# Patient Record
Sex: Female | Born: 1965 | Race: Black or African American | Hispanic: No | Marital: Married | Smoking: Never smoker
Health system: Southern US, Community
[De-identification: ages and names within clinical notes are randomized; demographics above are authoritative.]

## PROBLEM LIST (undated history)

## (undated) DIAGNOSIS — J40 Bronchitis, not specified as acute or chronic: Secondary | ICD-10-CM

## (undated) DIAGNOSIS — F419 Anxiety disorder, unspecified: Secondary | ICD-10-CM

## (undated) DIAGNOSIS — K219 Gastro-esophageal reflux disease without esophagitis: Secondary | ICD-10-CM

## (undated) DIAGNOSIS — M1990 Unspecified osteoarthritis, unspecified site: Secondary | ICD-10-CM

## (undated) DIAGNOSIS — J449 Chronic obstructive pulmonary disease, unspecified: Secondary | ICD-10-CM

## (undated) DIAGNOSIS — G709 Myoneural disorder, unspecified: Secondary | ICD-10-CM

## (undated) DIAGNOSIS — I10 Essential (primary) hypertension: Secondary | ICD-10-CM

## (undated) DIAGNOSIS — E669 Obesity, unspecified: Secondary | ICD-10-CM

## (undated) DIAGNOSIS — G4730 Sleep apnea, unspecified: Secondary | ICD-10-CM

## (undated) DIAGNOSIS — T7840XA Allergy, unspecified, initial encounter: Secondary | ICD-10-CM

## (undated) DIAGNOSIS — E119 Type 2 diabetes mellitus without complications: Secondary | ICD-10-CM

## (undated) DIAGNOSIS — F32A Depression, unspecified: Secondary | ICD-10-CM

## (undated) HISTORY — DX: Sleep apnea, unspecified: G47.30

## (undated) HISTORY — DX: Anxiety disorder, unspecified: F41.9

## (undated) HISTORY — DX: Essential (primary) hypertension: I10

## (undated) HISTORY — DX: Chronic obstructive pulmonary disease, unspecified: J44.9

## (undated) HISTORY — DX: Type 2 diabetes mellitus without complications: E11.9

## (undated) HISTORY — DX: Unspecified osteoarthritis, unspecified site: M19.90

## (undated) HISTORY — DX: Gastro-esophageal reflux disease without esophagitis: K21.9

## (undated) HISTORY — DX: Myoneural disorder, unspecified: G70.9

## (undated) HISTORY — DX: Obesity, unspecified: E66.9

## (undated) HISTORY — PX: DENTAL SURGERY: SHX609

## (undated) HISTORY — DX: Bronchitis, not specified as acute or chronic: J40

## (undated) HISTORY — DX: Allergy, unspecified, initial encounter: T78.40XA

## (undated) HISTORY — DX: Depression, unspecified: F32.A

---

## 1998-09-08 ENCOUNTER — Emergency Department (HOSPITAL_COMMUNITY): Admission: EM | Admit: 1998-09-08 | Discharge: 1998-09-08 | Payer: Self-pay | Admitting: Emergency Medicine

## 2000-03-03 ENCOUNTER — Other Ambulatory Visit: Admission: RE | Admit: 2000-03-03 | Discharge: 2000-03-03 | Payer: Self-pay | Admitting: Obstetrics and Gynecology

## 2000-03-03 ENCOUNTER — Other Ambulatory Visit: Admission: RE | Admit: 2000-03-03 | Discharge: 2000-03-03 | Payer: Self-pay | Admitting: Orthopedic Surgery

## 2002-05-04 ENCOUNTER — Other Ambulatory Visit: Admission: RE | Admit: 2002-05-04 | Discharge: 2002-05-04 | Payer: Self-pay | Admitting: Obstetrics and Gynecology

## 2003-03-24 ENCOUNTER — Emergency Department (HOSPITAL_COMMUNITY): Admission: EM | Admit: 2003-03-24 | Discharge: 2003-03-24 | Payer: Self-pay | Admitting: Emergency Medicine

## 2003-03-27 ENCOUNTER — Emergency Department (HOSPITAL_COMMUNITY): Admission: EM | Admit: 2003-03-27 | Discharge: 2003-03-27 | Payer: Self-pay | Admitting: Emergency Medicine

## 2003-09-14 ENCOUNTER — Other Ambulatory Visit: Admission: RE | Admit: 2003-09-14 | Discharge: 2003-09-14 | Payer: Self-pay | Admitting: Obstetrics and Gynecology

## 2004-03-26 ENCOUNTER — Other Ambulatory Visit: Admission: RE | Admit: 2004-03-26 | Discharge: 2004-03-26 | Payer: Self-pay | Admitting: Obstetrics and Gynecology

## 2004-10-14 HISTORY — PX: ECTOPIC PREGNANCY SURGERY: SHX613

## 2004-12-07 ENCOUNTER — Encounter: Payer: Self-pay | Admitting: Emergency Medicine

## 2004-12-07 ENCOUNTER — Encounter (INDEPENDENT_AMBULATORY_CARE_PROVIDER_SITE_OTHER): Payer: Self-pay | Admitting: Specialist

## 2004-12-07 ENCOUNTER — Ambulatory Visit (HOSPITAL_COMMUNITY): Admission: AD | Admit: 2004-12-07 | Discharge: 2004-12-07 | Payer: Self-pay | Admitting: Obstetrics and Gynecology

## 2004-12-20 ENCOUNTER — Ambulatory Visit: Payer: Self-pay | Admitting: Obstetrics and Gynecology

## 2004-12-28 ENCOUNTER — Other Ambulatory Visit: Admission: RE | Admit: 2004-12-28 | Discharge: 2004-12-28 | Payer: Self-pay | Admitting: Obstetrics and Gynecology

## 2005-07-15 ENCOUNTER — Ambulatory Visit: Payer: Self-pay | Admitting: Family Medicine

## 2006-03-03 ENCOUNTER — Ambulatory Visit: Payer: Self-pay | Admitting: Family Medicine

## 2006-03-20 ENCOUNTER — Other Ambulatory Visit: Admission: RE | Admit: 2006-03-20 | Discharge: 2006-03-20 | Payer: Self-pay | Admitting: Family Medicine

## 2006-03-20 ENCOUNTER — Encounter (INDEPENDENT_AMBULATORY_CARE_PROVIDER_SITE_OTHER): Payer: Self-pay | Admitting: *Deleted

## 2006-03-20 ENCOUNTER — Ambulatory Visit: Payer: Self-pay | Admitting: Family Medicine

## 2006-10-28 ENCOUNTER — Emergency Department (HOSPITAL_COMMUNITY): Admission: EM | Admit: 2006-10-28 | Discharge: 2006-10-28 | Payer: Self-pay | Admitting: Emergency Medicine

## 2007-09-17 ENCOUNTER — Emergency Department (HOSPITAL_COMMUNITY): Admission: EM | Admit: 2007-09-17 | Discharge: 2007-09-17 | Payer: Self-pay | Admitting: Emergency Medicine

## 2009-03-08 ENCOUNTER — Ambulatory Visit (HOSPITAL_COMMUNITY): Admission: RE | Admit: 2009-03-08 | Discharge: 2009-03-08 | Payer: Self-pay | Admitting: Obstetrics

## 2009-03-21 ENCOUNTER — Ambulatory Visit (HOSPITAL_COMMUNITY): Admission: RE | Admit: 2009-03-21 | Discharge: 2009-03-21 | Payer: Self-pay | Admitting: Obstetrics

## 2009-04-14 ENCOUNTER — Encounter: Admission: RE | Admit: 2009-04-14 | Discharge: 2009-04-14 | Payer: Self-pay | Admitting: Obstetrics

## 2010-05-28 ENCOUNTER — Ambulatory Visit: Payer: Self-pay | Admitting: Vascular Surgery

## 2010-11-05 ENCOUNTER — Encounter: Payer: Self-pay | Admitting: Obstetrics

## 2010-11-09 ENCOUNTER — Emergency Department (HOSPITAL_COMMUNITY)
Admission: EM | Admit: 2010-11-09 | Discharge: 2010-11-09 | Payer: Self-pay | Source: Home / Self Care | Admitting: Family Medicine

## 2011-02-26 NOTE — Procedures (Signed)
LOWER EXTREMITY VENOUS REFLUX EXAM   INDICATION:  Rule out venous insufficiency.   EXAM:  Using color-flow imaging and pulse Doppler spectral analysis, the  bilateral common femoral, superficial femoral, popliteal, posterior  tibial, greater and lesser saphenous veins are evaluated.  There is  evidence suggesting focal deep venous insufficiency in the bilateral  common femoral veins.   The bilateral saphenofemoral junctions and right GSV are competent.  The  left GSV is not competent at the knee and proximal calf levels with  reflux of >500 milliseconds and calibers as described below.   The right proximal short saphenous vein demonstrates competency.  The  left proximal short saphenous vein demonstrates incompetency with reflux  of >500 milliseconds and diameter measurements ranging from 0.21 to 0.29  cm.   GSV Diameter (used if found to be incompetent only)                                            Right    Left  Proximal Greater Saphenous Vein           cm       0.53 cm  Proximal-to-mid-thigh                     cm       cm  Mid thigh                                 cm       0.45 cm  Mid-distal thigh                          cm       cm  Distal thigh                              cm       0.36 cm  Knee                                      cm       0.31 cm   IMPRESSION:  1. The right greater saphenous vein is competent.  The left greater      saphenous vein is not competent, as described above.  2. The bilateral greater saphenous veins are not tortuous.  3. Focal deep venous reflux is noted, as described above.  4. The right short saphenous vein is competent.  The left short      saphenous vein is not competent, as described above.         ___________________________________________  Quita Skye. Hart Rochester, M.D.   CH/MEDQ  D:  05/28/2010  T:  05/28/2010  Job:  [id number]

## 2011-02-26 NOTE — Consult Note (Signed)
NEW PATIENT CONSULTATION   [name], [name]  DOB:  [birth year]                                       05/28/2010  EAVWU#:[id number]   Patient is a 45 year old female referred for painful varicosities in the  left lateral leg.  She states that this bulging varicosity between the  ankle and the knee on the lateral aspect has been present for the last  few years but increased in size and increased in symptomatology.  She  describes an aching, throbbing, burning discomfort as the day  progresses, worsened by standing and relieved slightly by elevation.  She does not wear elastic compression stockings and does develop  swelling in the ankle as the day progresses.  She has no history of  thrombophlebitis, deep vein thrombosis, stasis ulcers or bleeding.  She  does not take pain medication for this.   CHRONIC MEDICAL PROBLEMS:  1. Asthma.  2. Negative for coronary artery disease, diabetes, hypertension,      hyperlipidemia, stroke.   FAMILY HISTORY:  Positive for coronary artery disease in her own father,  stroke in her grandfather.  Negative for diabetes.   SOCIAL HISTORY:  She is married, has 2 children, works at [employer], is on her feet most of the day.  She does not use tobacco or  alcohol.   REVIEW OF SYSTEMS:  Positive for weight gain, occasional dyspnea while  at rest, chronic bronchitis, asthma with wheezing, dysphagia, occasional  urinary frequency, headaches, joint pain, particularly in the knees,  which buckle at times.  All other systems and review of systems are  negative.   PHYSICAL EXAMINATION:  Blood pressure 120/80, heart rate 74,  respirations 16.  General:  She is an obese middle-aged female in no  apparent distress, well-developed and well-nourished, alert and oriented  x3.  HEENT:  Normal for age.  EOMs intact.  Lungs:  Clear to  auscultation.  No rhonchi or wheezing.  Cardiovascular:  Regular rhythm.  No murmurs.  Carotid pulses are  3+.  No audible bruits.  Abdomen:  Obese.  No palpable masses.  Musculoskeletal:  Free of major  deformities.  Neurologic:  Normal.  Skin:  Free of rashes.  Lower  extremity exam reveals 3+ femoral, popliteal, and dorsalis pedis pulses  palpable bilaterally.  Both feet are well-perfused.  She has diffuse  spider veins in the left more than right in the medial and lateral thigh  area with some small reticular veins in the lateral thigh.  She has 2  isolated prominent varicosities in the lower lateral calf about 4 inches  proximal to the ankle, lateral malleolus.  There is no hyperpigmentation  or ulceration, but she does have mild edema in the left leg.   Today I ordered a venous duplex exam which I have reviewed and  interpreted.  The deep system has some mild common femoral vein reflux.  There is no DVT.  She does have some reflux in the great saphenous vein  at about the knee level.  Small saphenous vein has reflux but is not a  very large vessel, and I suspect this is responsible for the isolated  varix.   I described with her the options, including stab phlebectomies.  She is  not interested in treatment at this time but will return if she should  decide she would  like treatment.  I have suggested short-leg elastic  compression stockings to compress this during the daytime to affect her  symptomatology.     Quita Skye Hart Rochester, M.D.  Electronically Signed   JDL/MEDQ  D:  05/28/2010  T:  05/28/2010  Job:  [id number]

## 2011-03-01 NOTE — Op Note (Signed)
[name], [name]                ACCOUNT NO.:  [id number]   MEDICAL RECORD NO.:  [id number]          PATIENT TYPE:  AMB   LOCATION:  [facility]                           FACILITY:  [facility]   PHYSICIAN:  Zenaida Niece, M.D.DATE OF BIRTH:  15-May-1966   DATE OF PROCEDURE:  12/07/2004  DATE OF DISCHARGE:  12/07/2004                                 OPERATIVE REPORT   PREOPERATIVE DIAGNOSES:  Right ectopic pregnancy.   POSTOPERATIVE DIAGNOSES:  Right ectopic pregnancy.   PROCEDURES:  Laparoscopy with right salpingectomy.   SURGEON:  Zenaida Niece, M.D.   ANESTHESIA:  General.   SPECIMENS:  Right fallopian tube with ectopic pregnancy.   ESTIMATED BLOOD LOSS:  1000 mL.   FINDINGS:  She had a large amount of clot approximately 1000 mL in the  abdomen and pelvis. There was an obvious right ruptured ectopic pregnancy  with otherwise normal anatomy.   PROCEDURE IN DETAIL:  The patient was taken to the operating room and placed  in the dorsosupine position. General anesthesia was induced and she was  placed in mobile stirrups. The abdomen was prepped and draped in the usual  sterile fashion for a laparoscopic procedure, Foley catheter was inserted,  and a Hulka tenaculum applied to the cervix for uterine manipulation.  Infraumbilical skin was then infiltrated with 0.25% percent Marcaine and a 1-  1/2 cm horizontal incision was made. The Veress needle was inserted into the  peritoneal cavity and placement confirmed by the water drop test and an  opening pressure of 3 mmHg. CO2 gas was insufflated to a pressure of 12 mmHg  and the Veress needle was removed. A size 11 disposable trocar was then  introduced and placement confirmed by the laparoscope. There was a large  amount of clot in the pelvis and abdomen. A 5 mm port was placed under  direct visualization in the left lower quadrant. I attempted to use the 5 mm  Nezhat suction irrigator to remove this clot, but was unable to suck up  most  of the clot into the irrigator. The 5 mm trocar was removed and a size 11  trocar was placed through this similar site and a larger suction irrigator  was placed. Using this, I was able to evacuate most of the clot with  irrigation. I was finally able to see the pelvis and there was an obvious  rupture site in the right tube. This did have some steady bleeding. I did  not feel that this tube was salvageable. A #0 Vicryl Endoloop was passed  around the tube and secured tightly. The tube was then removed sharply and  placed in the anterior cul-de-sac. The site was then inspected and found to  be hemostatic. The tube was then placed in an endobag and removed through  the umbilical port. Again this did appear to have a rupture site and some  remnants of an ectopic pregnancy. The pelvis was then again irrigated and as  much clot and debris was removed as possible. The tubal site was again  inspected and found to be hemostatic. The  left lower quadrant port was  removed under direct visualization and found to be hemostatic. The  laparoscope was then removed and all gas allowed to deflate from the  abdomen. Skin incisions were closed with interrupted subcuticular sutures of  4-0 Vicryl followed by Steri-Strips and Band-Aids. The Foley catheter and  Hulka tenaculum were then removed. The patient tolerated the procedure well  and was taken to recovery in stable condition. Counts were correct.      TDM/MEDQ  D:  12/20/2004  T:  12/20/2004  Job:  [id number]

## 2011-06-11 ENCOUNTER — Inpatient Hospital Stay (INDEPENDENT_AMBULATORY_CARE_PROVIDER_SITE_OTHER)
Admission: RE | Admit: 2011-06-11 | Discharge: 2011-06-11 | Disposition: A | Discharge status: Home / Self Care | Payer: BC Managed Care – PPO | Source: Ambulatory Visit | Attending: Emergency Medicine | Admitting: Emergency Medicine

## 2011-06-11 DIAGNOSIS — J45909 Unspecified asthma, uncomplicated: Secondary | ICD-10-CM

## 2011-07-09 ENCOUNTER — Encounter: Payer: Self-pay | Admitting: Family Medicine

## 2011-07-09 ENCOUNTER — Ambulatory Visit (INDEPENDENT_AMBULATORY_CARE_PROVIDER_SITE_OTHER): Payer: BC Managed Care – PPO | Admitting: Family Medicine

## 2011-07-09 DIAGNOSIS — G4733 Obstructive sleep apnea (adult) (pediatric): Secondary | ICD-10-CM | POA: Insufficient documentation

## 2011-07-09 DIAGNOSIS — R0609 Other forms of dyspnea: Secondary | ICD-10-CM

## 2011-07-09 DIAGNOSIS — Z205 Contact with and (suspected) exposure to viral hepatitis: Secondary | ICD-10-CM

## 2011-07-09 DIAGNOSIS — Z23 Encounter for immunization: Secondary | ICD-10-CM

## 2011-07-09 DIAGNOSIS — R0683 Snoring: Secondary | ICD-10-CM

## 2011-07-09 DIAGNOSIS — M549 Dorsalgia, unspecified: Secondary | ICD-10-CM

## 2011-07-09 DIAGNOSIS — Z0000 Encounter for general adult medical examination without abnormal findings: Secondary | ICD-10-CM | POA: Insufficient documentation

## 2011-07-09 LAB — HEPATITIS B SURFACE ANTIBODY,QUALITATIVE: Hep B S Ab: NEGATIVE

## 2011-07-09 NOTE — Progress Notes (Signed)
  Subjective:    Patient ID: [name], female    DOB: [birth year], 45 y.o.   MRN: [id number]  HPI New to establish.  Previous MD- [facility]herson at [facility].  GYN- Dr Senaida Ores.  UTD on pap, overdue on Mammo.  Did stool cards w/ GYN.  Desires CPE.  Recently had labs done.  Snoring- pt reports husband complains about this.  Ongoing problem.  Has never been told she stops breathing.  Sister has OSA- on CPAP.  Pt would like sleep study.  Back pain- pt reports shoulder, neck, and low back pain.  Admits to poor posture.  Pain is worse w/ prolonged sitting.  Feels this is due to her large breasts   Review of Systems Patient reports no vision/ hearing changes, adenopathy,fever, weight change,  persistant/recurrent hoarseness , swallowing issues, chest pain, palpitations, edema, persistant/recurrent cough, hemoptysis, dyspnea (rest/exertional/paroxysmal nocturnal), gastrointestinal bleeding (melena, rectal bleeding), abdominal pain, significant heartburn, bowel changes, GU symptoms (dysuria, hematuria, incontinence), Gyn symptoms (abnormal  bleeding, pain),  syncope, focal weakness, memory loss, numbness & tingling, skin/hair/nail changes, abnormal bruising or bleeding, anxiety, or depression.     Objective:   Physical Exam  General Appearance:    Alert, cooperative, no distress, appears stated age  Head:    Normocephalic, without obvious abnormality, atraumatic  Eyes:    PERRL, conjunctiva/corneas clear, EOM's intact, fundi    benign, both eyes  Ears:    Normal TM's and external ear canals, both ears  Nose:   Nares normal, septum midline, mucosa normal, no drainage    or sinus tenderness  Throat:   Lips, mucosa, and tongue normal; teeth and gums normal  Neck:   Supple, symmetrical, trachea midline, no adenopathy;    Thyroid: no enlargement/tenderness/nodules  Back:     Symmetric, no curvature, ROM normal, no CVA tenderness  Lungs:     Clear to auscultation bilaterally, respirations unlabored   Chest Wall:    No tenderness or deformity   Heart:    Regular rate and rhythm, S1 and S2 normal, no murmur, rub   or gallop  Breast Exam:    Deferred to GYN  Abdomen:     Soft, non-tender, bowel sounds active all four quadrants,    no masses, no organomegaly  Genitalia:    Deferred to GYN  Rectal:    Extremities:   Extremities normal, atraumatic, no cyanosis or edema  Pulses:   2+ and symmetric all extremities  Skin:   Skin color, texture, turgor normal, no rashes or lesions  Lymph nodes:   Cervical, supraclavicular, and axillary nodes normal  Neurologic:   CNII-XII intact, normal strength, sensation and reflexes    throughout          Assessment & Plan:

## 2011-07-09 NOTE — Patient Instructions (Signed)
Once we have your lab results to review we'll notify you if anything is missing We'll notify you of your lab results and determine whether you need the Hepatitis vaccine Keep up the good work on healthy food choices and regular exercise Call with any questions or concerns Welcome!  We're glad to have you!

## 2011-07-11 ENCOUNTER — Telehealth: Payer: Self-pay

## 2011-07-11 NOTE — Telephone Encounter (Signed)
Message copied by [name] on Thu Jul 11, 2011  3:17 PM ------      Message from: Sheliah Hatch      Created: Wed Jul 10, 2011  8:04 AM       Surface antibody is negative meaning she has not had Hep B vaccine- would recommend this if she is interested

## 2011-07-11 NOTE — Progress Notes (Signed)
Quick Note:  Left message to notify pt of results ______

## 2011-07-11 NOTE — Telephone Encounter (Signed)
Left message to notify pt of results. Advised pt to schedule nurse visit for vaccine if she is interested

## 2011-07-13 NOTE — Assessment & Plan Note (Signed)
Pt w/ poor posture and very large breasts.  This is likely the cause of her pain.  No TTP over spine- discomfort is likely muscular.  NSAIDs prn, heat, work on better posture.

## 2011-07-13 NOTE — Assessment & Plan Note (Signed)
Husband frequently complains, sister has OSA.  Will refer to pulm for evaluation.

## 2011-07-13 NOTE — Assessment & Plan Note (Signed)
Coworker has been dx'd- pt would like tested.  Unsure whether she's been vaccinated.

## 2011-07-13 NOTE — Assessment & Plan Note (Addendum)
Pt's PE WNL w/ exception of obesity.  UTD on GYN.  Reports labs recently done.  Will attempt to get these to review. EKG done- see document for interpretation.  Anticipatory guidance provided.

## 2011-07-23 ENCOUNTER — Institutional Professional Consult (permissible substitution): Payer: BC Managed Care – PPO | Admitting: Pulmonary Disease

## 2011-09-20 ENCOUNTER — Encounter: Payer: Self-pay | Admitting: Family Medicine

## 2011-09-20 ENCOUNTER — Ambulatory Visit (INDEPENDENT_AMBULATORY_CARE_PROVIDER_SITE_OTHER)
Admission: RE | Admit: 2011-09-20 | Discharge: 2011-09-20 | Disposition: A | Discharge status: Home / Self Care | Payer: BC Managed Care – PPO | Source: Ambulatory Visit | Attending: Family Medicine | Admitting: Family Medicine

## 2011-09-20 ENCOUNTER — Ambulatory Visit (INDEPENDENT_AMBULATORY_CARE_PROVIDER_SITE_OTHER): Payer: BC Managed Care – PPO | Admitting: Family Medicine

## 2011-09-20 VITALS — BP 132/86 | HR 100 | Temp 99.3°F | Wt 275.0 lb

## 2011-09-20 DIAGNOSIS — R062 Wheezing: Secondary | ICD-10-CM

## 2011-09-20 DIAGNOSIS — J40 Bronchitis, not specified as acute or chronic: Secondary | ICD-10-CM

## 2011-09-20 MED ORDER — PREDNISONE 10 MG PO TABS: ORAL_TABLET | ORAL | Status: DC

## 2011-09-20 MED ORDER — ALBUTEROL SULFATE HFA 108 (90 BASE) MCG/ACT IN AERS: 2.0000 | INHALATION_SPRAY | Freq: Two times a day (BID) | RESPIRATORY_TRACT | Status: DC

## 2011-09-20 MED ORDER — COMPRESSOR/NEBULIZER MISC: Status: AC

## 2011-09-20 MED ORDER — BECLOMETHASONE DIPROPIONATE 80 MCG/ACT IN AERS: 2.0000 | INHALATION_SPRAY | Freq: Every day | RESPIRATORY_TRACT | Status: DC

## 2011-09-20 MED ORDER — METHYLPREDNISOLONE ACETATE PF 80 MG/ML IJ SUSP
80.0000 mg | Freq: Once | INTRAMUSCULAR | Status: AC
  Administered 2011-09-20: 80 mg via INTRAMUSCULAR

## 2011-09-20 MED ORDER — AMOXICILLIN-POT CLAVULANATE 875-125 MG PO TABS: 1.0000 | ORAL_TABLET | Freq: Two times a day (BID) | ORAL | Status: AC

## 2011-09-20 MED ORDER — ALBUTEROL SULFATE (2.5 MG/3ML) 0.083% IN NEBU: 2.5000 mg | INHALATION_SOLUTION | Freq: Four times a day (QID) | RESPIRATORY_TRACT | Status: DC | PRN

## 2011-09-20 MED ORDER — ALBUTEROL SULFATE (5 MG/ML) 0.5% IN NEBU
2.5000 mg | INHALATION_SOLUTION | Freq: Once | RESPIRATORY_TRACT | Status: AC
  Administered 2011-09-20: 2.5 mg via RESPIRATORY_TRACT

## 2011-09-20 NOTE — Patient Instructions (Signed)

## 2011-09-20 NOTE — Progress Notes (Signed)
  Subjective:     [name] is a 45 y.o. female who presents for evaluation of symptoms of a URI. Symptoms include achiness, congestion, fever --felt hot , didn't take temp, nasal congestion, productive cough with  yellow colored sputum, purulent nasal discharge, shortness of breath and wheezing. Onset of symptoms was 4 days ago, and has been gradually worsening since that time. Treatment to date: cough suppressants and nyquil.  The following portions of the patient's history were reviewed and updated as appropriate: allergies, current medications, past family history, past medical history, past social history, past surgical history and problem list.  Review of Systems Pertinent items are noted in HPI.   Objective:    BP 132/86  Pulse 100  Temp(Src) 99.3 F (37.4 C) (Oral)  Wt 275 lb (124.739 kg)  SpO2 97% General appearance: alert, cooperative, appears stated age and no distress Eyes: conjunctivae/corneas clear. PERRL, EOM's intact. Fundi benign. Ears: normal TM's and external ear canals both ears Neck: no adenopathy, no carotid bruit, no JVD, supple, symmetrical, trachea midline and thyroid not enlarged, symmetric, no tenderness/mass/nodules Lungs: diminished breath sounds bilaterally and wheezes bilaterally Heart: regular rate and rhythm, S1, S2 normal, no murmur, click, rub or gallop   Assessment:    asthma and bronchitis   Plan:    Suggested symptomatic OTC remedies. Nasal saline spray for congestion. Augmentin per orders. Follow up as needed.

## 2011-11-13 ENCOUNTER — Encounter: Payer: Self-pay | Admitting: Family Medicine

## 2011-11-13 ENCOUNTER — Ambulatory Visit (INDEPENDENT_AMBULATORY_CARE_PROVIDER_SITE_OTHER): Payer: BC Managed Care – PPO | Admitting: Family Medicine

## 2011-11-13 VITALS — BP 140/84 | HR 104 | Temp 98.8°F | Wt 274.0 lb

## 2011-11-13 DIAGNOSIS — J40 Bronchitis, not specified as acute or chronic: Secondary | ICD-10-CM

## 2011-11-13 DIAGNOSIS — R0683 Snoring: Secondary | ICD-10-CM

## 2011-11-13 DIAGNOSIS — R062 Wheezing: Secondary | ICD-10-CM

## 2011-11-13 DIAGNOSIS — J45909 Unspecified asthma, uncomplicated: Secondary | ICD-10-CM

## 2011-11-13 DIAGNOSIS — R0609 Other forms of dyspnea: Secondary | ICD-10-CM

## 2011-11-13 MED ORDER — METHYLPREDNISOLONE ACETATE PF 80 MG/ML IJ SUSP
80.0000 mg | Freq: Once | INTRAMUSCULAR | Status: AC
  Administered 2011-11-13: 80 mg via INTRAMUSCULAR

## 2011-11-13 MED ORDER — AMOXICILLIN-POT CLAVULANATE 875-125 MG PO TABS: 1.0000 | ORAL_TABLET | Freq: Two times a day (BID) | ORAL | Status: AC

## 2011-11-13 MED ORDER — PREDNISONE 10 MG PO TABS: ORAL_TABLET | ORAL | Status: DC

## 2011-11-13 MED ORDER — ALBUTEROL SULFATE (5 MG/ML) 0.5% IN NEBU
2.5000 mg | INHALATION_SOLUTION | Freq: Once | RESPIRATORY_TRACT | Status: AC
  Administered 2011-11-13: 2.5 mg via RESPIRATORY_TRACT

## 2011-11-13 NOTE — Patient Instructions (Signed)

## 2011-11-13 NOTE — Progress Notes (Signed)
  Subjective:     [name] is a 46 y.o. female here for evaluation of a cough. Onset of symptoms was 3 days ago. Symptoms have been gradually worsening since that time. The cough is productive and is aggravated by exercise and reclining position. Associated symptoms include: shortness of breath, sputum production and wheezing. Patient does have a history of asthma. Patient does not have a history of environmental allergens. Patient has not traveled recently. Patient does not have a history of smoking. Patient has had a previous chest x-ray. Patient has not had a PPD done.  The following portions of the patient's history were reviewed and updated as appropriate: allergies, current medications, past family history, past medical history, past social history, past surgical history and problem list.  Review of Systems Pertinent items are noted in HPI.    Objective:    Oxygen saturation 96% on room air BP 140/84  Pulse 104  Temp(Src) 98.8 F (37.1 C) (Oral)  Wt 274 lb (124.286 kg)  SpO2 96% General appearance: alert, cooperative, appears stated age and mild distress Ears: normal TM's and external ear canals both ears Nose: green discharge, moderate congestion, sinus tenderness bilateral Throat: lips, mucosa, and tongue normal; teeth and gums normal Neck: mild anterior cervical adenopathy, supple, symmetrical, trachea midline and thyroid not enlarged, symmetric, no tenderness/mass/nodules Lungs: rhonchi bilaterally and wheezes bilaterally    Assessment:    Acute Bronchitis and Sinusitis    Plan:  Augmentin for 10 days Depo medrol pred taper con't neb at home prn qvar

## 2011-11-19 ENCOUNTER — Other Ambulatory Visit: Payer: Self-pay

## 2011-11-19 NOTE — Telephone Encounter (Signed)
Msg from patient stating she had questions about her OV.     KP

## 2011-11-19 NOTE — Telephone Encounter (Signed)
Discussed with patient and she waned to know if she gave her Grandchild her cold.. I discussed OV and advised hand hygiene and making sure she washes her hands before contact with the baby. She voiced understanding     KP

## 2011-12-05 ENCOUNTER — Encounter: Payer: Self-pay | Admitting: Pulmonary Disease

## 2011-12-05 ENCOUNTER — Other Ambulatory Visit: Payer: BC Managed Care – PPO

## 2011-12-05 ENCOUNTER — Ambulatory Visit (INDEPENDENT_AMBULATORY_CARE_PROVIDER_SITE_OTHER): Payer: BC Managed Care – PPO | Admitting: Pulmonary Disease

## 2011-12-05 ENCOUNTER — Other Ambulatory Visit (INDEPENDENT_AMBULATORY_CARE_PROVIDER_SITE_OTHER): Payer: BC Managed Care – PPO

## 2011-12-05 VITALS — BP 122/80 | HR 69 | Temp 97.7°F | Ht 65.0 in | Wt 277.0 lb

## 2011-12-05 DIAGNOSIS — R0683 Snoring: Secondary | ICD-10-CM

## 2011-12-05 DIAGNOSIS — J45909 Unspecified asthma, uncomplicated: Secondary | ICD-10-CM

## 2011-12-05 DIAGNOSIS — R0609 Other forms of dyspnea: Secondary | ICD-10-CM

## 2011-12-05 LAB — CBC WITH DIFFERENTIAL/PLATELET
Basophils Absolute: 0 10*3/uL (ref 0.0–0.1)
Basophils Relative: 0.3 % (ref 0.0–3.0)
Eosinophils Absolute: 0.3 10*3/uL (ref 0.0–0.7)
Eosinophils Relative: 4.3 % (ref 0.0–5.0)
HCT: 36.9 % (ref 36.0–46.0)
Hemoglobin: 12.2 g/dL (ref 12.0–15.0)
Lymphocytes Relative: 33.1 % (ref 12.0–46.0)
Lymphs Abs: 2.4 10*3/uL (ref 0.7–4.0)
MCHC: 33.1 g/dL (ref 30.0–36.0)
MCV: 93.7 fl (ref 78.0–100.0)
Monocytes Absolute: 0.6 10*3/uL (ref 0.1–1.0)
Monocytes Relative: 7.7 % (ref 3.0–12.0)
Neutro Abs: 4 10*3/uL (ref 1.4–7.7)
Neutrophils Relative %: 54.6 % (ref 43.0–77.0)
Platelets: 260 10*3/uL (ref 150.0–400.0)
RBC: 3.94 Mil/uL (ref 3.87–5.11)
RDW: 14.6 % (ref 11.5–14.6)
WBC: 7.3 10*3/uL (ref 4.5–10.5)

## 2011-12-05 NOTE — Patient Instructions (Signed)
TRial of symbicort 160 2 puffs twice dail y- sample INSTEAD of Qvar - call us for Rx if this works Blood work today Sleep study Breathing test - spirometry pre/post prior to next appt

## 2011-12-05 NOTE — Assessment & Plan Note (Signed)
Given excessive daytime somnolence, narrow pharyngeal exam, witnessed apneas & loud snoring, obstructive sleep apnea is very likely & an overnight polysomnogram will be scheduled as a split study. The pathophysiology of obstructive sleep apnea , it's cardiovascular consequences & modes of treatment including CPAP were discused with the patient in detail & they evidenced understanding.  

## 2011-12-05 NOTE — Assessment & Plan Note (Signed)
Triggers include allergies, GERD, ? Chemicals at the hair salon. We will stepup therapy with inhaled steroid and lumbar combination since she is having breakthrough exacerbations on Qvar. Symbicort samples were given 160/4.5 2 puffs twice a day. She will continue to use albuterol rescue inhaler as needed, hopefully we can cut down use of rescue inhaler as be achieved better control. If her wheezing is persistent, Singulair can be added as a next step. Allergy testing RAST will be performed, CBC will be checked for eosinophilia.

## 2011-12-05 NOTE — Progress Notes (Signed)
  Subjective:    Patient ID: [name], female    DOB: 1966/02/06, 46 y.o.   MRN: [id number]  HPI 46 year old never smoker presents for evaluation of asthma and obstructive sleep apnea. Asthma was diagnosed in 2006. Her triggers include uri and weather changes. She has seasonal allergies and her sinuses act up in fall. She takes over-the-counter Zyrtec .She reports intermittent heartburn controlled by over-the-counter medications. She is now maintained on a regimen of Qvar, albuterol MDI and nebs. She had frequent flares over the last year, was following with urgent care and has now established with Dr. Laury Axon. Her last flare was on 11/13/2011 requiring IM Solu-Medrol, prednisone. 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 Chest x-ray on 09/20/2011 showed chronic bronchitic changes. She has not had formal allergy testing. Epworth sleepiness score is 11/24. Husband has noted loud snoring but not witnessed apneas. Bedtime is 10 PM to midnight, sleep latency is minimal, she sleeps on her stomach with 2 pillows, she is 2-3 nocturnal awakenings including bathroom visits, she is out of bed at 7 AM feeling tired, denies headaches or dryness of mouth. There is no history suggestive of cataplexy, sleep paralysis or parasomnias  After an exacerbation in December 2012, she was placed on home oxygen which she uses as needed during a flare.   Review of Systems  Constitutional: Negative for fever and unexpected weight change.  HENT: Positive for congestion, sore throat, sneezing and dental problem. Negative for ear pain, nosebleeds, rhinorrhea, trouble swallowing, postnasal drip and sinus pressure.   Eyes: Negative for redness and itching.  Respiratory: Positive for cough and shortness of breath. Negative for chest tightness and wheezing.   Cardiovascular: Negative for palpitations and leg swelling.  Gastrointestinal: Positive for abdominal pain. Negative for nausea and vomiting.  Genitourinary:  Negative for dysuria.  Musculoskeletal: Negative for joint swelling.  Skin: Negative for rash.  Neurological: Negative for headaches.  Hematological: Does not bruise/bleed easily.  Psychiatric/Behavioral: Negative for dysphoric mood. The patient is not nervous/anxious.        Objective:   Physical Exam  Gen. Pleasant, obese, in no distress, normal affect ENT - no lesions, no post nasal drip, class 2-3 airway Neck: No JVD, no thyromegaly, no carotid bruits Lungs: no use of accessory muscles, no dullness to percussion, decreased without rales or rhonchi  Cardiovascular: Rhythm regular, heart sounds  normal, no murmurs or gallops, no peripheral edema Abdomen: soft and non-tender, no hepatosplenomegaly, BS normal. Musculoskeletal: No deformities, no cyanosis or clubbing Neuro:  alert, non focal, no tremors        Assessment & Plan:

## 2011-12-06 LAB — ALLERGY FULL PROFILE
Allergen, D pternoyssinus,d7: 0.6 kU/L — ABNORMAL HIGH (ref <0.35)
Bahia Grass: <0.1 kU/L (ref <0.35)
Box Elder IgE: <0.1 kU/L (ref <0.35)
Curvularia lunata: <0.1 kU/L (ref <0.35)
Elm IgE: <0.1 kU/L (ref <0.35)
Fescue: <0.1 kU/L (ref <0.35)
G005 Rye, Perennial: <0.1 kU/L (ref <0.35)
G009 Red Top: <0.1 kU/L (ref <0.35)
Goldenrod: <0.1 kU/L (ref <0.35)
Oak: <0.1 kU/L (ref <0.35)
Sycamore Tree: <0.1 kU/L (ref <0.35)

## 2011-12-16 ENCOUNTER — Encounter: Payer: Self-pay | Admitting: Family Medicine

## 2011-12-16 ENCOUNTER — Ambulatory Visit (INDEPENDENT_AMBULATORY_CARE_PROVIDER_SITE_OTHER): Payer: BC Managed Care – PPO | Admitting: Family Medicine

## 2011-12-16 DIAGNOSIS — J329 Chronic sinusitis, unspecified: Secondary | ICD-10-CM

## 2011-12-16 DIAGNOSIS — J309 Allergic rhinitis, unspecified: Secondary | ICD-10-CM

## 2011-12-16 DIAGNOSIS — H109 Unspecified conjunctivitis: Secondary | ICD-10-CM

## 2011-12-16 DIAGNOSIS — J302 Other seasonal allergic rhinitis: Secondary | ICD-10-CM

## 2011-12-16 MED ORDER — BECLOMETHASONE DIPROPIONATE 80 MCG/ACT NA AERS: 2.0000 | INHALATION_SPRAY | Freq: Every day | NASAL | Status: DC

## 2011-12-16 MED ORDER — MONTELUKAST SODIUM 10 MG PO TABS: 10.0000 mg | ORAL_TABLET | Freq: Every day | ORAL | Status: DC

## 2011-12-16 MED ORDER — BENZONATATE 200 MG PO CAPS: 200.0000 mg | ORAL_CAPSULE | Freq: Three times a day (TID) | ORAL | Status: AC | PRN

## 2011-12-16 MED ORDER — AMOXICILLIN 875 MG PO TABS: 875.0000 mg | ORAL_TABLET | Freq: Two times a day (BID) | ORAL | Status: DC

## 2011-12-16 NOTE — Progress Notes (Signed)
  Subjective:    Patient ID: [name], female    DOB: 1965/11/01, 46 y.o.   MRN: [id number]  HPI URI- sxs started Friday w/ nasal congestion, 'i can't stop blowing my nose'.  + sore throat, bilateral ear pain, + facial pain, HA.  No fevers.  + sick contacts.  + cough- productive.  + hx of seasonal allergies- Started OTC allergy tab last night.  L eye red, itchy, irritated.  Denies drainage.  No pain or visual changes.  No N/V/D.   Review of Systems For ROS see HPI     Objective:   Physical Exam  Vitals reviewed. Constitutional: She appears well-developed and well-nourished. No distress.  HENT:  Head: Normocephalic and atraumatic.  Right Ear: Tympanic membrane normal.  Left Ear: Tympanic membrane normal.  Nose: Mucosal edema and rhinorrhea present. Right sinus exhibits maxillary sinus tenderness and frontal sinus tenderness. Left sinus exhibits maxillary sinus tenderness and frontal sinus tenderness.  Mouth/Throat: Uvula is midline and mucous membranes are normal. Posterior oropharyngeal erythema present. No oropharyngeal exudate.  Eyes: EOM are normal. Pupils are equal, round, and reactive to light.       L conjunctival erythema/injxn  Neck: Normal range of motion. Neck supple.  Cardiovascular: Normal rate, regular rhythm and normal heart sounds.   Pulmonary/Chest: Effort normal and breath sounds normal. No respiratory distress. She has no wheezes.       + hacking cough  Lymphadenopathy:    She has no cervical adenopathy.          Assessment & Plan:

## 2011-12-16 NOTE — Patient Instructions (Signed)
This is a sinus infection, allergies, and pink eye Start the Amoxicillin twice daily (w/ food) Start the nasal spray- 2 sprays each nostril daily- to decrease congestion and post nasal drip Take the Singulair daily for the allergies Add Mucinex to thin your congestion Use the cough pills as needed REST! Hang in there!

## 2011-12-17 NOTE — Assessment & Plan Note (Signed)
New.  Likely combo of viral/allergic.  Systemic abx will tx any bacterial sxs.  Reviewed supportive care and red flags that should prompt return.  Pt expressed understanding and is in agreement w/ plan.

## 2011-12-17 NOTE — Assessment & Plan Note (Signed)
New.  Pt's sxs poorly controlled and likely the cause of her current sinus infxn.  Start nasal steroid, singulair, and OTC antihistamine.  Reviewed supportive care and red flags that should prompt return.  Pt expressed understanding and is in agreement w/ plan.

## 2011-12-17 NOTE — Assessment & Plan Note (Signed)
New.  Start abx.  Reviewed supportive care and red flags that should prompt return.  Pt expressed understanding and is in agreement w/ plan.  

## 2011-12-18 ENCOUNTER — Telehealth: Payer: Self-pay | Admitting: *Deleted

## 2011-12-18 NOTE — Telephone Encounter (Signed)
Pt must have tried and failed fluticasone,flunisolide or triamcinolone in order for Qnasl to be approved.Please advise

## 2011-12-18 NOTE — Telephone Encounter (Signed)
Left message to call office

## 2011-12-18 NOTE — Telephone Encounter (Signed)
Please call pt and ask if she has taken any of these meds- if not, will need script for flonase 2 sprays each nostril daily

## 2011-12-19 MED ORDER — FLUTICASONE PROPIONATE 50 MCG/ACT NA SUSP: 2.0000 | Freq: Every day | NASAL | Status: DC

## 2011-12-19 NOTE — Telephone Encounter (Signed)
Pt states that she thinks that she may have tried Nasonex in the pass but she is unsure. New Rx sent to pharmacy and Pt advised to let us know if med is not working or if she has a reaction, Pt ok verbalized understanding. Pt indicated that she was given sample of Qnasl will use that then fill new Rx.

## 2011-12-19 NOTE — Telephone Encounter (Signed)
Left message to call office

## 2011-12-27 ENCOUNTER — Ambulatory Visit (INDEPENDENT_AMBULATORY_CARE_PROVIDER_SITE_OTHER): Payer: BC Managed Care – PPO | Admitting: Family Medicine

## 2011-12-27 ENCOUNTER — Telehealth: Payer: Self-pay | Admitting: *Deleted

## 2011-12-27 ENCOUNTER — Encounter: Payer: Self-pay | Admitting: Family Medicine

## 2011-12-27 VITALS — BP 124/80 | HR 80 | Temp 98.4°F | Ht 64.75 in | Wt 278.6 lb

## 2011-12-27 DIAGNOSIS — L03317 Cellulitis of buttock: Secondary | ICD-10-CM

## 2011-12-27 DIAGNOSIS — L0231 Cutaneous abscess of buttock: Secondary | ICD-10-CM

## 2011-12-27 MED ORDER — CEPHALEXIN 500 MG PO CAPS: 500.0000 mg | ORAL_CAPSULE | Freq: Two times a day (BID) | ORAL | Status: AC

## 2011-12-27 NOTE — Patient Instructions (Signed)
This appears to be a cellulitis/skin infection Start the Keflex twice daily Apply ice to improve the swelling Call with any questions or concerns Hang in there!!!

## 2011-12-27 NOTE — Assessment & Plan Note (Signed)
New.  Unlikely that this is from injxn given 6 weeks ago although it is in similar area.  Pt may have been rubbing area which subsequently caused infxn.  Start keflex.  Reviewed supportive care and red flags that should prompt return.  Pt expressed understanding and is in agreement w/ plan.

## 2011-12-27 NOTE — Progress Notes (Signed)
  Subjective:    Patient ID: [name], female    DOB: 11-Dec-1965, 46 y.o.   MRN: [id number]  HPI Redness at injxn site-got injxn on 1/30 of Depo Medrol.  Pt reports area remains tender to touch, last night thought it appeared red.  Feels area remains swollen.  L buttock.   Review of Systems For ROS see HPI     Objective:   Physical Exam  Vitals reviewed. Constitutional: She appears well-developed and well-nourished. No distress.  Skin: Skin is warm and dry. There is erythema (induration w/ surrounding redness of L upper outer buttock.  skin is warm to touch).          Assessment & Plan:

## 2011-12-27 NOTE — Telephone Encounter (Signed)
Pt called left msg on triage vmail stating she had a shot in the hip about a month ago & it is now red & sore to touch. She would like for you to call her back.   Best contact #:[phone]

## 2011-12-27 NOTE — Telephone Encounter (Signed)
Called pt to clarify that she had received a depo medorol shot on 11-13-11 in her hip, pt notes that the area is sore and reddned, set pt up for an apt today at 3:30pm, pt accepted

## 2012-01-10 ENCOUNTER — Ambulatory Visit (HOSPITAL_BASED_OUTPATIENT_CLINIC_OR_DEPARTMENT_OTHER): Payer: BC Managed Care – PPO | Attending: Pulmonary Disease

## 2012-01-10 VITALS — Ht 65.0 in | Wt 272.0 lb

## 2012-01-10 DIAGNOSIS — G4733 Obstructive sleep apnea (adult) (pediatric): Secondary | ICD-10-CM | POA: Insufficient documentation

## 2012-01-10 DIAGNOSIS — J45909 Unspecified asthma, uncomplicated: Secondary | ICD-10-CM | POA: Insufficient documentation

## 2012-01-10 DIAGNOSIS — R0683 Snoring: Secondary | ICD-10-CM

## 2012-01-15 ENCOUNTER — Telehealth: Payer: Self-pay | Admitting: Pulmonary Disease

## 2012-01-15 DIAGNOSIS — G4733 Obstructive sleep apnea (adult) (pediatric): Secondary | ICD-10-CM

## 2012-01-15 NOTE — Telephone Encounter (Signed)
PSG showed moderate obstructive sleep apnea  - stopped breathing 18/h If agreeable start autoCPAP 5-12 with medium quattro mask, humidity, download in 4 wks OV in 6 wks

## 2012-01-16 NOTE — Procedures (Signed)
[name], [name]              ACCOUNT NO.:  [id number]  MEDICAL RECORD NO.:  [id number]          PATIENT TYPE:  OUT  LOCATION:  SLEEP CENTER                 FACILITY:  [facility]  PHYSICIAN:  Oretha Milch, MD      DATE OF BIRTH:  Aug 14, 1966  DATE OF STUDY:  01/10/2012                           NOCTURNAL POLYSOMNOGRAM  REFERRING PHYSICIAN:  Oretha Milch, MD  INDICATION FOR STUDY:  [name] is a 46 year old with asthma, witnessed apneas, loud snoring, and excessive daytime somnolence.  At the time of this study, she weighed 272 pints with a height of 5 feet 5 inches, BMI of 45, and Epworth sleepiness score of 14.  This nocturnal polysomnogram was performed with sleep technologist in attendance.  EEG, EOG, EMG, EKG, and respiratory parameters were recorded.  Sleep stages, arousals, limb movements, and respiratory data were scored according to criteria laid out by the American Academy of Sleep Medicine.  SLEEP ARCHITECTURE:  Lights out was at 10:19 p.m., lights on was at 7:20 a.m.  Total sleep time was 399 minutes with a sleep period time of 445 minutes.  Sleep efficiency of 74%.  Sleep latency was 4 minutes and latency to REM sleep was 74 minutes.  Sleep stages of the percentage of total sleep time was N1 6%, N2 60%, N3 6%, and REM sleep 28% (110 minutes).  Supine sleep accounted for 226 minutes.  REM sleep was noted in 4 stages with the longest around midnight.  AROUSAL DATA:  There were a total of 66 arousals with an arousal index of 10 events per hour.  Of these, 55 were spontaneous and the rest were associated with respiratory events.  RESPIRATORY DATA:  There were total of 5 obstructive apneas, 0 central apnea, 0 mixed apneas, and 116 hypopneas with an apnea/hypopnea index of 18 events per hour, 11 RERAs were noted with an RDI of 20 events per hour.  Longest apnea was 17 seconds and longest hypopnea was 49 seconds. The REM related AHI was 61 events per hour.  LIMB  MOVEMENT DATA:  No significant limb movements were noted.  OXYGEN SATURATION DATA:  The desaturation index was 23 events per hour. The lowest desaturation was 74% during REM sleep.  She spent 107 minutes with a saturation less than 88%.  CARDIAC DATA:  No arrhythmias were noted.  Low heart rate was 36 beats per minute and the high heart rate recorded was an artifact.  DISCUSSION:  She was desensitized with a medium full face Quattro mask but did not have sufficient events to meet split night criteria. Predominantly events were noted during REM sleep.  IMPRESSION: 1. Moderate obstructive sleep apnea with hypopneas, predominantly in     REM sleep causing sleep fragmentation and oxygen desaturation. 2. No evidence of cardiac arrhythmias, limb movements, or behavioral     disturbance during sleep. 3. Oxygen desaturations were severe.  RECOMMENDATION: 1. The treatment options for this degree of sleep-disordered breathing     include CPAP therapy and weight loss.  Oral appliances can also be     tried 2. She should be cautioned against driving when sleepy.  She should be  asked to avoid medications with sedative side effects.     Oretha Milch, MD    RVA/MEDQ  D:  01/15/2012 11:26:10  T:  01/16/2012 01:47:20  Job:  [id number]

## 2012-01-17 ENCOUNTER — Other Ambulatory Visit: Payer: Self-pay | Admitting: Pulmonary Disease

## 2012-01-17 DIAGNOSIS — G4733 Obstructive sleep apnea (adult) (pediatric): Secondary | ICD-10-CM

## 2012-01-23 ENCOUNTER — Ambulatory Visit: Payer: BC Managed Care – PPO | Admitting: Pulmonary Disease

## 2012-01-23 ENCOUNTER — Encounter (INDEPENDENT_AMBULATORY_CARE_PROVIDER_SITE_OTHER): Payer: BC Managed Care – PPO

## 2012-01-23 DIAGNOSIS — R0602 Shortness of breath: Secondary | ICD-10-CM

## 2012-01-23 LAB — PULMONARY FUNCTION TEST

## 2012-01-29 ENCOUNTER — Ambulatory Visit (INDEPENDENT_AMBULATORY_CARE_PROVIDER_SITE_OTHER): Payer: BC Managed Care – PPO | Admitting: Pulmonary Disease

## 2012-01-29 ENCOUNTER — Encounter: Payer: Self-pay | Admitting: Pulmonary Disease

## 2012-01-29 DIAGNOSIS — G4733 Obstructive sleep apnea (adult) (pediatric): Secondary | ICD-10-CM

## 2012-01-29 DIAGNOSIS — J45909 Unspecified asthma, uncomplicated: Secondary | ICD-10-CM

## 2012-01-29 NOTE — Patient Instructions (Signed)
Use symbicort 160 2 puffs daily  - Rx & sample Take singulair at night You are doing well on CPAP OK to dc Oxygen

## 2012-01-29 NOTE — Progress Notes (Signed)
  Subjective:    Patient ID: [name], female    DOB: Jan 19, 1966, 46 y.o.   MRN: [id number]  HPI 46 year old never smoker presents for evaluation of asthma and obstructive sleep apnea.  Asthma was diagnosed in 2006. Her triggers include uri and weather changes. She has seasonal allergies and her sinuses act up in fall. She takes over-the-counter Zyrtec .She reports intermittent heartburn controlled by over-the-counter medications. She is now maintained on a regimen of Qvar, albuterol MDI and nebs. She had frequent flares over the last year, was following with urgent care and has now established with Dr. Laury Axon. Her last flare was on 11/13/2011 requiring IM Solu-Medrol, prednisone. 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 Chest x-ray on 09/20/2011 showed chronic bronchitic changes. She has not had formal allergy testing.  Epworth sleepiness score is 11/24. Husband has noted loud snoring but not witnessed apneas. Bedtime is 10 PM to midnight, sleep latency is minimal, she sleeps on her stomach with 2 pillows, she is 2-3 nocturnal awakenings including bathroom visits, she is out of bed at 7 AM feeling tired, denies headaches or dryness of mouth.  After an exacerbation in December 2012, she was placed on home oxygen which she uses as needed during a flare. >> Qvar changed to symbicort 160   01/29/2012   Pt states her breathing has been doing fine. coughing very little. the symbicort has helped with her breathing a litle more than the qvar did. She is taking Symbicort as needed only Spirometry showed no airway obstruction, FEV1 was 82% FVC was 79% ratio 79 PSG showed moderate obstructive sleep apnea - stopped breathing 18/h   started on autoCPAP 5-12 with medium quattro mask, humidity, download in 4 wks  Pt started cpap 1 week ago--pt states she feels a little more rested  Review of Systems Pt denies any significant  nasal congestion or excess secretions, fever, chills, sweats,  unintended wt loss, pleuritic or exertional cp, orthopnea pnd or leg swelling.  Pt also denies any obvious fluctuation in symptoms with weather or environmental change or other alleviating or aggravating factors.    Pt denies any increase in rescue therapy over baseline, denies waking up needing it or having early am exacerbations or coughing/wheezing/ or dyspnea      Objective:   Physical Exam  Gen. Pleasant, obese, in no distress ENT - no lesions, no post nasal drip Neck: No JVD, no thyromegaly, no carotid bruits Lungs: no use of accessory muscles, no dullness to percussion, decreased without rales or rhonchi  Cardiovascular: Rhythm regular, heart sounds  normal, no murmurs or gallops, no peripheral edema Musculoskeletal: No deformities, no cyanosis or clubbing , no tremors       Assessment & Plan:

## 2012-01-29 NOTE — Assessment & Plan Note (Signed)
Started on auto CPAP -download in one month Weight loss encouraged, compliance with goal of at least 4-6 hrs every night is the expectation. Advised against medications with sedative side effects Cautioned against driving when sleepy - understanding that sleepiness will vary on a day to day basis

## 2012-01-29 NOTE — Assessment & Plan Note (Signed)
Stay on Symbicort 160 2 puffs daily and singular at night Attempt step-down in 3 months if stable on this regimen to Symbicort 80

## 2012-02-25 ENCOUNTER — Encounter: Payer: Self-pay | Admitting: Pulmonary Disease

## 2012-03-12 ENCOUNTER — Ambulatory Visit (INDEPENDENT_AMBULATORY_CARE_PROVIDER_SITE_OTHER): Payer: BC Managed Care – PPO | Admitting: Pulmonary Disease

## 2012-03-12 ENCOUNTER — Encounter: Payer: Self-pay | Admitting: Pulmonary Disease

## 2012-03-12 VITALS — BP 128/82 | HR 77 | Temp 98.7°F | Ht 65.0 in | Wt 275.0 lb

## 2012-03-12 DIAGNOSIS — G4733 Obstructive sleep apnea (adult) (pediatric): Secondary | ICD-10-CM

## 2012-03-12 NOTE — Progress Notes (Signed)
  Subjective:    Patient ID: Wendall Papa, female    DOB: September 16, 1966, 46 y.o.   MRN: [id number]  HPI 46 year old never smoker presents for FU of asthma and mod obstructive sleep apnea.  Asthma was diagnosed in 2006. Her triggers include uri and weather changes. She has seasonal allergies and her sinuses act up in fall. She takes over-the-counter Zyrtec .She reports intermittent heartburn controlled by over-the-counter medications. She is now maintained on a regimen of Qvar, albuterol MDI and nebs. She had frequent flares over the last year, was following with urgent care and has now established with Dr. Laury Axon. Her last flare was on 11/13/2011 requiring IM Solu-Medrol, prednisone. 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 Chest x-ray on 09/20/2011 showed chronic bronchitic changes. She has not had formal allergy testing.  Epworth sleepiness score is 11/24. Husband has noted loud snoring but not witnessed apneas. Bedtime is 10 PM to midnight, sleep latency is minimal, she sleeps on her stomach with 2 pillows, she is 2-3 nocturnal awakenings including bathroom visits, she is out of bed at 7 AM feeling tired, denies headaches or dryness of mouth.  After an exacerbation in December 2012, she was placed on home oxygen which she uses as needed during a flare.  >> Qvar changed to symbicort 160   01/29/2012 Spirometry showed no airway obstruction, FEV1 was 82% FVC was 79% ratio 79  PSG showed moderate obstructive sleep apnea - AHI 18/h    03/12/2012 Pt states she wears her cpap machine everynight x 6-8 hrs a night. Pt states she can't stand the mask. Pt c/o dry mouth - humidity at 76%, changed from full face to pillows Download showed good compliance 5h 45 m, AHI 3/h, avg pr 10 cm, leak + Initially was more rested but now effect tapering off Using bee pollen extract - trying to lose wt No nocturnal wheezing, not using rescue albuterol at all, compliant with symbicort - misses 1-2/wk O2 was  turned in  Review of Systems Patient denies significant dyspnea,cough, hemoptysis,  chest pain, palpitations, pedal edema, orthopnea, paroxysmal nocturnal dyspnea, lightheadedness, nausea, vomiting, abdominal or  leg pains      Objective:   Physical Exam Gen. Pleasant, obese, in no distress ENT - no lesions, no post nasal drip Neck: No JVD, no thyromegaly, no carotid bruits Lungs: no use of accessory muscles, no dullness to percussion, decreased without rales or rhonchi  Cardiovascular: Rhythm regular, heart sounds  normal, no murmurs or gallops, no peripheral edema Musculoskeletal: No deformities, no cyanosis or clubbing , no tremors        Assessment & Plan:

## 2012-03-12 NOTE — Assessment & Plan Note (Signed)
Stay on symbicort 

## 2012-03-12 NOTE — Assessment & Plan Note (Addendum)
We will change your CPAP to 10 cm Try to obtain better seal on your mask Increase humidity setting to 80  Weight loss encouraged, compliance with goal of at least 4-6 hrs every night is the expectation. Advised against medications with sedative side effects Cautioned against driving when sleepy - understanding that sleepiness will vary on a day to day basis

## 2012-03-12 NOTE — Patient Instructions (Signed)
We will change your CPAP to 10 cm Try to obtain better seal on your mask Increase humidity setting to 80 Stay on symbicort

## 2012-04-14 ENCOUNTER — Other Ambulatory Visit (HOSPITAL_COMMUNITY): Payer: Self-pay | Admitting: Obstetrics and Gynecology

## 2012-04-14 DIAGNOSIS — Z1231 Encounter for screening mammogram for malignant neoplasm of breast: Secondary | ICD-10-CM

## 2012-04-23 ENCOUNTER — Telehealth: Payer: Self-pay | Admitting: Pulmonary Disease

## 2012-04-23 NOTE — Telephone Encounter (Signed)
Download 6/13 - good usage 10 cm working well Mild leak ok

## 2012-04-24 NOTE — Telephone Encounter (Signed)
I spoke with patient about results and she verbalized understanding and had no questions 

## 2012-04-29 ENCOUNTER — Encounter: Payer: Self-pay | Admitting: Pulmonary Disease

## 2012-05-07 ENCOUNTER — Ambulatory Visit (HOSPITAL_COMMUNITY)
Admission: RE | Admit: 2012-05-07 | Discharge: 2012-05-07 | Disposition: A | Discharge status: Home / Self Care | Payer: BC Managed Care – PPO | Source: Ambulatory Visit | Attending: Obstetrics and Gynecology | Admitting: Obstetrics and Gynecology

## 2012-05-07 DIAGNOSIS — Z1231 Encounter for screening mammogram for malignant neoplasm of breast: Secondary | ICD-10-CM | POA: Insufficient documentation

## 2012-05-13 ENCOUNTER — Telehealth: Payer: Self-pay | Admitting: Pulmonary Disease

## 2012-05-13 MED ORDER — BUDESONIDE-FORMOTEROL FUMARATE 160-4.5 MCG/ACT IN AERO: 2.0000 | INHALATION_SPRAY | Freq: Two times a day (BID) | RESPIRATORY_TRACT | Status: DC

## 2012-05-13 NOTE — Telephone Encounter (Signed)
Pt aware rx has been sent and nothing further was needed 

## 2012-06-10 ENCOUNTER — Telehealth: Payer: Self-pay | Admitting: Pulmonary Disease

## 2012-06-10 NOTE — Telephone Encounter (Signed)
Called and spoke with pt and she stated that the symbicort is $80 per month and she is not able to afford this.  Pt requested a sample.  This has been left up front for her to come by and pick up.  Pt will keep her appt with TP for next month.  Nothing further is needed.

## 2012-07-09 ENCOUNTER — Encounter: Payer: Self-pay | Admitting: Adult Health

## 2012-07-09 ENCOUNTER — Ambulatory Visit (INDEPENDENT_AMBULATORY_CARE_PROVIDER_SITE_OTHER): Payer: BC Managed Care – PPO | Admitting: Adult Health

## 2012-07-09 VITALS — BP 140/84 | HR 75 | Temp 97.6°F | Ht 65.0 in | Wt 283.2 lb

## 2012-07-09 DIAGNOSIS — J45909 Unspecified asthma, uncomplicated: Secondary | ICD-10-CM

## 2012-07-09 MED ORDER — CEFDINIR 300 MG PO CAPS: 300.0000 mg | ORAL_CAPSULE | Freq: Two times a day (BID) | ORAL | Status: DC

## 2012-07-09 MED ORDER — PREDNISONE 10 MG PO TABS: ORAL_TABLET | ORAL | Status: DC

## 2012-07-09 MED ORDER — MONTELUKAST SODIUM 10 MG PO TABS: 10.0000 mg | ORAL_TABLET | Freq: Every day | ORAL | Status: DC

## 2012-07-09 NOTE — Patient Instructions (Addendum)
Omnicef 300mg  Twice daily  For 7 days  Mucinex DM Twice daily  As needed  Cough/congestion  Prednisone taper over next week.  Fluids and rest  Saline nasal rinses . Restart Singulair daily  Please contact office for sooner follow up if symptoms do not improve or worsen or seek emergency care  follow up Dr. Vassie Loll  In 3 months

## 2012-07-09 NOTE — Progress Notes (Signed)
Subjective:    Patient ID: [name], female    DOB: 01/15/1966, 46 y.o.   MRN: [id number]  HPI  46 year old never smoker presents for FU of asthma and mod obstructive sleep apnea.  Asthma was diagnosed in 2006. Her triggers include uri and weather changes. She has seasonal allergies and her sinuses act up in fall. She takes over-the-counter Zyrtec .She reports intermittent heartburn controlled by over-the-counter medications. She is now maintained on a regimen of Qvar, albuterol MDI and nebs. She had frequent flares over the last year, was following with urgent care and has now established with Dr. Laury Axon. Her last flare was on 11/13/2011 requiring IM Solu-Medrol, prednisone. 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 Chest x-ray on 09/20/2011 showed chronic bronchitic changes. She has not had formal allergy testing.  Epworth sleepiness score is 11/24. Husband has noted loud snoring but not witnessed apneas. Bedtime is 10 PM to midnight, sleep latency is minimal, she sleeps on her stomach with 2 pillows, she is 2-3 nocturnal awakenings including bathroom visits, she is out of bed at 7 AM feeling tired, denies headaches or dryness of mouth.  After an exacerbation in December 2012, she was placed on home oxygen which she uses as needed during a flare.  >> Qvar changed to symbicort 160   01/29/2012 Spirometry showed no airway obstruction, FEV1 was 82% FVC was 79% ratio 79  PSG showed moderate obstructive sleep apnea - AHI 18/h    03/12/2012 Pt states she wears her cpap machine everynight x 6-8 hrs a night. Pt states she can't stand the mask. Pt c/o dry mouth - humidity at 76%, changed from full face to pillows Download showed good compliance 5h 45 m, AHI 3/h, avg pr 10 cm, leak + Initially was more rested but now effect tapering off Using bee pollen extract - trying to lose wt No nocturnal wheezing, not using rescue albuterol at all, compliant with symbicort - misses 1-2/wk O2 was  turned in >>  07/09/2012 Follow up  Patient returns for a four-month followup for Asthma  and sleep apnea Wearing CPAP most night on avg  4 hr  night  Takes Symbicort  Without any difficulty.  Stopped  Singulair , could not afford -worse since stopping.  Started claritin .  Complains of 4 days of cough , sneezing and drainage. Thick yellow mucus   c/o head congestion w/ PND, wheezing, increased SOB, chest tightness, sore throat, prod cough with green mucus, sweats x4days No fever or edema.    Review of Systems Constitutional:   No  weight loss, night sweats,  Fevers, chills, fatigue, or  lassitude.  HEENT:   No headaches,  Difficulty swallowing,  Tooth/dental problems, or  Sore throat,                No sneezing, itching, ear ache, \ +nasal congestion, post nasal drip,   CV:  No chest pain,  Orthopnea, PND, swelling in lower extremities, anasarca, dizziness, palpitations, syncope.   GI  No heartburn, indigestion, abdominal pain, nausea, vomiting, diarrhea, change in bowel habits, loss of appetite, bloody stools.   Resp:  .     No chest wall deformity  Skin: no rash or lesions.  GU: no dysuria, change in color of urine, no urgency or frequency.  No flank pain, no hematuria   MS:  No joint pain or swelling.  No decreased range of motion.  No back pain.  Psych:  No change in mood  or affect. No depression or anxiety.  No memory loss.          Objective:   Physical Exam GEN: A/Ox3; pleasant , NAD, well nourished   HEENT:  [place]/AT,  EACs-clear, TMs-wnl, NOSE-clear drainage , THROAT-clear, no lesions, no postnasal drip or exudate noted.   NECK:  Supple w/ fair ROM; no JVD; normal carotid impulses w/o bruits; no thyromegaly or nodules palpated; no lymphadenopathy.  RESP  Coarse BS  w/o, wheezes/ rales/ or rhonchi.no accessory muscle use, no dullness to percussion  CARD:  RRR, no m/r/g  , no peripheral edema, pulses intact, no cyanosis or clubbing.  GI:   Soft & nt; nml bowel  sounds; no organomegaly or masses detected.  Musco: Warm bil, no deformities or joint swelling noted.   Neuro: alert, no focal deficits noted.    Skin: Warm, no lesions or rashes         Assessment & Plan:

## 2012-07-10 NOTE — Assessment & Plan Note (Signed)
Flare with URI  Worse off singulair   Plan Omnicef 300mg  Twice daily  For 7 days  Mucinex DM Twice daily  As needed  Cough/congestion  Prednisone taper over next week.  Fluids and rest  Saline nasal rinses . Restart Singulair daily  Please contact office for sooner follow up if symptoms do not improve or worsen or seek emergency care  follow up Dr. Vassie Loll  In 3 months

## 2012-07-13 ENCOUNTER — Ambulatory Visit: Payer: BC Managed Care – PPO | Admitting: Adult Health

## 2012-07-22 ENCOUNTER — Encounter: Payer: Self-pay | Admitting: Family Medicine

## 2012-07-22 ENCOUNTER — Ambulatory Visit (INDEPENDENT_AMBULATORY_CARE_PROVIDER_SITE_OTHER): Payer: BC Managed Care – PPO | Admitting: Family Medicine

## 2012-07-22 VITALS — BP 131/75 | HR 79 | Temp 98.2°F | Ht 65.0 in | Wt 282.0 lb

## 2012-07-22 DIAGNOSIS — R609 Edema, unspecified: Secondary | ICD-10-CM

## 2012-07-22 DIAGNOSIS — H539 Unspecified visual disturbance: Secondary | ICD-10-CM

## 2012-07-22 LAB — BASIC METABOLIC PANEL
Calcium: 9.1 mg/dL (ref 8.4–10.5)
Creatinine, Ser: 0.7 mg/dL (ref 0.4–1.2)
GFR: 109.35 mL/min (ref >60.00)
Glucose, Bld: 77 mg/dL (ref 70–99)
Sodium: 136 mEq/L (ref 135–145)

## 2012-07-22 NOTE — Progress Notes (Signed)
  Subjective:    Patient ID: [name], female    DOB: 1966-02-02, 46 y.o.   MRN: [id number]  HPI Edema- bilateral, R>L.  Was swollen for 1 week, noted after returning from the beach and eating out.  Swelling gradually improved and resolved yesterday.  No pain- describes tightness.  Sister w/ hx of blood clot.  Denies SOB, swelling of hands, cough.  Visual changes- pt reports she recently had eye exam and doctor indicated her vision has changed considerably.  Asked her if she had DM or thyroid problems.  Denies polyuria, polydipsia, polyphagia, heat/cold intolerance.   Review of Systems For ROS see HPI     Objective:   Physical Exam  Vitals reviewed. Constitutional: She appears well-developed and well-nourished. No distress.  HENT:  Head: Normocephalic and atraumatic.  Eyes: Conjunctivae normal and EOM are normal. Pupils are equal, round, and reactive to light.  Neck: Normal range of motion. Neck supple. No thyromegaly present.  Cardiovascular: Normal rate, regular rhythm, normal heart sounds and intact distal pulses.   Pulmonary/Chest: Effort normal and breath sounds normal. No respiratory distress. She has no wheezes. She has no rales.  Musculoskeletal: She exhibits edema (trace LE edema bilaterally). She exhibits no tenderness.  Lymphadenopathy:    She has no cervical adenopathy.          Assessment & Plan:

## 2012-07-22 NOTE — Assessment & Plan Note (Signed)
New.  Suspect this was combination of heat, increased salt intake, and increased activity on uneven surface.  No evidence of DVT.  Reviewed supportive care and red flags that should prompt return.  Pt expressed understanding and is in agreement w/ plan.

## 2012-07-22 NOTE — Patient Instructions (Addendum)
We'll notify you of your lab results and determine next steps I think the leg swelling was a combo of heat, increased salt intake, and walking on the sand Call with any questions or concerns Hang in there!!

## 2012-07-22 NOTE — Assessment & Plan Note (Signed)
New.  Check labs to r/o thyroid abnormality or DM.

## 2012-08-28 ENCOUNTER — Encounter (HOSPITAL_COMMUNITY): Payer: Self-pay | Admitting: *Deleted

## 2012-08-28 ENCOUNTER — Emergency Department (HOSPITAL_COMMUNITY): Payer: Worker's Compensation

## 2012-08-28 ENCOUNTER — Emergency Department (HOSPITAL_COMMUNITY)
Admission: EM | Admit: 2012-08-28 | Discharge: 2012-08-28 | Disposition: A | Discharge status: Home / Self Care | Payer: Worker's Compensation | Attending: Emergency Medicine | Admitting: Emergency Medicine

## 2012-08-28 DIAGNOSIS — S63509A Unspecified sprain of unspecified wrist, initial encounter: Secondary | ICD-10-CM

## 2012-08-28 DIAGNOSIS — Z889 Allergy status to unspecified drugs, medicaments and biological substances status: Secondary | ICD-10-CM | POA: Insufficient documentation

## 2012-08-28 DIAGNOSIS — Z79899 Other long term (current) drug therapy: Secondary | ICD-10-CM | POA: Insufficient documentation

## 2012-08-28 DIAGNOSIS — Y92410 Unspecified street and highway as the place of occurrence of the external cause: Secondary | ICD-10-CM | POA: Insufficient documentation

## 2012-08-28 DIAGNOSIS — K219 Gastro-esophageal reflux disease without esophagitis: Secondary | ICD-10-CM | POA: Insufficient documentation

## 2012-08-28 DIAGNOSIS — J45909 Unspecified asthma, uncomplicated: Secondary | ICD-10-CM | POA: Insufficient documentation

## 2012-08-28 DIAGNOSIS — R079 Chest pain, unspecified: Secondary | ICD-10-CM | POA: Insufficient documentation

## 2012-08-28 DIAGNOSIS — S93409A Sprain of unspecified ligament of unspecified ankle, initial encounter: Secondary | ICD-10-CM | POA: Insufficient documentation

## 2012-08-28 DIAGNOSIS — Y9389 Activity, other specified: Secondary | ICD-10-CM | POA: Insufficient documentation

## 2012-08-28 DIAGNOSIS — M25519 Pain in unspecified shoulder: Secondary | ICD-10-CM | POA: Insufficient documentation

## 2012-08-28 MED ORDER — HYDROCODONE-ACETAMINOPHEN 5-500 MG PO TABS: 1.0000 | ORAL_TABLET | Freq: Four times a day (QID) | ORAL | Status: DC | PRN

## 2012-08-28 MED ORDER — OXYCODONE-ACETAMINOPHEN 5-325 MG PO TABS
2.0000 | ORAL_TABLET | Freq: Once | ORAL | Status: AC
  Administered 2012-08-28: 2 via ORAL
  Filled 2012-08-28: qty 2
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
## 2012-08-28 NOTE — Progress Notes (Signed)
Orthopedic Tech Progress Note Patient Details:  [name] 1965-11-01 [id number] Left velcro wrist applied Ortho Devices Type of Ortho Device: Velcro wrist splint Ortho Device/Splint Location: Left wrist/forearm   Asia R Thompson 08/28/2012, 1:26 PM

## 2012-09-01 ENCOUNTER — Ambulatory Visit: Payer: Self-pay | Admitting: Family Medicine

## 2012-09-01 NOTE — ED Provider Notes (Signed)
Medical screening examination/treatment/procedure(s) were performed by non-physician practitioner and as supervising physician I was immediately available for consultation/collaboration.   Linas Stepter, MD 09/01/12 1642 

## 2012-10-02 ENCOUNTER — Encounter: Payer: Self-pay | Admitting: Pulmonary Disease

## 2012-10-02 ENCOUNTER — Ambulatory Visit (INDEPENDENT_AMBULATORY_CARE_PROVIDER_SITE_OTHER): Payer: BC Managed Care – PPO | Admitting: Pulmonary Disease

## 2012-10-02 VITALS — BP 122/88 | HR 78 | Temp 98.7°F | Ht 65.0 in | Wt 291.0 lb

## 2012-10-02 DIAGNOSIS — J45909 Unspecified asthma, uncomplicated: Secondary | ICD-10-CM

## 2012-10-02 DIAGNOSIS — G4733 Obstructive sleep apnea (adult) (pediatric): Secondary | ICD-10-CM

## 2012-10-02 NOTE — Assessment & Plan Note (Signed)
Ct cpap 10 cm Weight loss encouraged, compliance with goal of at least 6 hrs every night is the expectation. Advised against medications with sedative side effects Cautioned against driving when sleepy - understanding that sleepiness will vary on a day to day basis

## 2012-10-02 NOTE — Patient Instructions (Addendum)
Stay on symbicort & singulair CPAP is set at 10 cm Trial of mucinex

## 2012-10-02 NOTE — Assessment & Plan Note (Signed)
Stay on symbicort & singulair Trial of mucinex
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
## 2013-02-09 ENCOUNTER — Other Ambulatory Visit: Payer: Self-pay | Admitting: Orthopedic Surgery

## 2013-02-09 DIAGNOSIS — M545 Low back pain, unspecified: Secondary | ICD-10-CM

## 2013-02-13 ENCOUNTER — Ambulatory Visit
Admission: RE | Admit: 2013-02-13 | Discharge: 2013-02-13 | Disposition: A | Discharge status: Home / Self Care | Payer: Worker's Compensation | Source: Ambulatory Visit | Attending: Orthopedic Surgery | Admitting: Orthopedic Surgery

## 2013-02-13 DIAGNOSIS — M545 Low back pain: Secondary | ICD-10-CM

## 2013-02-18 ENCOUNTER — Encounter: Payer: Self-pay | Admitting: Pulmonary Disease

## 2013-02-18 ENCOUNTER — Ambulatory Visit (INDEPENDENT_AMBULATORY_CARE_PROVIDER_SITE_OTHER): Payer: BC Managed Care – PPO | Admitting: Pulmonary Disease

## 2013-02-18 ENCOUNTER — Telehealth: Payer: Self-pay | Admitting: Pulmonary Disease

## 2013-02-18 VITALS — BP 144/92 | HR 84 | Temp 98.5°F | Ht 65.0 in | Wt 289.4 lb

## 2013-02-18 DIAGNOSIS — J40 Bronchitis, not specified as acute or chronic: Secondary | ICD-10-CM

## 2013-02-18 DIAGNOSIS — J45901 Unspecified asthma with (acute) exacerbation: Secondary | ICD-10-CM | POA: Insufficient documentation

## 2013-02-18 DIAGNOSIS — J329 Chronic sinusitis, unspecified: Secondary | ICD-10-CM | POA: Insufficient documentation

## 2013-02-18 MED ORDER — CEFDINIR 300 MG PO CAPS: 600.0000 mg | ORAL_CAPSULE | ORAL | Status: DC

## 2013-02-18 MED ORDER — PREDNISONE 10 MG PO TABS: ORAL_TABLET | ORAL | Status: DC

## 2013-02-18 MED ORDER — BENZONATATE 100 MG PO CAPS: 100.0000 mg | ORAL_CAPSULE | Freq: Four times a day (QID) | ORAL | Status: DC | PRN

## 2013-02-18 NOTE — Assessment & Plan Note (Signed)
The patient has increasing shortness of breath with increased rescue inhaler use, and also had some wheezing on exam today.  This is most consistent with acute asthma exacerbation secondary to a sinopulmonary infection.

## 2013-02-18 NOTE — Progress Notes (Signed)
  Subjective:    Patient ID: [name], female    DOB: [birth year], 47 y.o.   MRN: [id number]  HPI Patient comes in today for an acute sick visit.  She has known asthma, and gives a four-day history of worsening sinus and chest congestion, and now has developed a cough with purulent mucus.  She is also seen increasing shortness of breath, and has been using her rescue inhaler more frequently.  She has had increased wheezing noted.   Review of Systems  Constitutional: Positive for diaphoresis. Negative for fever and unexpected weight change.  HENT: Positive for congestion, rhinorrhea, sneezing, postnasal drip and sinus pressure. Negative for ear pain, nosebleeds, sore throat, trouble swallowing and dental problem.   Eyes: Negative for redness and itching.  Respiratory: Positive for cough ( worse at night// yellow mucus with some blood), chest tightness, shortness of breath and wheezing.   Cardiovascular: Negative for palpitations and leg swelling.  Gastrointestinal: Negative for nausea and vomiting.  Genitourinary: Negative for dysuria.  Musculoskeletal: Negative for joint swelling.  Skin: Negative for rash.  Neurological: Negative for headaches.  Hematological: Does not bruise/bleed easily.  Psychiatric/Behavioral: Negative for dysphoric mood. The patient is not nervous/anxious.        Objective:   Physical Exam Obese female in no acute distress Nose without purulence or discharge noted, But swollen mucous membranes with erythema Oropharynx clear, no exudates Neck without lymphadenopathy or thyromegaly Chest with isolated wheeze on the left, but adequate air flow.  No upper airway pseudo-wheezing noted Cardiac exam with regular rate and rhythm Lower extremities with no significant edema, no cyanosis Alert and oriented, moves all 4 extremities.       Assessment & Plan:

## 2013-02-18 NOTE — Telephone Encounter (Signed)
Spoke with patient-- Patient c/o "allergy like" symptoms x 3-4 days Patient states she has had a prod cough w yellow mucus, wheezing, SOB at times ans sweats Patient states her cough is so bad its making her body sore Pt has been taking OTC meds-- (unable to state the names) but has not helped eliminate cough Patient requesting tessalon perles for her cough and any other recs per Dr. Vassie Loll Patient will come in of needed but would like something called in if possible Dr. Vassie Loll please advise, thank you!  Last OV: 12.20.13 Next OV:6.20.14 No Known Allergies

## 2013-02-18 NOTE — Telephone Encounter (Signed)
Patient called back and made appt with [facility] for acute visit at 11:15. Patient states when I spoke with her that she just wants to be seen.

## 2013-02-18 NOTE — Telephone Encounter (Signed)
Tessalon perles 200 tid prn x 30  DELSYm 2 tsp tid prn OV with TP if no better

## 2013-02-18 NOTE — Patient Instructions (Addendum)
Will treat you with omnicef 300mg , take 2 each am for next 5 days. Use neilmed sinus rinses am and pm to help clear nasal passages. Will treat you with a short course of prednisone to help with your asthma symptoms. Tessalon pearls 100mg , take 2 every 6hrs if needed for cough  Keep followup with Dr. Vassie Loll, but call if you are not improving.

## 2013-02-18 NOTE — Assessment & Plan Note (Signed)
The patient is having purulence from her nose and also from her chest with coughing, and therefore will treat her with a course of antibiotics.

## 2013-04-02 ENCOUNTER — Encounter: Payer: Self-pay | Admitting: Adult Health

## 2013-04-02 ENCOUNTER — Ambulatory Visit (INDEPENDENT_AMBULATORY_CARE_PROVIDER_SITE_OTHER): Payer: BC Managed Care – PPO | Admitting: Adult Health

## 2013-04-02 VITALS — BP 132/80 | HR 80 | Temp 98.7°F | Ht 65.0 in | Wt 293.8 lb

## 2013-04-02 DIAGNOSIS — J309 Allergic rhinitis, unspecified: Secondary | ICD-10-CM

## 2013-04-02 DIAGNOSIS — J302 Other seasonal allergic rhinitis: Secondary | ICD-10-CM

## 2013-04-02 DIAGNOSIS — G4733 Obstructive sleep apnea (adult) (pediatric): Secondary | ICD-10-CM

## 2013-04-02 MED ORDER — AZITHROMYCIN 250 MG PO TABS: ORAL_TABLET | ORAL | Status: AC

## 2013-04-02 NOTE — Patient Instructions (Addendum)
Continue on current regimen. Wear CPAP each night. Continued to work on Weight loss Exercise as tolerated. May use Claritin 10 mg daily as needed. For nasal drainage. Saline nasal rinses as needed. A Z-Pak to have on hold if symptoms do not improve or worsen with discolored mucus. follow up with Dr. Vassie Loll in 6 months and as needed

## 2013-04-02 NOTE — Assessment & Plan Note (Addendum)
Continue on current regimen. Wear CPAP each night. Continued to work on Weight loss Exercise as tolerated. follow up with Dr. Vassie Loll in 6 months and as needed

## 2013-04-02 NOTE — Assessment & Plan Note (Signed)
AR flare w/ URI   Plan May use Claritin 10 mg daily as needed. For nasal drainage. Saline nasal rinses as needed. A Z-Pak to have on hold if symptoms do not improve or worsen with discolored mucus. follow up with Dr. Vassie Loll in 6 months and as needed
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
## 2013-07-01 ENCOUNTER — Encounter: Payer: Self-pay | Admitting: Internal Medicine

## 2013-07-01 ENCOUNTER — Ambulatory Visit (INDEPENDENT_AMBULATORY_CARE_PROVIDER_SITE_OTHER): Payer: BC Managed Care – PPO | Admitting: Internal Medicine

## 2013-07-01 VITALS — BP 130/86 | HR 90 | Temp 98.5°F | Ht 65.0 in | Wt 291.4 lb

## 2013-07-01 DIAGNOSIS — J45909 Unspecified asthma, uncomplicated: Secondary | ICD-10-CM

## 2013-07-01 DIAGNOSIS — Z23 Encounter for immunization: Secondary | ICD-10-CM

## 2013-07-01 DIAGNOSIS — G4733 Obstructive sleep apnea (adult) (pediatric): Secondary | ICD-10-CM
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
## 2013-07-01 NOTE — Patient Instructions (Addendum)
Please see patient coordinator before you leave today  to change cpap to autoset mode for now   Please schedule a follow up office visit in 4 weeks,  call sooner if needed with Dr Vassie Loll

## 2013-07-03 NOTE — Assessment & Plan Note (Signed)
All goals of chronic asthma control met including optimal function and elimination of symptoms with minimal need for rescue therapy.  Contingencies discussed in full including contacting this office immediately if not controlling the symptoms using the rule of two's.    

## 2013-07-03 NOTE — Assessment & Plan Note (Signed)
Not tolerating present settings ? If they are what Dr Vassie Loll intended  Rec try autoset until f/u with Dr Vassie Loll - discussed that in future she shouldn't wait sev months to report problems with her machine  See instructions for specific recommendations which were reviewed directly with the patient who was given a copy with highlighter outlining the key components.

## 2013-07-05 ENCOUNTER — Telehealth: Payer: Self-pay | Admitting: *Deleted

## 2013-07-05 DIAGNOSIS — G4733 Obstructive sleep apnea (adult) (pediatric): Secondary | ICD-10-CM

## 2013-07-05 NOTE — Telephone Encounter (Signed)
Message copied by Tommie Sams on Mon Jul 05, 2013  5:16 PM ------      Message from: Cyril Mourning V      Created: Sun Jul 04, 2013  6:01 PM       She is supposed to be on 10 cm per last download.      Pl obtain download for my review            RA      ----- Message -----         From: Nyoka Cowden, MD         Sent: 07/03/2013   6:47 AM           To: Oretha Milch, MD            Not tolerating present settings ? If they are what Dr Vassie Loll intended            Rec try autoset until f/u with Dr Vassie Loll - discussed that in future she shouldn't wait sev months to report problems with her machine       ------

## 2013-07-05 NOTE — Telephone Encounter (Signed)
Order has been placed and staff message to [facility]

## 2013-07-25 ENCOUNTER — Telehealth: Payer: Self-pay | Admitting: Pulmonary Disease

## 2013-07-25 NOTE — Telephone Encounter (Signed)
12m download 07/06/13 on 10 cm >> no residuals, leak OK, good usage> 5h

## 2013-07-29 ENCOUNTER — Encounter: Payer: Self-pay | Admitting: Pulmonary Disease

## 2013-07-29 ENCOUNTER — Other Ambulatory Visit: Payer: Self-pay | Admitting: Pulmonary Disease

## 2013-07-29 ENCOUNTER — Ambulatory Visit (INDEPENDENT_AMBULATORY_CARE_PROVIDER_SITE_OTHER): Payer: BC Managed Care – PPO | Admitting: Pulmonary Disease

## 2013-07-29 ENCOUNTER — Ambulatory Visit: Payer: Self-pay | Admitting: Internal Medicine

## 2013-07-29 VITALS — BP 122/68 | HR 64 | Temp 99.7°F | Ht 65.0 in | Wt 294.6 lb

## 2013-07-29 DIAGNOSIS — J45909 Unspecified asthma, uncomplicated: Secondary | ICD-10-CM

## 2013-07-29 DIAGNOSIS — G4733 Obstructive sleep apnea (adult) (pediatric): Secondary | ICD-10-CM

## 2013-07-29 NOTE — Progress Notes (Signed)
  Subjective:    Patient ID: [name], female    DOB: September 27, 1966, 47 y.o.   MRN: [id number]  HPI  47 year old never smoker presents for FU of asthma and mod obstructive sleep apnea.  Asthma was diagnosed in 2006. Her triggers include uri and weather changes. She has seasonal allergies and her sinuses act up in fall. 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 She has not had formal allergy testing.  After an exacerbation in December 2012, she was placed on home oxygen -this was dc'd by [place]  01/29/2012 Spirometry showed no airway obstruction, FEV1 was 82% FVC was 79% ratio 79  PSG showed moderate obstructive sleep apnea - AHI 18/h  Download 5/13 showed good compliance 5h 45 m, AHI 3/h, avg pr 10 cm, leak +  Download 6/13 - good usage on 10 cm ,Mild leak ok     07/29/2013  Chief Complaint  Patient presents with  . Sleep Apnea    Pt reports she wears her CPAP everynight x 7-8 hrs a night. She does feel rested during the day  . Asthma    reports breathing has been okay.      She is supposed to be on 10 cm per last download.  40m download 07/06/13 on 10 cm >> no residuals, leak OK, good usage> 5h  Placed on auto set after by Dr wert   [name] self dc'd int he past - but symptoms worse hence restarted , now has stopped again x 1 mnth No flares this year    Review of Systems neg for any significant sore throat, dysphagia, itching, sneezing, nasal congestion or excess/ purulent secretions, fever, chills, sweats, unintended wt loss, pleuritic or exertional cp, hempoptysis, orthopnea pnd or change in chronic leg swelling. Also denies presyncope, palpitations, heartburn, abdominal pain, nausea, vomiting, diarrhea or change in bowel or urinary habits, dysuria,hematuria, rash, arthralgias, visual complaints, headache, numbness weakness or ataxia.     Objective:   Physical Exam  Gen. Pleasant, obese, in no distress ENT - no lesions, no post nasal drip Neck: No JVD, no thyromegaly, no carotid bruits Lungs: no use of accessory muscles, no dullness to percussion, decreased without rales or rhonchi  Cardiovascular: Rhythm regular, heart sounds  normal, no murmurs or gallops, no peripheral edema Musculoskeletal: No deformities, no cyanosis or clubbing , no tremors       Assessment & Plan:

## 2013-07-29 NOTE — Patient Instructions (Signed)
Singulair during fall & spring Stay on symbicort CPAP is set at 10 cm

## 2013-08-01 NOTE — Assessment & Plan Note (Signed)
CPAP is set at 10 cm  Weight loss encouraged, compliance with goal of at least 4-6 hrs every night is the expectation. Advised against medications with sedative side effects Cautioned against driving when sleepy - understanding that sleepiness will vary on a day to day basis

## 2013-08-01 NOTE — Assessment & Plan Note (Signed)
Singulair during fall & spring Stay on symbicort

## 2013-09-24 LAB — HM PAP SMEAR: HM Pap smear: NORMAL

## 2013-10-08 ENCOUNTER — Encounter: Payer: Self-pay | Admitting: Family Medicine

## 2013-10-08 ENCOUNTER — Ambulatory Visit (INDEPENDENT_AMBULATORY_CARE_PROVIDER_SITE_OTHER): Payer: BC Managed Care – PPO | Admitting: Family Medicine

## 2013-10-08 VITALS — BP 126/78 | HR 76 | Temp 98.2°F | Resp 16 | Wt 290.0 lb

## 2013-10-08 DIAGNOSIS — N9489 Other specified conditions associated with female genital organs and menstrual cycle: Secondary | ICD-10-CM

## 2013-10-08 DIAGNOSIS — B353 Tinea pedis: Secondary | ICD-10-CM | POA: Insufficient documentation

## 2013-10-08 DIAGNOSIS — R319 Hematuria, unspecified: Secondary | ICD-10-CM

## 2013-10-08 DIAGNOSIS — R300 Dysuria: Secondary | ICD-10-CM | POA: Insufficient documentation

## 2013-10-08 DIAGNOSIS — N949 Unspecified condition associated with female genital organs and menstrual cycle: Secondary | ICD-10-CM

## 2013-10-08 LAB — POCT URINALYSIS DIPSTICK
Leukocytes, UA: NEGATIVE
Nitrite, UA: NEGATIVE
pH, UA: 6.5

## 2013-10-08 MED ORDER — CLOTRIMAZOLE-BETAMETHASONE 1-0.05 % EX CREA: 1.0000 "application " | TOPICAL_CREAM | Freq: Two times a day (BID) | CUTANEOUS | Status: DC

## 2013-10-08 MED ORDER — CEPHALEXIN 500 MG PO CAPS: 500.0000 mg | ORAL_CAPSULE | Freq: Two times a day (BID) | ORAL | Status: AC

## 2013-10-08 NOTE — Progress Notes (Signed)
Pre visit review using our clinic review tool, if applicable. No additional management support is needed unless otherwise documented below in the visit note. 

## 2013-10-08 NOTE — Progress Notes (Signed)
   Subjective:    Patient ID: [name], female    DOB: July 22, 1966, 47 y.o.   MRN: [id number]  HPI Foot itching- R foot, will itch to point of burning.  sxs started 'a good while' ago.  Has been buying OTC athlete's foot product.  No rash.  Skin is dry, flaking.  Sister dx'd w/ foot fungus.  sxs are predominately on bottom of foot.  Dysuria- also w/ suprapubic pressure.  sxs started 'a couple months ago'.  sxs are worse when pt attempts to hold urine.   Review of Systems For ROS see HPI     Objective:   Physical Exam  Vitals reviewed. Constitutional: She appears well-developed and well-nourished. No distress.  Abdominal: Soft. She exhibits no distension. There is tenderness (+ suprapubic but no CVA tenderness ).  Skin: Skin is warm and dry. Rash (fungal rash and  dry, cracked skin on feet) noted.          Assessment & Plan:

## 2013-10-08 NOTE — Patient Instructions (Signed)
Follow up as needed Start the Keflex twice daily for the UTI Apply the Lotrisone cream twice daily for the foot fungus Call with any questions or concerns Happy New Year!

## 2013-10-14 NOTE — Assessment & Plan Note (Signed)
New.  Start combination steroid/antifungal cream to tx both infxn and dry, cracked skin.  Reviewed supportive care and red flags that should prompt return.  Pt expressed understanding and is in agreement w/ plan.

## 2013-10-14 NOTE — Assessment & Plan Note (Signed)
New.  Pt's UA consistent w/ infxn.  Start abx.  Send for cx.

## 2013-12-01 ENCOUNTER — Encounter: Payer: Self-pay | Admitting: Family Medicine

## 2013-12-14 ENCOUNTER — Telehealth: Payer: Self-pay

## 2013-12-14 NOTE — Telephone Encounter (Signed)
Left message for call back Identifiable    Pap- 09/24/13 MMg-05/07/12-negative Flu- 07/01/13 Td- 07/09/11

## 2013-12-14 NOTE — Telephone Encounter (Signed)
Medication and allergies:  Reviewed and updated  90 day supply/mail order: n/a Local pharmacy:  [id number] - [place], [place] - [address]   Immunizations due:  UTD   A/P: No changes to personal, family history or past surgical hx Pap- 09/24/13- negative MMg-05/07/12-negative  Flu- 07/01/13  Td- 07/09/11   To Discuss with Provider: Patient continues to have fungal rash on right foot.  Patient states that it no longer itch or burn due to cream.

## 2013-12-15 ENCOUNTER — Encounter: Payer: Self-pay | Admitting: Family Medicine

## 2013-12-15 ENCOUNTER — Ambulatory Visit (INDEPENDENT_AMBULATORY_CARE_PROVIDER_SITE_OTHER): Payer: BC Managed Care – PPO | Admitting: Family Medicine

## 2013-12-15 ENCOUNTER — Encounter: Payer: Self-pay | Admitting: General Practice

## 2013-12-15 VITALS — BP 124/74 | HR 82 | Temp 98.0°F | Resp 16 | Ht 65.5 in | Wt 289.5 lb

## 2013-12-15 DIAGNOSIS — Z0000 Encounter for general adult medical examination without abnormal findings: Secondary | ICD-10-CM

## 2013-12-15 DIAGNOSIS — R1319 Other dysphagia: Secondary | ICD-10-CM

## 2013-12-15 DIAGNOSIS — Z6841 Body Mass Index (BMI) 40.0 and over, adult: Secondary | ICD-10-CM

## 2013-12-15 HISTORY — DX: Morbid (severe) obesity due to excess calories: E66.01

## 2013-12-15 HISTORY — DX: Body Mass Index (BMI) 40.0 and over, adult: Z684

## 2013-12-15 LAB — BASIC METABOLIC PANEL
BUN: 10 mg/dL (ref 6–23)
CHLORIDE: 102 meq/L (ref 96–112)
CO2: 29 meq/L (ref 19–32)
CREATININE: 0.6 mg/dL (ref 0.4–1.2)
Calcium: 9.1 mg/dL (ref 8.4–10.5)
GFR: 133.75 mL/min (ref >60.00)
GLUCOSE: 68 mg/dL — AB (ref 70–99)
Potassium: 3.5 mEq/L (ref 3.5–5.1)
Sodium: 138 mEq/L (ref 135–145)

## 2013-12-15 LAB — CBC WITH DIFFERENTIAL/PLATELET
BASOS ABS: 0 10*3/uL (ref 0.0–0.1)
Basophils Relative: 0.4 % (ref 0.0–3.0)
Eosinophils Absolute: 0.2 10*3/uL (ref 0.0–0.7)
Eosinophils Relative: 3.6 % (ref 0.0–5.0)
HEMATOCRIT: 38.4 % (ref 36.0–46.0)
Hemoglobin: 12.5 g/dL (ref 12.0–15.0)
LYMPHS ABS: 2.2 10*3/uL (ref 0.7–4.0)
Lymphocytes Relative: 32.8 % (ref 12.0–46.0)
MCHC: 32.5 g/dL (ref 30.0–36.0)
MCV: 93.1 fl (ref 78.0–100.0)
MONO ABS: 0.4 10*3/uL (ref 0.1–1.0)
MONOS PCT: 6.2 % (ref 3.0–12.0)
Neutro Abs: 3.9 10*3/uL (ref 1.4–7.7)
Neutrophils Relative %: 57 % (ref 43.0–77.0)
PLATELETS: 297 10*3/uL (ref 150.0–400.0)
RBC: 4.13 Mil/uL (ref 3.87–5.11)
RDW: 14.1 % (ref 11.5–14.6)
WBC: 6.8 10*3/uL (ref 4.5–10.5)

## 2013-12-15 LAB — HEPATIC FUNCTION PANEL
ALBUMIN: 3.5 g/dL (ref 3.5–5.2)
ALK PHOS: 50 U/L (ref 39–117)
ALT: 14 U/L (ref 0–35)
AST: 18 U/L (ref 0–37)
Bilirubin, Direct: 0 mg/dL (ref 0.0–0.3)
Total Bilirubin: 0.6 mg/dL (ref 0.3–1.2)
Total Protein: 7.2 g/dL (ref 6.0–8.3)

## 2013-12-15 LAB — LIPID PANEL
CHOL/HDL RATIO: 3
CHOLESTEROL: 181 mg/dL (ref 0–200)
HDL: 57.9 mg/dL (ref >39.00)
LDL Cholesterol: 112 mg/dL — ABNORMAL HIGH (ref 0–99)
TRIGLYCERIDES: 58 mg/dL (ref 0.0–149.0)
VLDL: 11.6 mg/dL (ref 0.0–40.0)

## 2013-12-15 LAB — TSH: TSH: 0.76 u[IU]/mL (ref 0.35–5.50)

## 2013-12-15 LAB — HEMOGLOBIN A1C: Hgb A1c MFr Bld: 6.2 % (ref 4.6–6.5)

## 2013-12-15 MED ORDER — PHENTERMINE HCL 37.5 MG PO CAPS: 37.5000 mg | ORAL_CAPSULE | ORAL | Status: DC

## 2013-12-15 NOTE — Assessment & Plan Note (Signed)
New.  Pt wants to lose weight but is unsure how to proceed.  Discussed importance of healthy diet, reviewed what constitutes a carb, outlined plate composition, talked about need to not skip meals and plan ahead to ensure healthy eating.  Also stressed importance of regular exercise/activity.  Start phentermine.  Monitor closely for weight loss, HTN, anxiety.

## 2013-12-15 NOTE — Assessment & Plan Note (Signed)
New.  Refer to GI for evaluation.

## 2013-12-15 NOTE — Assessment & Plan Note (Signed)
Pt's PE WNL w/ exception of obesity.  UTD on GYN.  Check labs.  Anticipatory guidance provided.  

## 2013-12-15 NOTE — Patient Instructions (Signed)
Follow up in 1 month to recheck weight loss progress and BP Start the Phentermine daily We'll call you with your GI appt for the trouble swallowing Eat 3 healthy meals daily w/ mid morning and mid afternoon snacks if possible Limit your starches! Think of dividing your plate in 1/6X1/4s- 1/4 protein, 1/4 starch, 1/2 fruits and veggies Try and get regular exercise- goal is 30 min/day, at least 4x/week Call with any questions or concerns Hang in there!!

## 2013-12-15 NOTE — Progress Notes (Signed)
   Subjective:    Patient ID: [name], female    DOB: 06/29/1966, 48 y.o.   MRN: [id number]  HPI CPE- UTD on GYN (Dr Senaida Oresichardson).  Obesity- pt wants to lose weight.  Sister is on phentermine.  Exercising but not regularly.  Not sure what to eat or 'how to do it'.   Review of Systems Patient reports no vision/ hearing changes, adenopathy,fever, weight change,  persistant/recurrent hoarseness, chest pain, palpitations, edema, persistant/recurrent cough, hemoptysis, dyspnea (rest/exertional/paroxysmal nocturnal), gastrointestinal bleeding (melena, rectal bleeding), abdominal pain, significant heartburn, bowel changes, GU symptoms (dysuria, hematuria, incontinence), Gyn symptoms (abnormal  bleeding, pain),  syncope, focal weakness, memory loss, numbness & tingling, skin/hair/nail changes, abnormal bruising or bleeding, anxiety, or depression.  + dysphagia- occuring mostly w/ water     Objective:   Physical Exam General Appearance:    Alert, cooperative, no distress, appears stated age, obese  Head:    Normocephalic, without obvious abnormality, atraumatic  Eyes:    PERRL, conjunctiva/corneas clear, EOM's intact, fundi    benign, both eyes  Ears:    Normal TM's and external ear canals, both ears  Nose:   Nares normal, septum midline, mucosa normal, no drainage    or sinus tenderness  Throat:   Lips, mucosa, and tongue normal; teeth and gums normal  Neck:   Supple, symmetrical, trachea midline, no adenopathy;    Thyroid: no enlargement/tenderness/nodules  Back:     Symmetric, no curvature, ROM normal, no CVA tenderness  Lungs:     Clear to auscultation bilaterally, respirations unlabored  Chest Wall:    No tenderness or deformity   Heart:    Regular rate and rhythm, S1 and S2 normal, no murmur, rub   or gallop  Breast Exam:    Deferred to GYN  Abdomen:     Soft, non-tender, bowel sounds active all four quadrants,    no masses, no organomegaly  Genitalia:    Deferred to GYN    Rectal:    Extremities:   Extremities normal, atraumatic, no cyanosis or edema  Pulses:   2+ and symmetric all extremities  Skin:   Skin color, texture, turgor normal, no rashes or lesions  Lymph nodes:   Cervical, supraclavicular, and axillary nodes normal  Neurologic:   CNII-XII intact, normal strength, sensation and reflexes    throughout          Assessment & Plan:

## 2013-12-15 NOTE — Progress Notes (Signed)
Pre visit review using our clinic review tool, if applicable. No additional management support is needed unless otherwise documented below in the visit note. 

## 2013-12-19 LAB — VITAMIN D 1,25 DIHYDROXY
VITAMIN D 1, 25 (OH) TOTAL: 82 pg/mL — AB (ref 18–72)
Vitamin D2 1, 25 (OH)2: <8 pg/mL
Vitamin D3 1, 25 (OH)2: 82 pg/mL

## 2013-12-20 ENCOUNTER — Encounter: Payer: Self-pay | Admitting: General Practice

## 2013-12-23 ENCOUNTER — Encounter: Payer: Self-pay | Admitting: Gastroenterology

## 2014-01-03 ENCOUNTER — Encounter: Payer: Self-pay | Admitting: Gastroenterology

## 2014-01-03 ENCOUNTER — Ambulatory Visit (INDEPENDENT_AMBULATORY_CARE_PROVIDER_SITE_OTHER): Payer: BC Managed Care – PPO | Admitting: Gastroenterology

## 2014-01-03 VITALS — BP 120/80 | HR 68 | Ht 65.0 in | Wt 285.6 lb

## 2014-01-03 DIAGNOSIS — R1319 Other dysphagia: Secondary | ICD-10-CM

## 2014-01-03 NOTE — Patient Instructions (Signed)
You have been scheduled for a Barium Esophogram/ Modified Barium Swallow at [facility] of the hospital) on 01/07/14 at 11:00am. Please arrive 15 minutes prior to your appointment for registration. Make certain not to have anything to eat or drink 6 hours prior to your test. If you need to reschedule for any reason, please contact radiology at [phone] to do so. __________________________________________________________________ A barium swallow is an examination that concentrates on views of the esophagus. This tends to be a double contrast exam (barium and two liquids which, when combined, create a gas to distend the wall of the oesophagus) or single contrast (non-ionic iodine based). The study is usually tailored to your symptoms so a good history is essential. Attention is paid during the study to the form, structure and configuration of the esophagus, looking for functional disorders (such as aspiration, dysphagia, achalasia, motility and reflux) EXAMINATION You may be asked to change into a gown, depending on the type of swallow being performed. A radiologist and radiographer will perform the procedure. The radiologist will advise you of the type of contrast selected for your procedure and direct you during the exam. You will be asked to stand, sit or lie in several different positions and to hold a small amount of fluid in your mouth before being asked to swallow while the imaging is performed .In some instances you may be asked to swallow barium coated marshmallows to assess the motility of a solid food bolus. The exam can be recorded as a digital or video fluoroscopy procedure. POST PROCEDURE It will take 1-2 days for the barium to pass through your system. To facilitate this, it is important, unless otherwise directed, to increase your fluids for the next 24-48hrs and to resume your normal diet.  This test typically takes about 30 minutes to  perform. __________________________________________________________________________________ __________________________________________________________________ A Modified Barium Swallow Study, or MBS, is a special x-ray that is taken to check swallowing skills. It is carried out by a Marine scientistadiologist and a Warehouse managerpeech Language Pathologist (SLP). During this test, yourmouth, throat, and esophagus, a muscular tube which connects your mouth to your stomach, is checked. The test will help you, your doctor, and the SLP plan what types of foods and liquids are easier for you to swallow. The SLP will also identify positions and ways to help you swallow more easily and safely. What will happen during an MBS? You will be taken to an x-ray room and seated comfortably. You will be asked to swallow small amounts of food and liquid mixed with barium. Barium is a liquid or paste that allows images of your mouth, throat and esophagus to be seen on x-ray. The x-ray captures moving images of the food you are swallowing as it travels from your mouth through your throat and into your esophagus. This test helps identify whether food or liquid is entering your lungs (aspiration). The test also shows which part of your mouth or throat lacks strength or coordination to move the food or liquid in the right direction. This test typically takes 30 minutes to 1 hour to complete. _______________________________________________________________________

## 2014-01-03 NOTE — Progress Notes (Signed)
    History of Present Illness: This is a 48 year old female who notes a one-year history of intermittent choking and coughing when swallowing water or other liquids. She has no difficulties with foods. She has rare episodes of heartburn related to certain foods. Denies weight loss, abdominal pain, constipation, diarrhea, change in stool caliber, melena, hematochezia, nausea, vomiting, dysphagia, reflux symptoms, chest pain.  Review of Systems: Pertinent positive and negative review of systems were noted in the above HPI section. All other review of systems were otherwise negative.  Current Medications, Allergies, Past Medical History, Past Surgical History, Family History and Social History were reviewed in [facility] electronic medical record.  Physical Exam: General: Well developed , well nourished, no acute distress Head: Normocephalic and atraumatic Eyes:  sclerae anicteric, EOMI Ears: Normal auditory acuity Mouth: No deformity or lesions Neck: Supple, no masses or thyromegaly Lungs: Clear throughout to auscultation Heart: Regular rate and rhythm; no murmurs, rubs or bruits Abdomen: Soft, non tender and non distended. No masses, hepatosplenomegaly or hernias noted. Normal Bowel sounds Rectal: Musculoskeletal: Symmetrical with no gross deformities  Skin: No lesions on visible extremities Pulses:  Normal pulses noted Extremities: No clubbing, cyanosis, edema or deformities noted Neurological: Alert oriented x 4, grossly nonfocal Cervical Nodes:  No significant cervical adenopathy Inguinal Nodes: No significant inguinal adenopathy Psychological:  Alert and cooperative. Normal mood and affect  Assessment and Recommendations:  1. Dysphagia with liquids, choking, coughing. R/O oropharyngeal disorder or motility disorder. Schedule MBSS and BA esophagram.

## 2014-01-07 ENCOUNTER — Ambulatory Visit (HOSPITAL_COMMUNITY)
Admission: RE | Admit: 2014-01-07 | Discharge: 2014-01-07 | Disposition: A | Discharge status: Home / Self Care | Payer: BC Managed Care – PPO | Source: Ambulatory Visit | Attending: Gastroenterology | Admitting: Gastroenterology

## 2014-01-07 ENCOUNTER — Other Ambulatory Visit: Payer: Self-pay | Admitting: Gastroenterology

## 2014-01-07 DIAGNOSIS — R059 Cough, unspecified: Secondary | ICD-10-CM | POA: Insufficient documentation

## 2014-01-07 DIAGNOSIS — R1310 Dysphagia, unspecified: Secondary | ICD-10-CM

## 2014-01-07 DIAGNOSIS — R6889 Other general symptoms and signs: Secondary | ICD-10-CM | POA: Insufficient documentation

## 2014-01-07 DIAGNOSIS — R05 Cough: Secondary | ICD-10-CM | POA: Insufficient documentation

## 2014-01-07 DIAGNOSIS — R1319 Other dysphagia: Secondary | ICD-10-CM

## 2014-01-07 NOTE — Procedures (Signed)
Objective Swallowing Evaluation: Modified Barium Swallowing Study  Patient Details  Name: [name] MRN: [id number] Date of Birth: 08/06/1966  Today's Date: 01/07/2014 Time: 1105-1120 SLP Time Calculation (min): 15 min  Past Medical History:  Past Medical History  Diagnosis Date  . Asthma   . GERD (gastroesophageal reflux disease)   . Allergy   . Bronchitis    Past Surgical History:  Past Surgical History  Procedure Laterality Date  . Ectopic pregnancy surgery  2006   HPI:  48 y.o. female with hx of asthma and sleep apnea referred for OPMBS  due to one-year history of intermittent choking and coughing when swallowing water or other liquids. She has no difficulties with foods. She has rare episodes of heartburn related to certain foods     Assessment / Plan / Recommendation Clinical Impression  Dysphagia Diagnosis: Within Functional Limits Clinical impression: Pt presents with normal oropharyngeal swallow function with timely swallow initiation, adequate hyolaryngeal mobility, and no penetration nor aspiration.  There was adequate patency of UES.  Pt scheduled for esophagram after today's MBS.      Treatment Recommendation  No treatment recommended at this time    Diet Recommendation Regular;Thin liquid   Liquid Administration via: Cup;Straw Medication Administration: Whole meds with liquid Supervision: Patient able to self feed    Other  Recommendations Oral Care Recommendations: Oral care BID   Follow Up Recommendations  None      General HPI: 48 y.o. female with hx of asthma and sleep apnea referred for OPMBS  due to one-year history of intermittent choking and coughing when swallowing water or other liquids. She has no difficulties with foods. She has rare episodes of heartburn related to certain foods Type of Study: Modified Barium Swallowing Study Reason for Referral: Objectively evaluate swallowing function Diet Prior to this Study: Regular;Thin  liquids Temperature Spikes Noted: No Respiratory Status: Room air History of Recent Intubation: No Behavior/Cognition: Alert;Cooperative Oral Cavity - Dentition: Adequate natural dentition Oral Motor / Sensory Function: Within functional limits Self-Feeding Abilities: Able to feed self Patient Positioning: Upright in chair Baseline Vocal Quality: Clear Volitional Cough: Strong Volitional Swallow: Able to elicit Anatomy:  (presence of what appear to be osteophytes along cervical col) Pharyngeal Secretions: Not observed secondary MBS    Reason for Referral Objectively evaluate swallowing function   Oral Phase Oral Preparation/Oral Phase Oral Phase: WFL   Pharyngeal Phase Pharyngeal Phase Pharyngeal Phase: Within functional limits  Cervical Esophageal Phase   [name] L. [place], [place] CCC/SLP Pager [phone]     Cervical Esophageal Phase Cervical Esophageal Phase: Leonarda SalonWFL         Blenda MountsCouture, [name] 01/07/2014, 12:12 PM

## 2014-03-08 ENCOUNTER — Encounter: Payer: Self-pay | Admitting: Adult Health

## 2014-03-08 ENCOUNTER — Ambulatory Visit (INDEPENDENT_AMBULATORY_CARE_PROVIDER_SITE_OTHER): Payer: BC Managed Care – PPO | Admitting: Adult Health

## 2014-03-08 VITALS — BP 134/84 | HR 73 | Temp 98.3°F | Ht 65.0 in | Wt 278.4 lb

## 2014-03-08 DIAGNOSIS — G4733 Obstructive sleep apnea (adult) (pediatric): Secondary | ICD-10-CM

## 2014-03-08 DIAGNOSIS — J45909 Unspecified asthma, uncomplicated: Secondary | ICD-10-CM

## 2014-03-08 MED ORDER — MONTELUKAST SODIUM 10 MG PO TABS: ORAL_TABLET | ORAL | Status: DC

## 2014-03-08 MED ORDER — ALBUTEROL SULFATE HFA 108 (90 BASE) MCG/ACT IN AERS: 2.0000 | INHALATION_SPRAY | RESPIRATORY_TRACT | Status: DC | PRN

## 2014-03-08 MED ORDER — ALBUTEROL SULFATE HFA 108 (90 BASE) MCG/ACT IN AERS: 2.0000 | INHALATION_SPRAY | Freq: Two times a day (BID) | RESPIRATORY_TRACT | Status: DC | PRN

## 2014-03-08 NOTE — Progress Notes (Signed)
  Subjective:    Patient ID: [name], female    DOB: 05/27/1966, 48 y.o.   MRN: [id number]  HPI   48 year old never smoker presents for FU of asthma and mod obstructive sleep apnea.  Asthma was diagnosed in 2006. Her triggers include uri and weather changes. She has seasonal allergies and her sinuses act up in fall. 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 She has not had formal allergy testing.  After an exacerbation in December 2012, she was placed on home oxygen -this was dc'd by [place]  01/29/2012 Spirometry showed no airway obstruction, FEV1 was 82% FVC was 79% ratio 79  PSG showed moderate obstructive sleep apnea - AHI 18/h  Download 5/13 showed good compliance 5h 45 m, AHI 3/h, avg pr 10 cm, leak +  Download 6/13 - good usage on 10 cm ,Mild leak ok     07/29/13   She is supposed to be on 10 cm per last download.  50m download 07/06/13 on 10 cm >> no residuals, leak OK, good usage> 5h  Placed on auto set after by Dr wert   [name] self dc'd int he past - but symptoms worse hence restarted , now has stopped again x 1 mnth No flares this year >  03/08/2014 Follow up OSA / Asthma  Returns for 50 month follow up  Reports Asthma is doing well,  No flare in wheezing/dyspnea.  No ER /hospital visits.   Wearing CPAP at least 4 hours but does skip nights sometimes. Denies significant daytime sleepiness.  On Phentermine by PCP , Wt down 12 lbs .   Denies chest pain, orthopnea, PND, or leg swelling   Review of Systems  neg for any significant sore throat, dysphagia, itching, sneezing, nasal congestion or excess/ purulent secretions, fever, chills, sweats, unintended wt loss, pleuritic or exertional cp, hempoptysis, orthopnea pnd or change in chronic leg swelling. Also denies presyncope, palpitations, heartburn, abdominal pain, nausea, vomiting, diarrhea or  change in bowel or urinary habits, dysuria,hematuria, rash, arthralgias, visual complaints, headache, numbness weakness or ataxia.     Objective:   Physical Exam   Gen. Pleasant, obese, in no distress ENT - no lesions, no post nasal drip Neck: No JVD, no thyromegaly, no carotid bruits Lungs: no use of accessory muscles, no dullness to percussion, decreased without rales or rhonchi  Cardiovascular: Rhythm regular, heart sounds  normal, no murmurs or gallops, no peripheral edema Musculoskeletal: No deformities, no cyanosis or clubbing , no tremors       Assessment & Plan:

## 2014-03-08 NOTE — Assessment & Plan Note (Signed)
Obstructive sleep apnea. Continue on nocturnal sleep download was requested   Encouraged on wt loss

## 2014-03-08 NOTE — Patient Instructions (Addendum)
Continue on current regimen. Wear CPAP each night. Continued to work on Weight loss Exercise as tolerated. Continue on Symbicort and Singulair.  Follow up with Dr. Vassie Loll in 6 months and as needed

## 2014-03-08 NOTE — Addendum Note (Signed)
Addended by: Boone Master E on: 03/08/2014 03:00 PM   Modules accepted: Orders

## 2014-03-08 NOTE — Addendum Note (Signed)
Addended by: Boone Master E on: 03/08/2014 05:40 PM   Modules accepted: Orders

## 2014-03-08 NOTE — Assessment & Plan Note (Signed)
Controlled on current regimen  Plan  Continue on singular daily. Continue on Symbicort 2 puffs twice daily, rinse after use.

## 2014-03-08 NOTE — Addendum Note (Signed)
Addended by: Gwynneth Aliment A on: 03/08/2014 03:02 PM   Modules accepted: Orders

## 2014-03-08 NOTE — Addendum Note (Signed)
Addended by: Boone Master E on: 03/08/2014 03:08 PM   Modules accepted: Orders

## 2014-03-09 ENCOUNTER — Encounter: Payer: Self-pay | Admitting: Family Medicine

## 2014-03-09 ENCOUNTER — Ambulatory Visit (INDEPENDENT_AMBULATORY_CARE_PROVIDER_SITE_OTHER): Payer: BC Managed Care – PPO | Admitting: Family Medicine

## 2014-03-09 MED ORDER — CLOTRIMAZOLE-BETAMETHASONE 1-0.05 % EX CREA: 1.0000 "application " | TOPICAL_CREAM | Freq: Two times a day (BID) | CUTANEOUS | Status: DC

## 2014-03-09 MED ORDER — PHENTERMINE HCL 37.5 MG PO CAPS: 37.5000 mg | ORAL_CAPSULE | ORAL | Status: DC

## 2014-03-09 NOTE — Progress Notes (Signed)
   Subjective:    Patient ID: [name], female    DOB: 02/02/1966, 48 y.o.   MRN: [id number]  HPI Obesity- chronic problem, has lost 8 lbs since starting Phentermine.  BP is normal.  No CP, SOB, HAs, palpitations, insomnia.  Would like refill on phentermine b/c she feels med is helping.   Review of Systems For ROS see HPI     Objective:   Physical Exam  Vitals reviewed. Constitutional: She is oriented to person, place, and time. She appears well-developed and well-nourished. No distress.  HENT:  Head: Normocephalic and atraumatic.  Eyes: Conjunctivae and EOM are normal. Pupils are equal, round, and reactive to light.  Neck: Normal range of motion. Neck supple. No thyromegaly present.  Cardiovascular: Normal rate, regular rhythm, normal heart sounds and intact distal pulses.   No murmur heard. Pulmonary/Chest: Effort normal and breath sounds normal. No respiratory distress.  Abdominal: Soft. She exhibits no distension. There is no tenderness.  Musculoskeletal: She exhibits no edema.  Lymphadenopathy:    She has no cervical adenopathy.  Neurological: She is alert and oriented to person, place, and time.  Skin: Skin is warm and dry.  Psychiatric: She has a normal mood and affect. Her behavior is normal.          Assessment & Plan:

## 2014-03-09 NOTE — Progress Notes (Signed)
Pre visit review using our clinic review tool, if applicable. No additional management support is needed unless otherwise documented below in the visit note. 

## 2014-03-09 NOTE — Patient Instructions (Signed)
Follow up in September to recheck weight loss progress Keep up the good work on healthy food choices and regular exercise!  You look great! Call with any questions or concerns Have a great summer!!!

## 2014-03-09 NOTE — Assessment & Plan Note (Signed)
Applauded pt's recent weight loss efforts.  Asymptomatic on phentermine.  Refill provided.  Will continue to follow.

## 2014-03-14 NOTE — Progress Notes (Signed)
Reviewed & agree with plan  

## 2014-03-17 ENCOUNTER — Ambulatory Visit: Payer: Self-pay | Admitting: Family Medicine

## 2014-03-18 ENCOUNTER — Ambulatory Visit: Payer: Self-pay | Admitting: Family Medicine

## 2014-07-25 ENCOUNTER — Ambulatory Visit: Payer: BC Managed Care – PPO | Admitting: Family Medicine

## 2014-07-25 ENCOUNTER — Telehealth: Payer: Self-pay | Admitting: *Deleted

## 2014-07-25 DIAGNOSIS — Z0289 Encounter for other administrative examinations: Secondary | ICD-10-CM

## 2014-07-25 NOTE — Telephone Encounter (Signed)
Pt showed up late for appointment 07/25/2014 at 1:15pm.  Rescheduled.

## 2014-07-28 LAB — HM PAP SMEAR: HM Pap smear: NORMAL

## 2014-07-29 ENCOUNTER — Ambulatory Visit (INDEPENDENT_AMBULATORY_CARE_PROVIDER_SITE_OTHER): Payer: BC Managed Care – PPO | Admitting: Family Medicine

## 2014-07-29 ENCOUNTER — Encounter: Payer: Self-pay | Admitting: Family Medicine

## 2014-07-29 VITALS — BP 122/76 | HR 79 | Temp 98.2°F | Resp 16 | Ht 65.0 in | Wt 272.5 lb

## 2014-07-29 DIAGNOSIS — B353 Tinea pedis: Secondary | ICD-10-CM

## 2014-07-29 DIAGNOSIS — Z23 Encounter for immunization: Secondary | ICD-10-CM

## 2014-07-29 MED ORDER — TERBINAFINE HCL 250 MG PO TABS: 250.0000 mg | ORAL_TABLET | Freq: Every day | ORAL | Status: DC

## 2014-07-29 NOTE — Progress Notes (Signed)
   Subjective:    Patient ID: [name], female    DOB: 07/21/1966, 48 y.o.   MRN: [id number]  HPI Weight loss progress- pt has lost 3 more lbs since last visit in May for total of 11 lbs.  Pt ran out of phentermine ~3 weeks ago.  Pt is frustrated b/c she feels like things are 'at a standstill'.  Lowest pt has been in 266, highest 298.  Exercising regularly.  Trying to make healthy food choices.  No CP, SOB, HAs, visual changes, edema.   Review of Systems For ROS see HPI     Objective:   Physical Exam  Vitals reviewed. Constitutional: She is oriented to person, place, and time. She appears well-developed and well-nourished. No distress.  HENT:  Head: Normocephalic and atraumatic.  Eyes: Conjunctivae and EOM are normal. Pupils are equal, round, and reactive to light.  Neck: Normal range of motion. Neck supple. No thyromegaly present.  Cardiovascular: Normal rate, regular rhythm, normal heart sounds and intact distal pulses.   No murmur heard. Pulmonary/Chest: Effort normal and breath sounds normal. No respiratory distress.  Abdominal: Soft. She exhibits no distension. There is no tenderness.  Musculoskeletal: She exhibits no edema.  Lymphadenopathy:    She has no cervical adenopathy.  Neurological: She is alert and oriented to person, place, and time.  Skin: Skin is warm and dry.  R foot rash consistent w/ fungal infxn  Psychiatric: She has a normal mood and affect. Her behavior is normal.          Assessment & Plan:

## 2014-07-29 NOTE — Patient Instructions (Signed)
Schedule your complete physical for March Continue to make healthy food choices and get regular exercise Start the Terbinafine daily x2 weeks for the foot Call with any questions or concerns Keep up the good work!  You can do this!

## 2014-07-29 NOTE — Assessment & Plan Note (Signed)
Pt continues to lose weight.  Applauded her efforts.  Exercising regularly and attempting to make healthy food choices.  Will continue to follow.

## 2014-07-29 NOTE — Progress Notes (Signed)
Pre visit review using our clinic review tool, if applicable. No additional management support is needed unless otherwise documented below in the visit note. 

## 2014-07-29 NOTE — Assessment & Plan Note (Signed)
Pt has failed ongoing tx w/ topical meds.  Start oral antifungals and monitor for improvement.  Pt expressed understanding and is in agreement w/ plan.

## 2014-08-02 LAB — HM MAMMOGRAPHY: HM Mammogram: NORMAL

## 2014-08-04 ENCOUNTER — Other Ambulatory Visit (HOSPITAL_COMMUNITY): Payer: Self-pay | Admitting: Obstetrics and Gynecology

## 2014-08-04 DIAGNOSIS — Z1231 Encounter for screening mammogram for malignant neoplasm of breast: Secondary | ICD-10-CM

## 2014-08-11 ENCOUNTER — Ambulatory Visit (HOSPITAL_COMMUNITY): Payer: Self-pay

## 2014-08-15 ENCOUNTER — Ambulatory Visit (HOSPITAL_COMMUNITY): Payer: Self-pay

## 2014-08-22 ENCOUNTER — Encounter: Payer: Self-pay | Admitting: Family Medicine

## 2014-08-22 ENCOUNTER — Ambulatory Visit (INDEPENDENT_AMBULATORY_CARE_PROVIDER_SITE_OTHER): Payer: BC Managed Care – PPO | Admitting: Family Medicine

## 2014-08-22 VITALS — BP 122/86 | HR 78 | Temp 98.4°F | Resp 17 | Wt 273.1 lb

## 2014-08-22 DIAGNOSIS — J4541 Moderate persistent asthma with (acute) exacerbation: Secondary | ICD-10-CM

## 2014-08-22 MED ORDER — AMOXICILLIN 875 MG PO TABS: 875.0000 mg | ORAL_TABLET | Freq: Two times a day (BID) | ORAL | Status: DC

## 2014-08-22 MED ORDER — IPRATROPIUM-ALBUTEROL 0.5-2.5 (3) MG/3ML IN SOLN
3.0000 mL | Freq: Once | RESPIRATORY_TRACT | Status: AC
  Administered 2014-08-22: 3 mL via RESPIRATORY_TRACT

## 2014-08-22 MED ORDER — PROMETHAZINE-DM 6.25-15 MG/5ML PO SYRP: 5.0000 mL | ORAL_SOLUTION | Freq: Four times a day (QID) | ORAL | Status: DC | PRN

## 2014-08-22 NOTE — Assessment & Plan Note (Signed)
Pt's sxs and PE consistent w/ acute sinusitis and asthma exacerbation.  Start abx.  Cough meds prn.  Continue home asthma regimen.  Reviewed supportive care and red flags that should prompt return.  Pt expressed understanding and is in agreement w/ plan.

## 2014-08-22 NOTE — Progress Notes (Signed)
Pre visit review using our clinic review tool, if applicable. No additional management support is needed unless otherwise documented below in the visit note. 

## 2014-08-22 NOTE — Progress Notes (Signed)
   Subjective:    Patient ID: [name], female    DOB: 03/17/1966, 48 y.o.   MRN: [id number]  HPI URI- sxs 'started as cold' 2-3 weeks ago.  Sore throat has resolved but cough is worse.  Increased wheezing (hx of asthma)- had to use nebulizer last night.  + SOB.  + nasal congestion, PND, productive cough.  No fevers.  Mild sinus pain/pressure.  + sick contacts.   Review of Systems For ROS see HPI     Objective:   Physical Exam  Constitutional: She appears well-developed and well-nourished. No distress.  HENT:  Head: Normocephalic and atraumatic.  Right Ear: Tympanic membrane normal.  Left Ear: Tympanic membrane normal.  Nose: Mucosal edema and rhinorrhea present. Right sinus exhibits maxillary sinus tenderness and frontal sinus tenderness. Left sinus exhibits maxillary sinus tenderness and frontal sinus tenderness.  Mouth/Throat: Uvula is midline and mucous membranes are normal. Posterior oropharyngeal erythema present. No oropharyngeal exudate.  Eyes: Conjunctivae and EOM are normal. Pupils are equal, round, and reactive to light.  Neck: Normal range of motion. Neck supple.  Cardiovascular: Normal rate, regular rhythm and normal heart sounds.   Pulmonary/Chest: Effort normal. No respiratory distress. She has wheezes (R sided wheezes- almost a whistle- improved s/p neb tx).  + hacking cough  Lymphadenopathy:    She has no cervical adenopathy.  Vitals reviewed.         Assessment & Plan:

## 2014-08-22 NOTE — Patient Instructions (Signed)
Follow up as needed Start the Amoxicillin twice daily- take w/ food- for infection Use the cough syrup as needed- will cause drowsiness Mucinex DM for daytime cough (no drowsiness) Drink plenty of fluids REST! Continue to use your rescue inhaler as needed Call with any questions or concerns Hang in there!

## 2014-09-05 ENCOUNTER — Telehealth: Payer: Self-pay | Admitting: Family Medicine

## 2014-09-05 MED ORDER — PHENTERMINE HCL 37.5 MG PO CAPS: 37.5000 mg | ORAL_CAPSULE | ORAL | Status: DC

## 2014-09-05 NOTE — Telephone Encounter (Signed)
Ok for #30, 1 refill.  Pt will need appt in 6-8 weeks to assess weight loss progress and BP

## 2014-09-05 NOTE — Telephone Encounter (Signed)
lmovm notifying pt that rx was available at the front desk.

## 2014-09-05 NOTE — Telephone Encounter (Signed)
Caller name: [name], Lamyia Relation to pt: self  Call back number: [phone] Pharmacy: [facility] [address], [place], [place] [address] [phone]    Reason for call:  Pt requesting a refill of phentermine 37.5 MG capsule. Please send to [facility] [address], [place], [place] [address]  [phone]

## 2014-09-05 NOTE — Telephone Encounter (Signed)
Med filled and pt notified.  

## 2014-09-05 NOTE — Telephone Encounter (Signed)
Last OV 08-22-14 Phentermine last filled 5-27 #30 with 2

## 2014-09-28 ENCOUNTER — Ambulatory Visit (INDEPENDENT_AMBULATORY_CARE_PROVIDER_SITE_OTHER): Payer: BC Managed Care – PPO | Admitting: Pulmonary Disease

## 2014-09-28 ENCOUNTER — Encounter: Payer: Self-pay | Admitting: Pulmonary Disease

## 2014-09-28 VITALS — BP 118/74 | HR 85 | Temp 98.7°F | Ht 65.0 in | Wt 280.6 lb

## 2014-09-28 DIAGNOSIS — G4733 Obstructive sleep apnea (adult) (pediatric): Secondary | ICD-10-CM

## 2014-09-28 DIAGNOSIS — J4541 Moderate persistent asthma with (acute) exacerbation: Secondary | ICD-10-CM

## 2014-09-28 NOTE — Progress Notes (Signed)
   Subjective:    Patient ID: [name], female    DOB: 01/20/1966, 48 y.o.   MRN: [id number]  HPI  48 year old never smoker for FU of asthma and mod obstructive sleep apnea.  Asthma was diagnosed in 2006. Her triggers include uri and weather changes. She has seasonal allergies and her sinuses act up in fall. She takes over-the-counter Zyrtec .She reports intermittent heartburn controlled by over-the-counter medications.She works for [employer] and runs a [employer]. She has not had formal allergy testing.  After an exacerbation in December 2012, she was placed on home oxygen transiently  Significant tests/ events  01/29/2012 Spirometry showed no airway obstruction, FEV1 was 82% FVC was 79% ratio 79  PSG 2013 moderate obstructive sleep apnea - AHI 18/h  Download 5/13 showed good compliance 5h 45 m, AHI 3/h, avg pr 10 cm, leak +  Download 6/13 - good usage on 10 cm ,Mild leak ok    7345m download 07/06/13 on 10 cm >> no residuals, leak OK, good usage> 5h  09/28/2014  Chief Complaint  Patient presents with  . Follow-up    Wears CPAP every night, unable to obtain download; pt will take machine to get download. no complaints   7 month follow-up  She had one flare last month-given Z-Pak She is compliant with CPAP at still does not like this, she is using nasal pillows, she is supposed to be on 10 cm pressure She can tell if she misses C Pap for more than 2 nights Weight is down by 14 pounds compared to last year but unchanged from 02/2014 States compliant with Symbicort-but this is very expensive Singulair was restarted in the past. Denies chest pain, orthopnea, PND, or leg swelling    Review of Systems neg for any significant sore throat, dysphagia, itching, sneezing, nasal congestion or excess/ purulent secretions, fever, chills, sweats, unintended wt loss, pleuritic or exertional cp, hempoptysis, orthopnea pnd or change in chronic leg swelling. Also denies presyncope,  palpitations, heartburn, abdominal pain, nausea, vomiting, diarrhea or change in bowel or urinary habits, dysuria,hematuria, rash, arthralgias, visual complaints, headache, numbness weakness or ataxia.     Objective:   Physical Exam  Gen. Pleasant, obese, in no distress ENT - no lesions, no post nasal drip Neck: No JVD, no thyromegaly, no carotid bruits Lungs: no use of accessory muscles, no dullness to percussion, decreased without rales or rhonchi  Cardiovascular: Rhythm regular, heart sounds  normal, no murmurs or gallops, no peripheral edema Musculoskeletal: No deformities, no cyanosis or clubbing , no tremors       Assessment & Plan:

## 2014-09-28 NOTE — Patient Instructions (Signed)
Stay on symbicort  CPAP  We discussed weight loss

## 2014-09-28 NOTE — Assessment & Plan Note (Signed)
Continues to be moderate persistent nature. She does need the Symbicort and Singulair Prior attempts to taper this have resulted in flares

## 2014-09-28 NOTE — Assessment & Plan Note (Signed)
Weight loss encouraged, compliance with goal of at least 4-6 hrs every night is the expectation. Advised against medications with sedative side effects Cautioned against driving when sleepy - understanding that sleepiness will vary on a day to day basis  We discussed alternatives including dental appliance-would prefer that she lose weight. If so then the severity of her sleep apnea may fall into the mild category

## 2014-11-23 ENCOUNTER — Encounter: Payer: Self-pay | Admitting: Family Medicine

## 2014-11-23 ENCOUNTER — Ambulatory Visit: Payer: BLUE CROSS/BLUE SHIELD | Admitting: Family Medicine

## 2014-11-23 ENCOUNTER — Telehealth: Payer: Self-pay | Admitting: Family Medicine

## 2014-11-23 NOTE — Telephone Encounter (Signed)
Yes- pt needs no show fee 

## 2014-11-23 NOTE — Telephone Encounter (Signed)
Pt had 8 wk fu appt, it is a no show. Do you want us to charge no show?

## 2014-11-28 ENCOUNTER — Ambulatory Visit: Payer: BLUE CROSS/BLUE SHIELD | Admitting: Family Medicine

## 2014-12-05 ENCOUNTER — Ambulatory Visit (INDEPENDENT_AMBULATORY_CARE_PROVIDER_SITE_OTHER): Payer: BLUE CROSS/BLUE SHIELD | Admitting: Family Medicine

## 2014-12-05 ENCOUNTER — Ambulatory Visit (HOSPITAL_BASED_OUTPATIENT_CLINIC_OR_DEPARTMENT_OTHER)
Admission: RE | Admit: 2014-12-05 | Discharge: 2014-12-05 | Disposition: A | Discharge status: Home / Self Care | Payer: BLUE CROSS/BLUE SHIELD | Source: Ambulatory Visit | Attending: Family Medicine | Admitting: Family Medicine

## 2014-12-05 ENCOUNTER — Encounter: Payer: Self-pay | Admitting: Family Medicine

## 2014-12-05 VITALS — BP 122/82 | HR 78 | Temp 98.2°F | Resp 16 | Wt 273.4 lb

## 2014-12-05 DIAGNOSIS — M7732 Calcaneal spur, left foot: Secondary | ICD-10-CM | POA: Diagnosis not present

## 2014-12-05 DIAGNOSIS — X58XXXA Exposure to other specified factors, initial encounter: Secondary | ICD-10-CM | POA: Insufficient documentation

## 2014-12-05 DIAGNOSIS — M79672 Pain in left foot: Secondary | ICD-10-CM | POA: Insufficient documentation

## 2014-12-05 DIAGNOSIS — M795 Residual foreign body in soft tissue: Secondary | ICD-10-CM | POA: Insufficient documentation

## 2014-12-05 NOTE — Assessment & Plan Note (Signed)
New.  Pt's point tenderness and hx of possible foreign body warrants Xray evaluation to determine if glass still present.  If glass is present, will need to be removed by podiatry.  If no glass present, pt will still need podiatry to determine cause of localized pain.  No evidence of infxn or need for abx.  Pt to get xray and start NSAID for pain relief.  Reviewed supportive care and red flags that should prompt return.  Pt expressed understanding and is in agreement w/ plan.

## 2014-12-05 NOTE — Patient Instructions (Signed)
Please stop downstairs and get your xray We'll call you with your podiatry appt Ibuprofen and a heel cushion for pain Call with any questions or concerns Hang in there!!!

## 2014-12-05 NOTE — Progress Notes (Signed)
   Subjective:    Patient ID: [name], female    DOB: 12/28/1965, 49 y.o.   MRN: [id number]  HPI L foot pain- heel pain.  Pt is concerned for possible foreign body.  Pt reports she broke a glass ~1 month ago and she did have a piece of glass in her foot but she thought she removed all of it.  Pain is less on the weekend, worse w/ prolonged standing during the week.  No redness.   Review of Systems For ROS see HPI      Objective:   Physical Exam  Constitutional: She appears well-developed and well-nourished. No distress.  Cardiovascular: Intact distal pulses.   Musculoskeletal:  Area of induration at site of previous foreign body on L heel.  + point TTP.  No erythema, drainage.  No surrounding TTP.  Skin: Skin is warm and dry. No erythema.  Vitals reviewed.         Assessment & Plan:

## 2014-12-05 NOTE — Progress Notes (Signed)
Pre visit review using our clinic review tool, if applicable. No additional management support is needed unless otherwise documented below in the visit note. 

## 2014-12-16 ENCOUNTER — Encounter: Payer: Self-pay | Admitting: Podiatry

## 2014-12-16 ENCOUNTER — Ambulatory Visit (INDEPENDENT_AMBULATORY_CARE_PROVIDER_SITE_OTHER): Payer: BLUE CROSS/BLUE SHIELD | Admitting: Podiatry

## 2014-12-16 VITALS — Ht 65.0 in | Wt 273.0 lb

## 2014-12-16 DIAGNOSIS — M79672 Pain in left foot: Secondary | ICD-10-CM | POA: Diagnosis not present

## 2014-12-16 DIAGNOSIS — Q828 Other specified congenital malformations of skin: Secondary | ICD-10-CM

## 2014-12-16 DIAGNOSIS — L923 Foreign body granuloma of the skin and subcutaneous tissue: Secondary | ICD-10-CM | POA: Insufficient documentation

## 2014-12-16 NOTE — Patient Instructions (Signed)
Seen for pain in left heel. Possible foreign body induced porokeratosis. Debrided. Pain relieved.  Return in 2 weeks to repeat. With repeat debridement the foreign body may migrate to surface.
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
## 2014-12-19 ENCOUNTER — Telehealth: Payer: Self-pay

## 2014-12-19 NOTE — Telephone Encounter (Signed)
Left a message for call back.  MMG- 05/07/12-negative DUE' CCS- DUE PAP- 09/24/13 normal; OB-GYN Flu-07/29/14 Tdap- 07/09/11

## 2014-12-20 ENCOUNTER — Encounter: Payer: BLUE CROSS/BLUE SHIELD | Admitting: Family Medicine

## 2014-12-23 ENCOUNTER — Ambulatory Visit (INDEPENDENT_AMBULATORY_CARE_PROVIDER_SITE_OTHER): Payer: BLUE CROSS/BLUE SHIELD | Admitting: Family Medicine

## 2014-12-23 ENCOUNTER — Encounter: Payer: Self-pay | Admitting: Family Medicine

## 2014-12-23 VITALS — BP 122/82 | HR 84 | Temp 98.2°F | Resp 16 | Ht 65.5 in | Wt 271.5 lb

## 2014-12-23 DIAGNOSIS — E010 Iodine-deficiency related diffuse (endemic) goiter: Secondary | ICD-10-CM

## 2014-12-23 DIAGNOSIS — Z0000 Encounter for general adult medical examination without abnormal findings: Secondary | ICD-10-CM | POA: Diagnosis not present

## 2014-12-23 DIAGNOSIS — E049 Nontoxic goiter, unspecified: Secondary | ICD-10-CM

## 2014-12-23 DIAGNOSIS — Z1211 Encounter for screening for malignant neoplasm of colon: Secondary | ICD-10-CM | POA: Diagnosis not present

## 2014-12-23 LAB — CBC WITH DIFFERENTIAL/PLATELET
BASOS PCT: 1 % (ref 0.0–3.0)
Basophils Absolute: 0.1 10*3/uL (ref 0.0–0.1)
Eosinophils Absolute: 0.2 10*3/uL (ref 0.0–0.7)
Eosinophils Relative: 2.9 % (ref 0.0–5.0)
HEMATOCRIT: 39 % (ref 36.0–46.0)
HEMOGLOBIN: 13.1 g/dL (ref 12.0–15.0)
LYMPHS PCT: 43.1 % (ref 12.0–46.0)
Lymphs Abs: 2.3 10*3/uL (ref 0.7–4.0)
MCHC: 33.7 g/dL (ref 30.0–36.0)
MCV: 91 fl (ref 78.0–100.0)
MONO ABS: 0.5 10*3/uL (ref 0.1–1.0)
Monocytes Relative: 8.3 % (ref 3.0–12.0)
NEUTROS ABS: 2.4 10*3/uL (ref 1.4–7.7)
Neutrophils Relative %: 44.7 % (ref 43.0–77.0)
Platelets: 293 10*3/uL (ref 150.0–400.0)
RBC: 4.28 Mil/uL (ref 3.87–5.11)
RDW: 14 % (ref 11.5–15.5)
WBC: 5.4 10*3/uL (ref 4.0–10.5)

## 2014-12-23 LAB — BASIC METABOLIC PANEL
BUN: 14 mg/dL (ref 6–23)
CHLORIDE: 104 meq/L (ref 96–112)
CO2: 31 meq/L (ref 19–32)
Calcium: 9.4 mg/dL (ref 8.4–10.5)
Creatinine, Ser: 0.77 mg/dL (ref 0.40–1.20)
GFR: 101.8 mL/min (ref >60.00)
Glucose, Bld: 93 mg/dL (ref 70–99)
Potassium: 4.1 mEq/L (ref 3.5–5.1)
Sodium: 138 mEq/L (ref 135–145)

## 2014-12-23 LAB — HEPATIC FUNCTION PANEL
ALT: 12 U/L (ref 0–35)
AST: 19 U/L (ref 0–37)
Albumin: 4 g/dL (ref 3.5–5.2)
Alkaline Phosphatase: 60 U/L (ref 39–117)
Bilirubin, Direct: 0 mg/dL (ref 0.0–0.3)
TOTAL PROTEIN: 7.5 g/dL (ref 6.0–8.3)
Total Bilirubin: 0.4 mg/dL (ref 0.2–1.2)

## 2014-12-23 LAB — LIPID PANEL
CHOL/HDL RATIO: 3
CHOLESTEROL: 198 mg/dL (ref 0–200)
HDL: 59.7 mg/dL (ref >39.00)
LDL CALC: 130 mg/dL — AB (ref 0–99)
NonHDL: 138.3
Triglycerides: 40 mg/dL (ref 0.0–149.0)
VLDL: 8 mg/dL (ref 0.0–40.0)

## 2014-12-23 LAB — VITAMIN D 25 HYDROXY (VIT D DEFICIENCY, FRACTURES): VITD: 18.28 ng/mL — AB (ref 30.00–100.00)

## 2014-12-23 LAB — TSH: TSH: 1.18 u[IU]/mL (ref 0.35–4.50)

## 2014-12-23 NOTE — Progress Notes (Signed)
Pre visit review using our clinic review tool, if applicable. No additional management support is needed unless otherwise documented below in the visit note. 

## 2014-12-23 NOTE — Patient Instructions (Signed)
Follow up in 1 year or as needed We'll notify you of your lab results and make any changes if needed We'll call you with your ultrasound appt for the thyroid Continue to work on healthy diet and regular exercise- you can do this! Call with any questions or concerns Happy [place]!!

## 2014-12-23 NOTE — Progress Notes (Signed)
   Subjective:    Patient ID: [name], female    DOB: 09/28/1966, 49 y.o.   MRN: [id number]  HPI CPE- UTD on pap, mammo (Dr Senaida Oresichardson).  Due for colonoscopy.   Review of Systems Patient reports no vision/ hearing changes, adenopathy,fever, weight change,  persistant/recurrent hoarseness , swallowing issues, chest pain, palpitations, edema, persistant/recurrent cough, hemoptysis, dyspnea (rest/exertional/paroxysmal nocturnal), gastrointestinal bleeding (melena, rectal bleeding), abdominal pain, significant heartburn, bowel changes, GU symptoms (dysuria, hematuria, incontinence), Gyn symptoms (abnormal  bleeding, pain),  syncope, focal weakness, memory loss, numbness & tingling, skin/hair/nail changes, abnormal bruising or bleeding, anxiety, or depression.     Objective:   Physical Exam General Appearance:    Alert, cooperative, no distress, appears stated age  Head:    Normocephalic, without obvious abnormality, atraumatic  Eyes:    PERRL, conjunctiva/corneas clear, EOM's intact, fundi    benign, both eyes  Ears:    Normal TM's and external ear canals, both ears  Nose:   Nares normal, septum midline, mucosa normal, no drainage    or sinus tenderness  Throat:   Lips, mucosa, and tongue normal; teeth and gums normal  Neck:   Supple, symmetrical, trachea midline, no adenopathy;    Thyroid: + thyromegaly, R>L  Back:     Symmetric, no curvature, ROM normal, no CVA tenderness  Lungs:     Clear to auscultation bilaterally, respirations unlabored  Chest Wall:    No tenderness or deformity   Heart:    Regular rate and rhythm, S1 and S2 normal, no murmur, rub   or gallop  Breast Exam:    Deferred to GYN  Abdomen:     Soft, non-tender, bowel sounds active all four quadrants,    no masses, no organomegaly  Genitalia:    Deferred to GYN  Rectal:    Extremities:   Extremities normal, atraumatic, no cyanosis or edema  Pulses:   2+ and symmetric all extremities  Skin:   Skin color, texture,  turgor normal, no rashes or lesions  Lymph nodes:   Cervical, supraclavicular, and axillary nodes normal  Neurologic:   CNII-XII intact, normal strength, sensation and reflexes    throughout          Assessment & Plan:

## 2014-12-23 NOTE — Assessment & Plan Note (Signed)
Pt's PE WNL w/ exception of thyromegaly and obesity.  UTD on GYN.  Due for colonoscopy now that she is 48- referral entered.  Check labs.  Anticipatory guidance provided.

## 2014-12-23 NOTE — Assessment & Plan Note (Signed)
New.  R>L w/ possible nodularity.  Get US to assess.

## 2014-12-27 ENCOUNTER — Other Ambulatory Visit: Payer: Self-pay | Admitting: General Practice

## 2014-12-27 MED ORDER — VITAMIN D (ERGOCALCIFEROL) 1.25 MG (50000 UNIT) PO CAPS: 50000.0000 [IU] | ORAL_CAPSULE | ORAL | Status: DC

## 2014-12-28 ENCOUNTER — Encounter: Payer: Self-pay | Admitting: Podiatry

## 2014-12-28 ENCOUNTER — Ambulatory Visit (INDEPENDENT_AMBULATORY_CARE_PROVIDER_SITE_OTHER): Payer: BLUE CROSS/BLUE SHIELD | Admitting: Podiatry

## 2014-12-28 VITALS — BP 139/92 | HR 96

## 2014-12-28 DIAGNOSIS — M79672 Pain in left foot: Secondary | ICD-10-CM

## 2014-12-28 DIAGNOSIS — Q828 Other specified congenital malformations of skin: Secondary | ICD-10-CM

## 2014-12-28 DIAGNOSIS — L923 Foreign body granuloma of the skin and subcutaneous tissue: Secondary | ICD-10-CM

## 2014-12-28 NOTE — Patient Instructions (Signed)
Porokeratotic lesion removed from left heel. Return as needed for repeat treatment.
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
## 2014-12-29 NOTE — Telephone Encounter (Signed)
Unable to reach patient prior to visit  

## 2015-01-16 ENCOUNTER — Other Ambulatory Visit: Payer: Self-pay | Admitting: Medical

## 2015-01-16 ENCOUNTER — Encounter: Payer: Self-pay | Admitting: Medical

## 2015-01-16 ENCOUNTER — Ambulatory Visit (HOSPITAL_BASED_OUTPATIENT_CLINIC_OR_DEPARTMENT_OTHER)
Admission: RE | Admit: 2015-01-16 | Discharge: 2015-01-16 | Disposition: A | Discharge status: Home / Self Care | Payer: BLUE CROSS/BLUE SHIELD | Source: Ambulatory Visit | Attending: Medical | Admitting: Medical

## 2015-01-16 ENCOUNTER — Ambulatory Visit (INDEPENDENT_AMBULATORY_CARE_PROVIDER_SITE_OTHER): Payer: BLUE CROSS/BLUE SHIELD | Admitting: Medical

## 2015-01-16 VITALS — BP 116/73 | HR 99 | Temp 98.4°F | Ht 65.5 in | Wt 271.4 lb

## 2015-01-16 DIAGNOSIS — M79605 Pain in left leg: Secondary | ICD-10-CM | POA: Insufficient documentation

## 2015-01-16 DIAGNOSIS — M546 Pain in thoracic spine: Secondary | ICD-10-CM | POA: Diagnosis not present

## 2015-01-16 DIAGNOSIS — T148XXA Other injury of unspecified body region, initial encounter: Secondary | ICD-10-CM | POA: Insufficient documentation

## 2015-01-16 DIAGNOSIS — M47894 Other spondylosis, thoracic region: Secondary | ICD-10-CM | POA: Diagnosis not present

## 2015-01-16 DIAGNOSIS — T148 Other injury of unspecified body region: Secondary | ICD-10-CM | POA: Diagnosis not present

## 2015-01-16 DIAGNOSIS — M25552 Pain in left hip: Secondary | ICD-10-CM | POA: Diagnosis not present

## 2015-01-16 DIAGNOSIS — M5442 Lumbago with sciatica, left side: Secondary | ICD-10-CM

## 2015-01-16 DIAGNOSIS — M5441 Lumbago with sciatica, right side: Secondary | ICD-10-CM

## 2015-01-16 DIAGNOSIS — M419 Scoliosis, unspecified: Secondary | ICD-10-CM | POA: Insufficient documentation

## 2015-01-16 DIAGNOSIS — M542 Cervicalgia: Secondary | ICD-10-CM | POA: Diagnosis not present

## 2015-01-16 MED ORDER — DICLOFENAC SODIUM 75 MG PO TBEC: 75.0000 mg | DELAYED_RELEASE_TABLET | Freq: Two times a day (BID) | ORAL | Status: DC

## 2015-01-16 MED ORDER — CYCLOBENZAPRINE HCL 10 MG PO TABS: 10.0000 mg | ORAL_TABLET | Freq: Every day | ORAL | Status: DC
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
## 2015-01-16 NOTE — Progress Notes (Signed)
Pre visit review using our clinic review tool, if applicable. No additional management support is needed unless otherwise documented below in the visit note. 
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
## 2015-01-20 ENCOUNTER — Telehealth: Payer: Self-pay | Admitting: Family Medicine

## 2015-01-20 NOTE — Telephone Encounter (Signed)
Pt advised that yes she needs to continue medication for 3 more refills.

## 2015-01-20 NOTE — Telephone Encounter (Signed)
Caller name:Mckenny, Tiaja Relation to WG:NFAOpt:self Call back number:[phone] Pharmacy:  Reason for call: pt would like to know if she is suppose to continue her meds of vitamin D, states she has taken her last pills on Tuesday, states she has 3 more refills, pt wants to know does she get the refills and continue the meds.

## 2015-02-07 ENCOUNTER — Other Ambulatory Visit: Payer: Self-pay | Admitting: Adult Health

## 2015-03-29 ENCOUNTER — Encounter: Payer: Self-pay | Admitting: Adult Health

## 2015-03-29 ENCOUNTER — Ambulatory Visit (INDEPENDENT_AMBULATORY_CARE_PROVIDER_SITE_OTHER): Payer: BLUE CROSS/BLUE SHIELD | Admitting: Adult Health

## 2015-03-29 VITALS — BP 136/86 | HR 78 | Temp 98.3°F | Ht 65.0 in | Wt 280.0 lb

## 2015-03-29 DIAGNOSIS — J45909 Unspecified asthma, uncomplicated: Secondary | ICD-10-CM

## 2015-03-29 DIAGNOSIS — G4733 Obstructive sleep apnea (adult) (pediatric): Secondary | ICD-10-CM | POA: Diagnosis not present

## 2015-03-29 MED ORDER — MONTELUKAST SODIUM 10 MG PO TABS: 10.0000 mg | ORAL_TABLET | Freq: Every day | ORAL | Status: DC

## 2015-03-29 NOTE — Assessment & Plan Note (Signed)
Compensated   Plan  Continue on current regimen.  Exercise as tolerated. Continue on Symbicort and Singulair.  Follow up with Dr. Vassie Loll in 6 months and as needed

## 2015-03-29 NOTE — Assessment & Plan Note (Signed)
Continue on current regimen. Wear CPAP each night. Continued to work on Weight loss Exercise as tolerated.    Follow up with Dr. Vassie Loll in 6 months and as needed

## 2015-03-29 NOTE — Patient Instructions (Addendum)
Continue on current regimen. Wear CPAP each night. Continued to work on Weight loss Exercise as tolerated. Continue on Symbicort and Singulair.  Follow up with Dr. Alva in 6 months and as needed 

## 2015-03-29 NOTE — Progress Notes (Signed)
Reviewed & agree with plan  

## 2015-03-29 NOTE — Progress Notes (Signed)
   Subjective:    Patient ID: [name], female    DOB: 04-02-1966, 49 y.o.   MRN: [id number]  HPI  49 year old never smoker for FU of asthma and mod obstructive sleep apnea.  Asthma was diagnosed in 2006. Her triggers include uri and weather changes. She has seasonal allergies and her sinuses act up in fall. 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 She has not had formal allergy testing.  After an exacerbation in December 2012, she was placed on home oxygen transiently  Significant tests/ events  01/29/2012 Spirometry showed no airway obstruction, FEV1 was 82% FVC was 79% ratio 79  PSG 2013 moderate obstructive sleep apnea - AHI 18/h  Download 5/13 showed good compliance 5h 45 m, AHI 3/h, avg pr 10 cm, leak +  Download 6/13 - good usage on 10 cm ,Mild leak ok    12m download 07/06/13 on 10 cm >> no residuals, leak OK, good usage> 5h  09/28/2014  Chief Complaint  Patient presents with  . Follow-up    Wears CPAP every night, unable to obtain download; pt will take machine to get download. no complaints   7 month follow-up  She had one flare last month-given Z-Pak She is compliant with CPAP at still does not like this, she is using nasal pillows, she is supposed to be on 10 cm pressure She can tell if she misses C Pap for more than 2 nights Weight is down by 14 pounds compared to last year but unchanged from 02/2014 States compliant with Symbicort-but this is very expensive Singulair was restarted in the past. Denies chest pain, orthopnea, PND, or leg swelling  03/29/2015 Follow-up asthma and obstructive sleep apnea Patient returns for six-month follow-up Says since last visit. She's been doing good.  Does not like the hot temps.  Remains on Symbicort twice daily Not any flare of cough or wheezing.  Wears C Pap everynight for at 6 hrs  Denies any mask  issues or significant daytime sleepiness. Feels rested Needs to take chip in for download.   Review of Systems neg for any significant sore throat, dysphagia, itching, sneezing, nasal congestion or excess/ purulent secretions, fever, chills, sweats, unintended wt loss, pleuritic or exertional cp, hempoptysis, orthopnea pnd or change in chronic leg swelling. Also denies presyncope, palpitations, heartburn, abdominal pain, nausea, vomiting, diarrhea or change in bowel or urinary habits, dysuria,hematuria, rash, arthralgias, visual complaints, headache, numbness weakness or ataxia.     Objective:   Physical Exam  Gen. Pleasant, obese, in no distress ENT - no lesions, no post nasal drip Neck: No JVD, no thyromegaly, no carotid bruits Lungs: no use of accessory muscles, no dullness to percussion, decreased without rales or rhonchi  Cardiovascular: Rhythm regular, heart sounds  normal, no murmurs or gallops, no peripheral edema Musculoskeletal: No deformities, no cyanosis or clubbing , no tremors       Assessment & Plan:

## 2015-03-30 ENCOUNTER — Ambulatory Visit: Payer: Self-pay | Admitting: Adult Health

## 2015-04-07 ENCOUNTER — Telehealth: Payer: Self-pay | Admitting: Adult Health

## 2015-04-07 NOTE — Telephone Encounter (Signed)
Pt seen 6.15.16 by TP Download was requested at ov - results received  Per TP: good control/compliance, continue same therapy  LMOM TCB x1 for pt to discuss

## 2015-04-10 NOTE — Telephone Encounter (Signed)
lmomtcb x2 for pt 

## 2015-04-11 NOTE — Telephone Encounter (Signed)
I spoke with patient about results and she verbalized understanding and had no questions 

## 2015-04-11 NOTE — Telephone Encounter (Signed)
Download sent down for scan Will sign off

## 2015-04-11 NOTE — Telephone Encounter (Signed)
Pt returning call.[name] ° °

## 2015-04-19 ENCOUNTER — Ambulatory Visit: Payer: BLUE CROSS/BLUE SHIELD | Admitting: Family Medicine

## 2015-05-24 ENCOUNTER — Encounter: Payer: Self-pay | Admitting: General Practice

## 2015-05-24 ENCOUNTER — Ambulatory Visit (INDEPENDENT_AMBULATORY_CARE_PROVIDER_SITE_OTHER): Payer: BLUE CROSS/BLUE SHIELD | Admitting: Family Medicine

## 2015-05-24 ENCOUNTER — Encounter: Payer: Self-pay | Admitting: Family Medicine

## 2015-05-24 VITALS — BP 142/90 | HR 89 | Temp 98.4°F | Resp 16 | Wt 281.0 lb

## 2015-05-24 DIAGNOSIS — R062 Wheezing: Secondary | ICD-10-CM

## 2015-05-24 DIAGNOSIS — J4541 Moderate persistent asthma with (acute) exacerbation: Secondary | ICD-10-CM | POA: Diagnosis not present

## 2015-05-24 MED ORDER — PREDNISONE 10 MG PO TABS: ORAL_TABLET | ORAL | Status: DC

## 2015-05-24 MED ORDER — IPRATROPIUM-ALBUTEROL 0.5-2.5 (3) MG/3ML IN SOLN
3.0000 mL | Freq: Once | RESPIRATORY_TRACT | Status: AC
  Administered 2015-05-24: 3 mL via RESPIRATORY_TRACT

## 2015-05-24 MED ORDER — PROMETHAZINE-DM 6.25-15 MG/5ML PO SYRP
5.0000 mL | ORAL_SOLUTION | Freq: Four times a day (QID) | ORAL | Status: DC | PRN
Start: 2015-05-24 — End: 2015-08-25

## 2015-05-24 MED ORDER — ALBUTEROL SULFATE (2.5 MG/3ML) 0.083% IN NEBU: 2.5000 mg | INHALATION_SOLUTION | Freq: Four times a day (QID) | RESPIRATORY_TRACT | Status: DC | PRN

## 2015-05-24 MED ORDER — ALBUTEROL SULFATE HFA 108 (90 BASE) MCG/ACT IN AERS: 2.0000 | INHALATION_SPRAY | RESPIRATORY_TRACT | Status: DC | PRN

## 2015-05-24 MED ORDER — AZITHROMYCIN 250 MG PO TABS: ORAL_TABLET | ORAL | Status: DC

## 2015-05-24 NOTE — Assessment & Plan Note (Signed)
Pt's sxs and PE consistent w/ asthma exacerbation.  sxs improved s/p neb tx in the office.  Start abx.  pred taper.  Cough meds.  Reviewed supportive care and red flags that should prompt return.  Pt expressed understanding and is in agreement w/ plan.

## 2015-05-24 NOTE — Progress Notes (Signed)
Pre visit review using our clinic review tool, if applicable. No additional management support is needed unless otherwise documented below in the visit note. 

## 2015-05-24 NOTE — Patient Instructions (Signed)
Follow up as needed Start the Zpack as directed Use the Prednisone as directed- take w/ food- to improve your wheezing Use the cough syrup as needed- will cause drowsiness Mucinex DM for daytime cough Drink plenty of fluids REST! Call with any questions or concerns Hang in there!

## 2015-05-24 NOTE — Progress Notes (Signed)
   Subjective:    Patient ID: [name], female    DOB: 10-May-1966, 49 y.o.   MRN: [id number]  HPI Asthma exacerbation- sxs started 1 week ago.  No fevers.  + cough- productive of thick yellow sputum.  Mild SOB and increased wheezing.  No known sick contacts.  Mild sinus pressure but this is improving.  Bilateral ear fullness.  Using Symbicort daily and albuterol as needed.  Pt is taking Singulair nightly.     Review of Systems For ROS see HPI     Objective:   Physical Exam  Constitutional: She is oriented to person, place, and time. She appears well-developed and well-nourished. No distress.  HENT:  Head: Normocephalic and atraumatic.  TMs normal bilaterally Mild nasal congestion Throat w/out erythema, edema, or exudate  Eyes: Conjunctivae and EOM are normal. Pupils are equal, round, and reactive to light.  Neck: Normal range of motion. Neck supple.  Cardiovascular: Normal rate, regular rhythm, normal heart sounds and intact distal pulses.   No murmur heard. Pulmonary/Chest: Effort normal. No respiratory distress. She has wheezes (diffuse expiratory wheezes, improved s/p neb tx).  + hacking cough  Lymphadenopathy:    She has no cervical adenopathy.  Neurological: She is alert and oriented to person, place, and time.  Skin: Skin is warm and dry.  Vitals reviewed.         Assessment & Plan:

## 2015-08-25 ENCOUNTER — Encounter: Payer: Self-pay | Admitting: Family Medicine

## 2015-08-25 ENCOUNTER — Ambulatory Visit (INDEPENDENT_AMBULATORY_CARE_PROVIDER_SITE_OTHER): Payer: BLUE CROSS/BLUE SHIELD | Admitting: Family Medicine

## 2015-08-25 DIAGNOSIS — Z1211 Encounter for screening for malignant neoplasm of colon: Secondary | ICD-10-CM

## 2015-08-25 DIAGNOSIS — Z23 Encounter for immunization: Secondary | ICD-10-CM

## 2015-08-25 MED ORDER — PHENTERMINE-TOPIRAMATE ER 3.75-23 MG PO CP24: ORAL_CAPSULE | ORAL | Status: DC

## 2015-08-25 NOTE — Progress Notes (Signed)
Pre visit review using our clinic review tool, if applicable. No additional management support is needed unless otherwise documented below in the visit note. 

## 2015-08-25 NOTE — Patient Instructions (Signed)
Follow up in 2-3 months to recheck weight loss progress We'll notify you of your lab results and make any changes if needed Start the Qsymia as directed- 1 capsule daily x2 weeks and then increase to capsules daily We'll call you with your nutrition appt Try and get 30 minutes of activity at least 4x/week Go to [email or web address] for a coupon for the Qsymia We'll call you with your GI appt for the colonoscopy consultation Call with any questions or concerns If you want to join us at the new [place] office, any scheduled appointments will automatically transfer and we will see you at [address], [place], [place] [id number] Happy Holidays!!!

## 2015-08-25 NOTE — Progress Notes (Signed)
   Subjective:    Patient ID: [name], female    DOB: 05/25/1966, 49 y.o.   MRN: [id number]  HPI Obesity- pt continues to gain weight.  Up 7 lbs since August.  Pt is not exercising regularly.  Has recently been on prednisone for heel pain.  Pt plans to resume walking.  Not following particular diet- attempting to cut back on fried food.  Open to idea of seeing a nutritionist.  No CP, SOB, HAs, visual changes, edema, abd pain, N/V.   Review of Systems For ROS see HPI     Objective:   Physical Exam  Constitutional: She is oriented to person, place, and time. She appears well-developed and well-nourished. No distress.  Morbidly obese  HENT:  Head: Normocephalic and atraumatic.  Eyes: Conjunctivae and EOM are normal. Pupils are equal, round, and reactive to light.  Neck: Normal range of motion. Neck supple. No thyromegaly present.  Cardiovascular: Normal rate, regular rhythm, normal heart sounds and intact distal pulses.   No murmur heard. Pulmonary/Chest: Effort normal and breath sounds normal. No respiratory distress.  Abdominal: Soft. She exhibits no distension. There is no tenderness.  Musculoskeletal: She exhibits no edema.  Lymphadenopathy:    She has no cervical adenopathy.  Neurological: She is alert and oriented to person, place, and time.  Skin: Skin is warm and dry.  Psychiatric: She has a normal mood and affect. Her behavior is normal.  Vitals reviewed.         Assessment & Plan:

## 2015-08-25 NOTE — Assessment & Plan Note (Signed)
Deteriorated.  Pt continues to gain weight.  She is not following a particular diet or exercising w/ any regularity.  Stressed need for pt to pay attention to food intake and get at least 30 minutes of activity 4x/week.  Will refer to nutritionist for ongoing management.  Start Qsymia daily to assist w/ weight loss.  Check labs to risk stratify due to obesity.  Will continue to follow closely.
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
## 2015-09-11 ENCOUNTER — Other Ambulatory Visit (INDEPENDENT_AMBULATORY_CARE_PROVIDER_SITE_OTHER): Payer: BLUE CROSS/BLUE SHIELD

## 2015-09-11 ENCOUNTER — Encounter: Payer: Self-pay | Admitting: General Practice

## 2015-09-11 LAB — CBC WITH DIFFERENTIAL/PLATELET
BASOS ABS: 0 10*3/uL (ref 0.0–0.1)
Basophils Relative: 0.5 % (ref 0.0–3.0)
Eosinophils Absolute: 0.2 10*3/uL (ref 0.0–0.7)
Eosinophils Relative: 3.6 % (ref 0.0–5.0)
HEMATOCRIT: 37.1 % (ref 36.0–46.0)
Hemoglobin: 12.1 g/dL (ref 12.0–15.0)
LYMPHS PCT: 29.6 % (ref 12.0–46.0)
Lymphs Abs: 1.8 10*3/uL (ref 0.7–4.0)
MCHC: 32.7 g/dL (ref 30.0–36.0)
MCV: 93.1 fl (ref 78.0–100.0)
Monocytes Absolute: 0.5 10*3/uL (ref 0.1–1.0)
Monocytes Relative: 7.8 % (ref 3.0–12.0)
NEUTROS ABS: 3.6 10*3/uL (ref 1.4–7.7)
Neutrophils Relative %: 58.5 % (ref 43.0–77.0)
Platelets: 241 10*3/uL (ref 150.0–400.0)
RBC: 3.98 Mil/uL (ref 3.87–5.11)
RDW: 14.7 % (ref 11.5–15.5)
WBC: 6.2 10*3/uL (ref 4.0–10.5)

## 2015-09-11 LAB — LIPID PANEL
CHOLESTEROL: 184 mg/dL (ref 0–200)
HDL: 59.9 mg/dL (ref >39.00)
LDL CALC: 107 mg/dL — AB (ref 0–99)
NonHDL: 123.95
TRIGLYCERIDES: 83 mg/dL (ref 0.0–149.0)
Total CHOL/HDL Ratio: 3
VLDL: 16.6 mg/dL (ref 0.0–40.0)

## 2015-09-11 LAB — HEPATIC FUNCTION PANEL
ALBUMIN: 3.6 g/dL (ref 3.5–5.2)
ALK PHOS: 52 U/L (ref 39–117)
ALT: 13 U/L (ref 0–35)
AST: 18 U/L (ref 0–37)
Bilirubin, Direct: 0.1 mg/dL (ref 0.0–0.3)
TOTAL PROTEIN: 6.7 g/dL (ref 6.0–8.3)
Total Bilirubin: 0.4 mg/dL (ref 0.2–1.2)

## 2015-09-11 LAB — BASIC METABOLIC PANEL
BUN: 9 mg/dL (ref 6–23)
CALCIUM: 9.2 mg/dL (ref 8.4–10.5)
CO2: 30 meq/L (ref 19–32)
Chloride: 103 mEq/L (ref 96–112)
Creatinine, Ser: 0.55 mg/dL (ref 0.40–1.20)
GFR: 149.67 mL/min (ref >60.00)
GLUCOSE: 95 mg/dL (ref 70–99)
Potassium: 3.6 mEq/L (ref 3.5–5.1)
SODIUM: 140 meq/L (ref 135–145)

## 2015-09-11 LAB — HEMOGLOBIN A1C: HEMOGLOBIN A1C: 6.1 % (ref 4.6–6.5)

## 2015-09-11 LAB — TSH: TSH: 1.8 u[IU]/mL (ref 0.35–4.50)

## 2015-10-12 ENCOUNTER — Ambulatory Visit (AMBULATORY_SURGERY_CENTER): Payer: Self-pay | Admitting: *Deleted

## 2015-10-12 VITALS — Ht 65.0 in | Wt 292.0 lb

## 2015-10-12 DIAGNOSIS — Z1211 Encounter for screening for malignant neoplasm of colon: Secondary | ICD-10-CM

## 2015-10-12 MED ORDER — NA SULFATE-K SULFATE-MG SULF 17.5-3.13-1.6 GM/177ML PO SOLN: 1.0000 | Freq: Once | ORAL | Status: DC

## 2015-10-12 NOTE — Progress Notes (Signed)
No egg or soy allergy known to patient  No issues with past sedation with any surgeries  or procedures, no intubation problems  Pt takes Qsymia which contains phentermine. Instructed to stop before the procedure starting today 12-29 Thursday . No more until after 10-19-15 No home 02 use per patient   emmi video to e mail [email or web address]

## 2015-10-17 ENCOUNTER — Ambulatory Visit: Payer: Self-pay | Admitting: Skilled Nursing Facility1

## 2015-10-19 ENCOUNTER — Ambulatory Visit (AMBULATORY_SURGERY_CENTER): Payer: BLUE CROSS/BLUE SHIELD | Admitting: Gastroenterology

## 2015-10-19 ENCOUNTER — Encounter: Payer: Self-pay | Admitting: Gastroenterology

## 2015-10-19 VITALS — BP 136/78 | HR 83 | Temp 99.4°F | Resp 17 | Ht 65.0 in | Wt 292.0 lb

## 2015-10-19 DIAGNOSIS — Z1211 Encounter for screening for malignant neoplasm of colon: Secondary | ICD-10-CM

## 2015-10-19 MED ORDER — SODIUM CHLORIDE 0.9 % IV SOLN: 500.0000 mL | INTRAVENOUS | Status: DC

## 2015-10-19 NOTE — Patient Instructions (Signed)
YOU HAD AN ENDOSCOPIC PROCEDURE TODAY AT THE [facility]:   Refer to the procedure report that was given to you for any specific questions about what was found during the examination.  If the procedure report does not answer your questions, please call your gastroenterologist to clarify.  If you requested that your care partner not be given the details of your procedure findings, then the procedure report has been included in a sealed envelope for you to review at your convenience later.  YOU SHOULD EXPECT: Some feelings of bloating in the abdomen. Passage of more gas than usual.  Walking can help get rid of the air that was put into your GI tract during the procedure and reduce the bloating. If you had a lower endoscopy (such as a colonoscopy or flexible sigmoidoscopy) you may notice spotting of blood in your stool or on the toilet paper. If you underwent a bowel prep for your procedure, you may not have a normal bowel movement for a few days.  Please Note:  You might notice some irritation and congestion in your nose or some drainage.  This is from the oxygen used during your procedure.  There is no need for concern and it should clear up in a day or so.  SYMPTOMS TO REPORT IMMEDIATELY:   Following lower endoscopy (colonoscopy or flexible sigmoidoscopy):  Excessive amounts of blood in the stool  Significant tenderness or worsening of abdominal pains  Swelling of the abdomen that is new, acute  Fever of 100F or higher   For urgent or emergent issues, a gastroenterologist can be reached at any hour by calling [phone].   DIET: Your first meal following the procedure should be a small meal and then it is ok to progress to your normal diet. Heavy or fried foods are harder to digest and may make you feel nauseous or bloated.  Likewise, meals heavy in dairy and vegetables can increase bloating.  Drink plenty of fluids but you should avoid alcoholic beverages for 24  hours.  ACTIVITY:  You should plan to take it easy for the rest of today and you should NOT DRIVE or use heavy machinery until tomorrow (because of the sedation medicines used during the test).    FOLLOW UP: Our staff will call the number listed on your records the next business day following your procedure to check on you and address any questions or concerns that you may have regarding the information given to you following your procedure. If we do not reach you, we will leave a message.  However, if you are feeling well and you are not experiencing any problems, there is no need to return our call.  We will assume that you have returned to your regular daily activities without incident.  If any biopsies were taken you will be contacted by phone or by letter within the next 1-3 weeks.  Please call us at [phone] if you have not heard about the biopsies in 3 weeks.    SIGNATURES/CONFIDENTIALITY: You and/or your care partner have signed paperwork which will be entered into your electronic medical record.  These signatures attest to the fact that that the information above on your After Visit Summary has been reviewed and is understood.  Full responsibility of the confidentiality of this discharge information lies with you and/or your care-partner. 

## 2015-10-19 NOTE — Op Note (Signed)
[facility] [address] [place], [id number]   COLONOSCOPY PROCEDURE REPORT  PATIENT: [name], [name]  MR#: [id number] BIRTHDATE: 20-Mar-1966 , 49  yrs. old GENDER: female ENDOSCOPIST: Meryl DareMalcolm T Kalieb Freeland, MD, Clementeen GrahamFACG REFERRED BY: Sheliah HatchKatherine E Tabori, M.D. PROCEDURE DATE:  10/19/2015 PROCEDURE:   Colonoscopy, screening First Screening Colonoscopy - Avg.  risk and is 50 yrs.  old or older Yes.  Prior Negative Screening - Now for repeat screening. N/A  History of Adenoma - Now for follow-up colonoscopy & has been > or = to 3 yrs.  N/A  Polyps removed today? No Recommend repeat exam, <10 yrs? No ASA CLASS:   Class II INDICATIONS:Screening for colonic neoplasia and Colorectal Neoplasm Risk Assessment for this procedure is average risk. MEDICATIONS: Monitored anesthesia care and Propofol 250 mg IV DESCRIPTION OF PROCEDURE:   After the risks benefits and alternatives of the procedure were thoroughly explained, informed consent was obtained.  The digital rectal exam revealed no abnormalities of the rectum.   The LB PFC-H190 [id number]  endoscope was introduced through the anus and advanced to the cecum, which was identified by both the appendix and ileocecal valve. No adverse events experienced with a tortuous colon.   The quality of the prep was excellent.  (Suprep was used)  The instrument was then slowly withdrawn as the colon was fully examined. Estimated blood loss is zero unless otherwise noted in this procedure report.    COLON FINDINGS: A normal appearing cecum, ileocecal valve, and appendiceal orifice were identified.  The ascending, transverse, descending, sigmoid colon, and rectum appeared unremarkable. Retroflexed views revealed no abnormalities. The time to cecum = 3.9 Withdrawal time = 11.1   The scope was withdrawn and the procedure completed. COMPLICATIONS: There were no immediate complications.  ENDOSCOPIC IMPRESSION: Normal  colonoscopy  RECOMMENDATIONS: Continue current colorectal screening recommendations for "routine risk" patients with a repeat colonoscopy in 10 years.  eSigned:  Meryl DareMalcolm T Jahred Tatar, MD, [facility] 10/19/2015 10:50 AM

## 2015-10-19 NOTE — Progress Notes (Signed)
A/ox3 pleased with MAC, report to Karen RN 

## 2015-10-20 ENCOUNTER — Telehealth: Payer: Self-pay

## 2015-10-20 NOTE — Telephone Encounter (Signed)
  Follow up Call-  Call back number 10/19/2015  Post procedure Call Back phone  # [phone]  Permission to leave phone message Yes     Patient questions:  Do you have a fever, pain , or abdominal swelling? No. Pain Score  0 *  Have you tolerated food without any problems? Yes.    Have you been able to return to your normal activities? Yes.    Do you have any questions about your discharge instructions: Diet   No. Medications  No. Follow up visit  No.  Do you have questions or concerns about your Care? No.  Actions: * If pain score is 4 or above: No action needed, pain <4.

## 2015-10-26 ENCOUNTER — Encounter: Payer: BLUE CROSS/BLUE SHIELD | Attending: Family Medicine | Admitting: Skilled Nursing Facility1

## 2015-10-26 ENCOUNTER — Encounter: Payer: Self-pay | Admitting: Skilled Nursing Facility1

## 2015-10-26 VITALS — Ht 65.0 in | Wt 291.0 lb

## 2015-10-26 DIAGNOSIS — Z6841 Body Mass Index (BMI) 40.0 and over, adult: Secondary | ICD-10-CM | POA: Diagnosis not present

## 2015-10-26 DIAGNOSIS — E669 Obesity, unspecified: Secondary | ICD-10-CM | POA: Insufficient documentation

## 2015-10-26 DIAGNOSIS — Z713 Dietary counseling and surveillance: Secondary | ICD-10-CM | POA: Insufficient documentation

## 2015-10-26 NOTE — Patient Instructions (Signed)
-  Try to use your walking tape 3-4 days a week -A meal: carbohydrate, protein, vegetables: vegetables taking up half the plate -Limit meals outside the home to once a week or less

## 2015-10-26 NOTE — Progress Notes (Signed)
  Medical Nutrition Therapy:  Appt start time: 0900 end time:  1000.   Assessment:  Primary concerns today: referred for obesity. Pt states she would like to lose weight.  Pt states she sleeps with a C-PAP since 2 years ago and has greatly approved. Pt states she eats in front of the television. Pt states she lives with her daughter, her kids, and husband. Pt states she does grocery shopping and cooking. Pt states after her second child she gained weight in a clothing size. Pts diet hx: doctor perscribed phenteramine and took them for 6 months-1 year: lost 30-40 pounds; maintained for 1 year; started it again 2 weeks ago and lost 8-10 pounds; pt states she wanted to lose the wt again. Pt states she eats fried food once or twice a week.  Pt labs: A1C 6.1, LDL cholesterol 107  Preferred Learning Style:   No preference indicated   Learning Readiness:   Contemplating    MEDICATIONS: Phentermine    DIETARY INTAKE:  Usual eating pattern includes 2 meals and 2 snacks per day.  Everyday foods include: none stated.  Avoided foods include: none stated.    24-hr recall:  B ( AM): cereal bar Snk ( AM): none L ( PM): peanut butter/jelly sandwhich-----soup------fast food  Snk ( PM): raisins-----apple D ( PM): baked chicken, rice, vegetable Snk ( PM): none Beverages: water, coffee, tea, sweet tea, milk  Usual physical activity: ADL's  Estimated energy needs: 1600 calories 180 g carbohydrates 120 g protein 44 g fat  Progress Towards Goal(s):  In progress.   Nutritional Diagnosis:  Wilsey-3.3 Overweight/obesity As related to overconsumption of high calorie/high fat foods.  As evidenced by patients BMI of 48.4, patient report, and 24 hour recall.    Intervention:  Nutrition counseling for obesity. Dietitian education patient on the importance of physical activity, A1C, balanced meals, and hunger/fullness cues with emphasis on phentermine's influence.  Goals: -Try to use your walking tape  3-4 days a week -A meal: carbohydrate, protein, vegetables: vegetables taking up half the plate -Limit meals outside the home to once a week or less  Teaching Method Utilized:  Visual Auditory   Handouts given during visit include:  Snack Sheet  MyPlate  Barriers to learning/adherence to lifestyle change: perceived time  Demonstrated degree of understanding via:  Teach Back   Monitoring/Evaluation:  Dietary intake, exercise, A1c, and body weight prn.

## 2015-11-01 ENCOUNTER — Ambulatory Visit: Payer: Self-pay | Admitting: Family Medicine

## 2015-11-07 ENCOUNTER — Telehealth: Payer: Self-pay | Admitting: Internal Medicine

## 2015-11-07 LAB — HM PAP SMEAR: HM Pap smear: NORMAL

## 2015-11-07 NOTE — Telephone Encounter (Signed)
Called and spoke to pt. Pt requesting Symbicort 160 sample. Sample has been left up front for pick and appt made with TP on 2.3.17. Pt verbalized understanding and denied any further questions or concerns at this time.

## 2015-11-13 ENCOUNTER — Encounter: Payer: Self-pay | Admitting: Family Medicine

## 2015-11-13 ENCOUNTER — Ambulatory Visit (INDEPENDENT_AMBULATORY_CARE_PROVIDER_SITE_OTHER): Payer: BLUE CROSS/BLUE SHIELD | Admitting: Family Medicine

## 2015-11-13 VITALS — BP 124/82 | HR 96 | Temp 98.4°F | Ht 65.0 in | Wt 290.6 lb

## 2015-11-13 DIAGNOSIS — J309 Allergic rhinitis, unspecified: Secondary | ICD-10-CM | POA: Diagnosis not present

## 2015-11-13 DIAGNOSIS — M79672 Pain in left foot: Secondary | ICD-10-CM | POA: Diagnosis not present

## 2015-11-13 MED ORDER — CETIRIZINE HCL 10 MG PO TABS: 10.0000 mg | ORAL_TABLET | Freq: Every day | ORAL | Status: DC

## 2015-11-13 MED ORDER — PHENTERMINE HCL 37.5 MG PO CAPS: 37.5000 mg | ORAL_CAPSULE | ORAL | Status: DC

## 2015-11-13 MED ORDER — OLOPATADINE HCL 0.1 % OP SOLN: 1.0000 [drp] | Freq: Two times a day (BID) | OPHTHALMIC | Status: DC

## 2015-11-13 MED FILL — PHENTERMINE 37.5 MG TABLET: 37.5 | 30 days supply | Qty: 30 | Fill #0

## 2015-11-13 NOTE — Patient Instructions (Signed)
Schedule your complete physical in 3-4 months Start the Zyrtec daily Use the Patanol drops twice daily for the itchy eyes Continue the Singulair daily Your lungs are clear- this is great news! If your foot pain worsens or doesn't improve- let me know and we can refer for another opinion Call with any questions or concerns If you want to join [place] at the new [place] office, any scheduled appointments will automatically transfer and we will see you at [address] [place] [address] [place], [place], [place] [address] (OPENING [place]) [place] in there!!

## 2015-11-13 NOTE — Assessment & Plan Note (Signed)
Ongoing issue for pt.  She did not start Qsymia as discussed at last visit.  Prefers to start phenteramine daily.  Reviewed risk of elevated BP, tachycardia, insomnia.  Pt aware.  Prescription given.  Applauded her appt w/ Nutrition earlier this month.  Will follow.

## 2015-11-13 NOTE — Progress Notes (Signed)
   Subjective:    Patient ID: [name], female    DOB: [birth year], 50 y.o.   MRN: [id number]  HPI Wheezing- pt reports sxs started ~2 weeks ago w/ itchy eyes and runny nose.  + cough- productive.  No fevers.  + SOB.  Increased use of Albuterol inhaler.  Taking Symbicort daily.  Has appt w/ pulmonary on Friday.  + sick contacts at work.  Taking Singulair daily for allergy and asthma relief.  Not currently on an antihistamine.  Foot pain- pt is going to Dr [name] at the [facility] on [address].  Not sure if she needs a 2nd opinion.  At end of visit, pt asked for referral  Obesity- ongoing issue for pt.  She did not start the Qsymia.  Pt is asking to start short term phentermine to assist w/ weight loss.  She saw nutrition earlier this month.   Review of Systems For ROS see HPI     Objective:   Physical Exam  Constitutional: She is oriented to person, place, and time. She appears well-developed and well-nourished. No distress.  HENT:  Head: Normocephalic and atraumatic.  Right Ear: Tympanic membrane normal.  Left Ear: Tympanic membrane normal.  Nose: Mucosal edema and rhinorrhea present. Right sinus exhibits no maxillary sinus tenderness and no frontal sinus tenderness. Left sinus exhibits no maxillary sinus tenderness and no frontal sinus tenderness.  Mouth/Throat: Mucous membranes are normal. Posterior oropharyngeal erythema (w/ PND) present.  Eyes: Conjunctivae and EOM are normal. Pupils are equal, round, and reactive to light.  Eyes are tearing bilaterally  Neck: Normal range of motion. Neck supple.  Cardiovascular: Normal rate, regular rhythm and normal heart sounds.   Pulmonary/Chest: Effort normal and breath sounds normal. No respiratory distress. She has no wheezes. She has no rales.  No cough heard  Lymphadenopathy:    She has no cervical adenopathy.  Neurological: She is alert and oriented to person, place, and time.  Vitals reviewed.         Assessment & Plan:

## 2015-11-13 NOTE — Assessment & Plan Note (Signed)
Pt continues to have foot pain and this is limiting her ability to exercise which is interfering w/ her weight loss progress.  Refer to new podiatrist for 2nd opinion at pt's request.

## 2015-11-13 NOTE — Assessment & Plan Note (Signed)
New.  Pt's sxs today are consistent w/ allergic rhinitis- which is likely triggering her asthma- and allergic conjunctivitis.  Start daily antihistamine.  Continue Singulair.  Start Patanol eye drops.  Reviewed supportive care and red flags that should prompt return.  Pt expressed understanding and is in agreement w/ plan.

## 2015-11-13 NOTE — Progress Notes (Signed)
Pre visit review using our clinic review tool, if applicable. No additional management support is needed unless otherwise documented below in the visit note. 

## 2015-11-17 ENCOUNTER — Encounter: Payer: Self-pay | Admitting: General Practice

## 2015-11-17 ENCOUNTER — Ambulatory Visit (INDEPENDENT_AMBULATORY_CARE_PROVIDER_SITE_OTHER): Payer: BLUE CROSS/BLUE SHIELD | Admitting: Adult Health

## 2015-11-17 ENCOUNTER — Encounter: Payer: Self-pay | Admitting: Adult Health

## 2015-11-17 VITALS — BP 136/88 | HR 77 | Ht 65.0 in | Wt 292.0 lb

## 2015-11-17 DIAGNOSIS — J4541 Moderate persistent asthma with (acute) exacerbation: Secondary | ICD-10-CM | POA: Diagnosis not present

## 2015-11-17 DIAGNOSIS — G4733 Obstructive sleep apnea (adult) (pediatric): Secondary | ICD-10-CM | POA: Diagnosis not present

## 2015-11-17 MED ORDER — AZITHROMYCIN 250 MG PO TABS: ORAL_TABLET | ORAL | Status: AC

## 2015-11-17 MED ORDER — FLUCONAZOLE 150 MG PO TABS: 150.0000 mg | ORAL_TABLET | Freq: Once | ORAL | Status: DC

## 2015-11-17 NOTE — Progress Notes (Signed)
Subjective:    Patient ID: [name], female    DOB: 07/18/1966, 50 y.o.   MRN: [id number]  HPI  59 -year-old never smoker for FU of asthma and mod obstructive sleep apnea.  Asthma was diagnosed in 2006. Her triggers include uri and weather changes. She has seasonal allergies and her sinuses act up in fall. 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 She has not had formal allergy testing.  After an exacerbation in December 2012, she was placed on home oxygen transiently  Significant tests/ events  01/29/2012 Spirometry showed no airway obstruction, FEV1 was 82% FVC was 79% ratio 79  PSG 2013 moderate obstructive sleep apnea - AHI 18/h  Download 5/13 showed good compliance 5h 45 m, AHI 3/h, avg pr 10 cm, leak +  Download 6/13 - good usage on 10 cm ,Mild leak ok    37m download 07/06/13 on 10 cm >> no residuals, leak OK, good usage> 5h  09/28/2014  11/17/2015 Follow-up asthma and obstructive sleep apnea Patient returns for six-month follow-up Says she is Wearing cpap nightly.  No problems with mask or pressure.   Feels rested.   Complains of 3 weeks of cough, congestion with yellow mucus and intermittent wheezing  and chest tightness . Ran out of Symbicort last week , made it worse. Started back this week with some help.  Says Symbicort is expensive. Co Pay card given.  Denies chest pain, orthopnea, edema or fever.    Past Medical History  Diagnosis Date  . Asthma   . GERD (gastroesophageal reflux disease)   . Allergy   . Bronchitis   . Sleep apnea     uses c pap    Current Outpatient Prescriptions on File Prior to Visit  Medication Sig Dispense Refill  . albuterol (PROVENTIL HFA;VENTOLIN HFA) 108 (90 BASE) MCG/ACT inhaler Inhale 2 puffs into the lungs every 4 (four) hours as needed for wheezing or shortness of breath. 8.5 g 5  . albuterol (PROVENTIL)  (2.5 MG/3ML) 0.083% nebulizer solution Take 3 mLs (2.5 mg total) by nebulization every 6 (six) hours as needed for wheezing. 75 mL 12  . budesonide-formoterol (SYMBICORT) 160-4.5 MCG/ACT inhaler Inhale 2 puffs into the lungs 2 (two) times daily. 1 Inhaler 5  . cetirizine (ZYRTEC) 10 MG tablet Take 1 tablet (10 mg total) by mouth daily. 30 tablet 11  . ibuprofen (ADVIL,MOTRIN) 800 MG tablet prn  0  . montelukast (SINGULAIR) 10 MG tablet Take 1 tablet (10 mg total) by mouth at bedtime. 30 tablet 5  . Nebulizers (COMPRESSOR/NEBULIZER) MISC As directed 1 each 0  . phentermine 37.5 MG capsule Take 1 capsule (37.5 mg total) by mouth every morning. 30 capsule 3  . olopatadine (PATANOL) 0.1 % ophthalmic solution Place 1 drop into both eyes 2 (two) times daily. (Patient not taking: Reported on 11/17/2015) 5 mL 12   No current facility-administered medications on file prior to visit.    Review of Systems neg for any significant sore throat, dysphagia, itching, sneezing, nasal congestion or excess/ purulent secretions, fever, chills, sweats, unintended wt loss, pleuritic or exertional cp, hempoptysis, orthopnea pnd or change in chronic leg swelling. Also denies presyncope, palpitations, heartburn, abdominal pain, nausea, vomiting, diarrhea or change in bowel or urinary habits, dysuria,hematuria, rash, arthralgias, visual complaints, headache, numbness weakness or ataxia.       Objective:   Physical Exam Filed Vitals:   11/17/15  1224  BP: 136/88  Pulse: 77  Height:  (1.651 m)  Weight: 292 lb (132.45 kg)  SpO2: 98%   Body mass index is 48.59 kg/(m^2).   Gen. Pleasant, obese, in no distress ENT - no lesions, no post nasal drip Neck: No JVD, no thyromegaly, no carotid bruits Lungs: no use of accessory muscles, no dullness to percussion, decreased without rales or rhonchi  Cardiovascular: Rhythm regular, heart sounds  normal, no murmurs or gallops, no peripheral edema Musculoskeletal: No  deformities, no cyanosis or clubbing , no tremors       Assessment & Plan:

## 2015-11-17 NOTE — Assessment & Plan Note (Signed)
Wear CPAP each night. Continued to work on Weight loss Exercise as tolerated. Follow up with Dr. Vassie Loll in 6 months and as needed

## 2015-11-17 NOTE — Assessment & Plan Note (Signed)
Mild flare with bronchitis  abx rx , (request rx for diflucan d/t yeast infection on abx. )   Plan  Zpack take as directed.  Mucinex DM Twice daily  As needed cough/congestion .  Eat yogurt daily while on antibiotic.  Diflucan  x 1 if your develop yeast infection  Continue on Symbicort and Singulair.  Symbicort copay card given.  Follow up with Dr. Vassie Loll in 6 months and as needed

## 2015-11-17 NOTE — Patient Instructions (Addendum)
Zpack take as directed.  Mucinex DM Twice daily  As needed cough/congestion .  Eat yogurt daily while on antibiotic.  Diflucan  x 1 if your develop yeast infection  Wear CPAP each night. Continued to work on Weight loss Exercise as tolerated. Continue on Symbicort and Singulair.  Symbicort copay card given.  Follow up with Dr. Vassie Loll in 6 months and as needed

## 2015-12-20 ENCOUNTER — Ambulatory Visit (INDEPENDENT_AMBULATORY_CARE_PROVIDER_SITE_OTHER): Payer: BLUE CROSS/BLUE SHIELD | Admitting: Family Medicine

## 2015-12-20 ENCOUNTER — Ambulatory Visit (HOSPITAL_BASED_OUTPATIENT_CLINIC_OR_DEPARTMENT_OTHER)
Admission: RE | Admit: 2015-12-20 | Discharge: 2015-12-20 | Disposition: A | Discharge status: Home / Self Care | Payer: BLUE CROSS/BLUE SHIELD | Source: Ambulatory Visit | Attending: Family Medicine | Admitting: Family Medicine

## 2015-12-20 ENCOUNTER — Encounter: Payer: Self-pay | Admitting: Family Medicine

## 2015-12-20 VITALS — BP 132/86 | HR 102 | Temp 98.6°F | Ht 65.0 in | Wt 286.4 lb

## 2015-12-20 DIAGNOSIS — M79671 Pain in right foot: Secondary | ICD-10-CM

## 2015-12-20 MED ORDER — TRAMADOL HCL 50 MG PO TABS: 50.0000 mg | ORAL_TABLET | Freq: Three times a day (TID) | ORAL | Status: DC | PRN

## 2015-12-20 MED ORDER — COLCHICINE 0.6 MG PO TABS: ORAL_TABLET | ORAL | Status: DC

## 2015-12-20 MED ORDER — INDOMETHACIN 50 MG PO CAPS: 50.0000 mg | ORAL_CAPSULE | Freq: Three times a day (TID) | ORAL | Status: DC

## 2015-12-20 MED FILL — PHENTERMINE 37.5 MG TABLET: 37.5 | 30 days supply | Qty: 30 | Fill #1 | Status: TO
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
## 2015-12-20 NOTE — Progress Notes (Signed)
Pre visit review using our clinic review tool, if applicable. No additional management support is needed unless otherwise documented below in the visit note. 

## 2015-12-20 NOTE — Patient Instructions (Signed)
I suspect that you have gout- this is a type of joint inflammation that can be very painful! You will have an x-ray today to make sure there is nothing else wrong Assuming your x-ray looks ok, let's treat you for gout: Colchicine 2 pills, then 1 more an hour later Indomethacin as needed up to three times a day (if taking this do NOT take ibuprofen) Tramadol if needed for more severe pain- this can make you sleepy!  Use the hard soled shoe as needed for support

## 2016-01-23 ENCOUNTER — Telehealth: Payer: Self-pay | Admitting: Pulmonary Disease

## 2016-01-23 MED ORDER — BUDESONIDE-FORMOTEROL FUMARATE 160-4.5 MCG/ACT IN AERO: 2.0000 | INHALATION_SPRAY | Freq: Two times a day (BID) | RESPIRATORY_TRACT | Status: DC

## 2016-01-23 NOTE — Telephone Encounter (Signed)
Called and spoke with pt. She is requesting a refill on her Symbicort 160. I verified pharmacy as [facility] on [address]. I explained to her that I would send the medication in today. She voiced understanding and had no further questions. Rx was sent. Nothing further needed.

## 2016-03-14 ENCOUNTER — Encounter: Payer: BLUE CROSS/BLUE SHIELD | Admitting: Family Medicine

## 2016-03-18 ENCOUNTER — Telehealth: Payer: Self-pay | Admitting: Family Medicine

## 2016-03-18 NOTE — Telephone Encounter (Signed)
No charge but she will the next time

## 2016-03-18 NOTE — Telephone Encounter (Signed)
Pt was no show 03/14/16 for cpe, 1st no show I see, pt has not rescheduled, charge or no charge?

## 2016-03-19 ENCOUNTER — Other Ambulatory Visit: Payer: Self-pay | Admitting: Adult Health

## 2016-06-14 ENCOUNTER — Ambulatory Visit (INDEPENDENT_AMBULATORY_CARE_PROVIDER_SITE_OTHER): Payer: BLUE CROSS/BLUE SHIELD | Admitting: Family Medicine

## 2016-06-14 ENCOUNTER — Encounter: Payer: Self-pay | Admitting: Family Medicine

## 2016-06-14 VITALS — BP 132/84 | HR 94 | Temp 98.1°F | Resp 17 | Ht 65.0 in | Wt 290.5 lb

## 2016-06-14 DIAGNOSIS — Z0000 Encounter for general adult medical examination without abnormal findings: Secondary | ICD-10-CM

## 2016-06-14 DIAGNOSIS — Z1231 Encounter for screening mammogram for malignant neoplasm of breast: Secondary | ICD-10-CM | POA: Diagnosis not present

## 2016-06-14 DIAGNOSIS — Z23 Encounter for immunization: Secondary | ICD-10-CM | POA: Diagnosis not present

## 2016-06-14 LAB — CBC WITH DIFFERENTIAL/PLATELET
Basophils Absolute: 0 10*3/uL (ref 0.0–0.1)
Basophils Relative: 0.7 % (ref 0.0–3.0)
EOS ABS: 0.3 10*3/uL (ref 0.0–0.7)
EOS PCT: 4 % (ref 0.0–5.0)
HCT: 37.9 % (ref 36.0–46.0)
Hemoglobin: 12.7 g/dL (ref 12.0–15.0)
LYMPHS ABS: 2.3 10*3/uL (ref 0.7–4.0)
Lymphocytes Relative: 36 % (ref 12.0–46.0)
MCHC: 33.4 g/dL (ref 30.0–36.0)
MCV: 90.6 fl (ref 78.0–100.0)
MONO ABS: 0.5 10*3/uL (ref 0.1–1.0)
Monocytes Relative: 7.4 % (ref 3.0–12.0)
NEUTROS PCT: 51.9 % (ref 43.0–77.0)
Neutro Abs: 3.3 10*3/uL (ref 1.4–7.7)
Platelets: 280 10*3/uL (ref 150.0–400.0)
RBC: 4.19 Mil/uL (ref 3.87–5.11)
RDW: 15.1 % (ref 11.5–15.5)
WBC: 6.3 10*3/uL (ref 4.0–10.5)

## 2016-06-14 LAB — BASIC METABOLIC PANEL
BUN: 11 mg/dL (ref 6–23)
CHLORIDE: 103 meq/L (ref 96–112)
CO2: 32 meq/L (ref 19–32)
CREATININE: 0.64 mg/dL (ref 0.40–1.20)
Calcium: 8.9 mg/dL (ref 8.4–10.5)
GFR: 125.28 mL/min (ref >60.00)
GLUCOSE: 97 mg/dL (ref 70–99)
POTASSIUM: 4.6 meq/L (ref 3.5–5.1)
Sodium: 140 mEq/L (ref 135–145)

## 2016-06-14 LAB — LIPID PANEL
CHOLESTEROL: 208 mg/dL — AB (ref 0–200)
HDL: 61.6 mg/dL (ref >39.00)
LDL Cholesterol: 133 mg/dL — ABNORMAL HIGH (ref 0–99)
NONHDL: 146.32
Total CHOL/HDL Ratio: 3
Triglycerides: 66 mg/dL (ref 0.0–149.0)
VLDL: 13.2 mg/dL (ref 0.0–40.0)

## 2016-06-14 LAB — HEPATIC FUNCTION PANEL
ALBUMIN: 3.9 g/dL (ref 3.5–5.2)
ALK PHOS: 69 U/L (ref 39–117)
ALT: 13 U/L (ref 0–35)
AST: 18 U/L (ref 0–37)
Bilirubin, Direct: 0.1 mg/dL (ref 0.0–0.3)
Total Bilirubin: 0.4 mg/dL (ref 0.2–1.2)
Total Protein: 7 g/dL (ref 6.0–8.3)

## 2016-06-14 LAB — TSH: TSH: 1.49 u[IU]/mL (ref 0.35–4.50)

## 2016-06-14 LAB — VITAMIN D 25 HYDROXY (VIT D DEFICIENCY, FRACTURES): VITD: 18.18 ng/mL — AB (ref 30.00–100.00)

## 2016-06-14 NOTE — Progress Notes (Signed)
   Subjective:    Patient ID: [name], female    DOB: 11/24/1965, 50 y.o.   MRN: [id number]  HPI CPE- UTD on colonoscopy, pap.  Due for mammo.     Review of Systems Patient reports no vision/ hearing changes, adenopathy,fever, weight change,  persistant/recurrent hoarseness , swallowing issues, chest pain, palpitations, edema, persistant/recurrent cough, hemoptysis, dyspnea (rest/exertional/paroxysmal nocturnal), gastrointestinal bleeding (melena, rectal bleeding), abdominal pain, significant heartburn, bowel changes, GU symptoms (dysuria, hematuria, incontinence), Gyn symptoms (abnormal  bleeding, pain),  syncope, focal weakness, memory loss, numbness & tingling, skin/hair/nail changes, abnormal bruising or bleeding, anxiety, or depression.     Objective:   Physical Exam General Appearance:    Alert, cooperative, no distress, appears stated age, obese  Head:    Normocephalic, without obvious abnormality, atraumatic  Eyes:    PERRL, conjunctiva/corneas clear, EOM's intact, fundi    benign, both eyes  Ears:    Normal TM's and external ear canals, both ears  Nose:   Nares normal, septum midline, mucosa normal, no drainage    or sinus tenderness  Throat:   Lips, mucosa, and tongue normal; teeth and gums normal  Neck:   Supple, symmetrical, trachea midline, no adenopathy;    Thyroid: mild enlargement w/o nodularity  Back:     Symmetric, no curvature, ROM normal, no CVA tenderness  Lungs:     Clear to auscultation bilaterally, respirations unlabored  Chest Wall:    No tenderness or deformity   Heart:    Regular rate and rhythm, S1 and S2 normal, no murmur, rub   or gallop  Breast Exam:    Deferred to GYN  Abdomen:     Soft, non-tender, bowel sounds active all four quadrants,    no masses, no organomegaly  Genitalia:    Deferred to GYN  Rectal:    Extremities:   Extremities normal, atraumatic, no cyanosis or edema  Pulses:   2+ and symmetric all extremities  Skin:   Skin color,  texture, turgor normal, no rashes or lesions  Lymph nodes:   Cervical, supraclavicular, and axillary nodes normal  Neurologic:   CNII-XII intact, normal strength, sensation and reflexes    throughout          Assessment & Plan:

## 2016-06-14 NOTE — Progress Notes (Signed)
Pre visit review using our clinic review tool, if applicable. No additional management support is needed unless otherwise documented below in the visit note. 

## 2016-06-14 NOTE — Patient Instructions (Signed)
Follow up in 6 months to recheck weight loss progress We'll notify you of your lab results and make any changes if needed Continue to work on healthy diet and regular exercise- you can do it!!! We'll call you with your mammogram appt Call with any questions or concerns Happy Labor Day!!!

## 2016-06-14 NOTE — Assessment & Plan Note (Signed)
Pt's PE WNL w/ exception of morbid obesity and mild thyromegaly.  UTD on pap, colonoscopy.  Due for mammo- order entered.  Check labs.  Stressed need for healthy diet and regular exercise.  Anticipatory guidance provided.

## 2016-06-18 ENCOUNTER — Other Ambulatory Visit: Payer: Self-pay | Admitting: General Practice

## 2016-06-18 MED ORDER — VITAMIN D (ERGOCALCIFEROL) 1.25 MG (50000 UNIT) PO CAPS: 50000.0000 [IU] | ORAL_CAPSULE | ORAL | 0 refills | Status: DC

## 2016-07-08 ENCOUNTER — Emergency Department (HOSPITAL_COMMUNITY): Payer: BLUE CROSS/BLUE SHIELD

## 2016-07-08 ENCOUNTER — Encounter (HOSPITAL_COMMUNITY): Payer: Self-pay

## 2016-07-08 ENCOUNTER — Emergency Department (HOSPITAL_COMMUNITY)
Admission: EM | Admit: 2016-07-08 | Discharge: 2016-07-08 | Disposition: A | Discharge status: Home / Self Care | Payer: BLUE CROSS/BLUE SHIELD | Attending: Emergency Medicine | Admitting: Emergency Medicine

## 2016-07-08 DIAGNOSIS — Y929 Unspecified place or not applicable: Secondary | ICD-10-CM | POA: Diagnosis not present

## 2016-07-08 DIAGNOSIS — M5442 Lumbago with sciatica, left side: Secondary | ICD-10-CM | POA: Insufficient documentation

## 2016-07-08 DIAGNOSIS — Y990 Civilian activity done for income or pay: Secondary | ICD-10-CM | POA: Insufficient documentation

## 2016-07-08 DIAGNOSIS — X500XXA Overexertion from strenuous movement or load, initial encounter: Secondary | ICD-10-CM | POA: Insufficient documentation

## 2016-07-08 DIAGNOSIS — J45909 Unspecified asthma, uncomplicated: Secondary | ICD-10-CM | POA: Diagnosis not present

## 2016-07-08 DIAGNOSIS — R52 Pain, unspecified: Secondary | ICD-10-CM

## 2016-07-08 DIAGNOSIS — Y9389 Activity, other specified: Secondary | ICD-10-CM | POA: Insufficient documentation

## 2016-07-08 DIAGNOSIS — M5431 Sciatica, right side: Secondary | ICD-10-CM

## 2016-07-08 DIAGNOSIS — M545 Low back pain: Secondary | ICD-10-CM | POA: Diagnosis present

## 2016-07-08 MED ORDER — KETOROLAC TROMETHAMINE 60 MG/2ML IM SOLN
60.0000 mg | Freq: Once | INTRAMUSCULAR | Status: AC
  Administered 2016-07-08: 60 mg via INTRAMUSCULAR
  Filled 2016-07-08: qty 2

## 2016-07-08 MED ORDER — NAPROXEN 500 MG PO TABS: 500.0000 mg | ORAL_TABLET | Freq: Two times a day (BID) | ORAL | 0 refills | Status: DC

## 2016-07-08 MED ORDER — PREDNISONE 20 MG PO TABS: ORAL_TABLET | ORAL | 0 refills | Status: DC

## 2016-07-08 MED ORDER — CYCLOBENZAPRINE HCL 10 MG PO TABS: 10.0000 mg | ORAL_TABLET | Freq: Two times a day (BID) | ORAL | 0 refills | Status: DC | PRN

## 2016-07-08 NOTE — ED Provider Notes (Signed)
[facility]-EMERGENCY DEPT Provider Note   CSN: [id number] Arrival date & time: 07/08/16  1753   By signing my name below, I, Clovis Pu, attest that this documentation has been prepared under the direction and in the presence of  Felicie Morn, NP. Electronically Signed: Clovis Pu, ED Scribe. 07/08/16. 8:36 PM.   History   Chief Complaint Chief Complaint  Patient presents with  . Back Pain    The history is provided by the patient. No language interpreter was used.  Back Pain   This is a chronic problem. The current episode started more than 2 days ago. The problem occurs constantly. The problem has not changed since onset.The pain is associated with lifting heavy objects. Radiates to: right hip  The pain is moderate. Pertinent negatives include no numbness.   HPI Comments:  [name] is a 50 y.o. female who presents to the Emergency Department complaining of diffuse lower back pain onset 1 week ago. Pt states the pain radiates to her right hip. She states she does lifting at work which has exacerbated the pain. Pt notes her back "has not been right" since her injury in 2013. She denies numbness to her BLE, bowel or bladder incontinence. She also denies HTN, kidney disease, or DM. No alleviating factors noted. Pt denies any other complaints at this time.   Past Medical History:  Diagnosis Date  . Allergy   . Asthma   . Bronchitis   . GERD (gastroesophageal reflux disease)   . Sleep apnea    uses c pap     Patient Active Problem List   Diagnosis Date Noted  . Allergic rhinitis 11/13/2015  . Contusion 01/16/2015  . 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 Porokeratosis 12/16/2014  . Foreign body granuloma of skin and subcutaneous tissue 12/16/2014  . Pain of left heel 12/05/2014  . Severe obesity (BMI >= 40) (HCC) 12/15/2013  . Other dysphagia 12/15/2013  . Tinea pedis 10/08/2013  . Dysuria 10/08/2013  . Sinobronchitis 02/18/2013  . Asthma with acute exacerbation 02/18/2013  . Edema  07/22/2012  . Visual changes 07/22/2012  . Cellulitis and abscess of buttock 12/27/2011  . Conjunctivitis 12/16/2011  . Sinusitis 12/16/2011  . Seasonal allergic rhinitis 12/16/2011  . Asthma 11/13/2011  . Bronchitis 11/13/2011  . General medical examination 07/09/2011  . Exposure to hepatitis B 07/09/2011  . OSA (obstructive sleep apnea) 07/09/2011  . Back pain 07/09/2011    Past Surgical History:  Procedure Laterality Date  . DENTAL SURGERY    . ECTOPIC PREGNANCY SURGERY  2006    OB History    No data available       Home Medications    Prior to Admission medications   Medication Sig Start Date End Date Taking? Authorizing Provider  albuterol (PROVENTIL HFA;VENTOLIN HFA) 108 (90 BASE) MCG/ACT inhaler Inhale 2 puffs into the lungs every 4 (four) hours as needed for wheezing or shortness of breath. 05/24/15   Sheliah Hatch, MD  albuterol (PROVENTIL) (2.5 MG/3ML) 0.083% nebulizer solution Take 3 mLs (2.5 mg total) by nebulization every 6 (six) hours as needed for wheezing. 05/24/15 11/30/19  Sheliah Hatch, MD  budesonide-formoterol (SYMBICORT) 160-4.5 MCG/ACT inhaler Inhale 2 puffs into the lungs 2 (two) times daily. 01/23/16   Oretha Milch, MD  cetirizine (ZYRTEC) 10 MG tablet Take 1 tablet (10 mg total) by mouth daily. Patient not taking: Reported on 06/14/2016 11/13/15   Sheliah Hatch, MD  ibuprofen (ADVIL,MOTRIN) 800 MG tablet prn 09/26/15   Historical  Provider, MD  montelukast (SINGULAIR) 10 MG tablet TAKE 1 TABLET (10 MG TOTAL) BY MOUTH AT BEDTIME. 03/20/16   Julio Sicksammy S Parrett, NP  Nebulizers (COMPRESSOR/NEBULIZER) MISC As directed 09/20/11   Lelon PerlaYvonne R Lowne Chase, DO  phentermine 37.5 MG capsule Take 1 capsule (37.5 mg total) by mouth every morning. Patient not taking: Reported on 06/14/2016 11/13/15   Sheliah HatchKatherine E Tabori, MD  Vitamin D, Ergocalciferol, (DRISDOL) 50000 units CAPS capsule Take 1 capsule (50,000 Units total) by mouth every 7 (seven) days. 06/18/16   Sheliah HatchKatherine  E Tabori, MD    Family History Family History  Problem Relation Age of Onset  . Hypertension Father   . Hypertension Sister   . Stroke Paternal Grandfather   . Hypertension Paternal Grandfather   . Hypertension Sister   . Colon cancer Neg Hx   . Colon polyps Neg Hx   . Esophageal cancer Neg Hx   . Rectal cancer Neg Hx   . Stomach cancer Neg Hx     Social History Social History  Substance Use Topics  . Smoking status: Never Smoker  . Smokeless tobacco: Never Used  . Alcohol use 0.0 oz/week     Comment: rare     Allergies   Review of patient's allergies indicates no known allergies.   Review of Systems Review of Systems  Musculoskeletal: Positive for arthralgias and back pain.  Neurological: Negative for numbness.  All other systems reviewed and are negative.    Physical Exam Updated Vital Signs BP 145/72 (BP Location: Left Arm)   Pulse 103   Temp 99.4 F (37.4 C) (Oral)   Resp 16   Ht 5\' 5"  (1.651 m)   Wt 280 lb (127 kg)   SpO2 100%   BMI 46.59 kg/m   Physical Exam  Constitutional: She is oriented to person, place, and time. She appears well-developed and well-nourished. No distress.  HENT:  Head: Normocephalic and atraumatic.  Eyes: Conjunctivae are normal.  Cardiovascular: Normal rate.   Pulmonary/Chest: Effort normal.  Abdominal: She exhibits no distension.  Musculoskeletal:  Bilateral low back pain with pain radiating to the left hip. Pt is ambulatory.  No neurological deficits. No red flag symptoms.   Neurological: She is alert and oriented to person, place, and time.  Skin: Skin is warm and dry.  Psychiatric: She has a normal mood and affect.  Nursing note and vitals reviewed.    ED Treatments / Results  DIAGNOSTIC STUDIES:  Oxygen Saturation is 100% on RA, normal by my interpretation.    COORDINATION OF CARE:  8:32 PM Discussed treatment plan with pt at bedside and pt agreed to plan.  Labs (all labs ordered are listed, but only  abnormal results are displayed) Labs Reviewed - No data to display  EKG  EKG Interpretation None       Radiology Dg Ankle Complete Left  Result Date: 07/08/2016 CLINICAL DATA:  Pain EXAM: LEFT ANKLE COMPLETE - 3+ VIEW COMPARISON:  None. FINDINGS: There is no evidence of fracture, dislocation, or joint effusion. There is plantar calcaneal spur. Calcification at the Achilles tendon insertion to the calcaneus is noted. There is soft tissue swelling medially. IMPRESSION: No acute fracture or dislocation. Medial ankle soft tissue swelling. Electronically Signed   By: Sherian ReinWei-Chen  Lin M.D.   On: 07/08/2016 19:32   Dg Foot Complete Left  Result Date: 07/08/2016 CLINICAL DATA:  Pain EXAM: LEFT FOOT - COMPLETE 3+ VIEW COMPARISON:  December 05, 2014 FINDINGS: There is no evidence of fracture  or dislocation. There is plantar calcaneal spur. Calcification at Achilles tendon insertion the calcaneus is noted. Soft tissues are unremarkable. IMPRESSION: No acute fracture or dislocation. Electronically Signed   By: Sherian Rein M.D.   On: 07/08/2016 19:33    Procedures Procedures (including critical care time)  Medications Ordered in ED Medications - No data to display   Initial Impression / Assessment and Plan / ED Course  I have reviewed the triage vital signs and the nursing notes.  Pertinent labs & imaging results that were available during my care of the patient were reviewed by me and considered in my medical decision making (see chart for details).  Clinical Course  Patient with back pain.  No neurological deficits and normal neuro exam.  Patient is ambulatory.  No loss of bowel or bladder control.  No concern for cauda equina.  No fever, night sweats, weight loss, h/o cancer, IVDA, no recent procedure to back. No urinary symptoms suggestive of UTI.  Supportive care and return precaution discussed. Appears safe for discharge at this time. Follow up as indicated in discharge paperwork.      Final Clinical Impressions(s) / ED Diagnoses   Final diagnoses:  Bilateral low back pain with left-sided sciatica    New Prescriptions Discharge Medication List as of 07/08/2016  9:16 PM    START taking these medications   Details  cyclobenzaprine (FLEXERIL) 10 MG tablet Take 1 tablet (10 mg total) by mouth 2 (two) times daily as needed for muscle spasms., Starting Mon 07/08/2016, Print    naproxen (NAPROSYN) 500 MG tablet Take 1 tablet (500 mg total) by mouth 2 (two) times daily., Starting Mon 07/08/2016, Print    predniSONE (DELTASONE) 20 MG tablet 3 tabs po day one, then 2 tabs daily x 4 days, Print      I personally performed the services described in this documentation, which was scribed in my presence. The recorded information has been reviewed and is accurate.     Felicie Morn, NP 07/09/16 0159    Loren Racer, MD 07/10/16 [phone]
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
## 2016-07-09 ENCOUNTER — Other Ambulatory Visit: Payer: Self-pay | Admitting: Family Medicine

## 2016-07-09 DIAGNOSIS — Z1231 Encounter for screening mammogram for malignant neoplasm of breast: Secondary | ICD-10-CM

## 2016-07-15 ENCOUNTER — Ambulatory Visit (INDEPENDENT_AMBULATORY_CARE_PROVIDER_SITE_OTHER): Payer: BLUE CROSS/BLUE SHIELD | Admitting: Pulmonary Disease

## 2016-07-15 ENCOUNTER — Encounter: Payer: Self-pay | Admitting: Pulmonary Disease

## 2016-07-15 VITALS — BP 122/84 | HR 86 | Ht 65.0 in | Wt 294.8 lb

## 2016-07-15 DIAGNOSIS — G4733 Obstructive sleep apnea (adult) (pediatric): Secondary | ICD-10-CM | POA: Diagnosis not present

## 2016-07-15 DIAGNOSIS — J4531 Mild persistent asthma with (acute) exacerbation: Secondary | ICD-10-CM | POA: Diagnosis not present

## 2016-07-15 NOTE — Assessment & Plan Note (Signed)
CPAP is working well Drop settings to auto CPAP 5-12 cm  Weight loss encouraged, compliance with goal of at least 4-6 hrs every night is the expectation. Advised against medications with sedative side effects Cautioned against driving when sleepy - understanding that sleepiness will vary on a day to day basis

## 2016-07-15 NOTE — Assessment & Plan Note (Addendum)
Stay on Symbicort twice daily - this is  your maintenance medication Refill on pro-air-  If stays well for 6 months then she can consider stepdown therapy

## 2016-07-15 NOTE — Progress Notes (Signed)
   Subjective:    Patient ID: [name], female    DOB: 06/29/1966, 50 y.o.   MRN: [id number]  HPI  50 -year-old never smoker for FU of asthma and mod obstructive sleep apnea.   Asthma was diagnosed in 2006. Her triggers include uri and weather changes.She works for [employer] and runs a [employer]. She has not had formal allergy testing.  After an exacerbation in December 2012, she was placed on home oxygen transiently   07/15/2016   Chief Complaint  Patient presents with  . Follow-up    doing well on cpap. no concerns.   She has seasonal allergies and her sinuses act up in fall. She takes over-the-counter Zyrtec .She reports intermittent heartburn controlled by over-the-counter medications She was put on prednisone after a hip injection. She feels breathing is good and has stopped using Symbicort. She uses Symbicort as needed. She has not needed pro-air in a while and may have run out of this. She was given co-pay cards last visit and has been able to get her medication with this  She is compliant with her CPAP machine Download shows good control of events with average pressure of 11 cm on auto settings, without leak She uses nasal pillows and gets aggravated sometimes-but is trying her best   Significant tests/ events  01/29/2012 Spirometry showed no airway obstruction, FEV1 was 82% FVC was 79% ratio 79  PSG 2013 moderate obstructive sleep apnea - AHI 18/h    Review of Systems  neg for any significant sore throat, dysphagia, itching, sneezing, nasal congestion or excess/ purulent secretions, fever, chills, sweats, unintended wt loss, pleuritic or exertional cp, hempoptysis, orthopnea pnd or change in chronic leg swelling.  Also denies presyncope, palpitations, heartburn, abdominal pain, nausea, vomiting, diarrhea or change in bowel or urinary habits, dysuria,hematuria, rash, arthralgias, visual complaints, headache, numbness weakness or ataxia.     Objective:     Physical Exam   Gen. Pleasant, obese, in no distress ENT - no lesions, no post nasal drip Neck: No JVD, no thyromegaly, no carotid bruits Lungs: no use of accessory muscles, no dullness to percussion, decreased without rales or rhonchi  Cardiovascular: Rhythm regular, heart sounds  normal, no murmurs or gallops, no peripheral edema Musculoskeletal: No deformities, no cyanosis or clubbing , no tremors        Assessment & Plan:

## 2016-07-15 NOTE — Patient Instructions (Signed)
Stay on Symbicort twice daily - this is  your maintenance medication Refill on pro-air-  CPAP is working well Drop settings to auto CPAP 5-12 cm

## 2016-07-19 ENCOUNTER — Ambulatory Visit
Admission: RE | Admit: 2016-07-19 | Discharge: 2016-07-19 | Disposition: A | Discharge status: Home / Self Care | Payer: BLUE CROSS/BLUE SHIELD | Source: Ambulatory Visit | Attending: Family Medicine | Admitting: Family Medicine

## 2016-07-19 DIAGNOSIS — Z1231 Encounter for screening mammogram for malignant neoplasm of breast: Secondary | ICD-10-CM

## 2016-07-22 ENCOUNTER — Other Ambulatory Visit: Payer: Self-pay | Admitting: Family Medicine

## 2016-07-22 MED ORDER — PHENTERMINE HCL 37.5 MG PO CAPS: 37.5000 mg | ORAL_CAPSULE | ORAL | 3 refills | Status: DC

## 2016-07-22 NOTE — Telephone Encounter (Signed)
Pt asking for a refill on phentermine, [facility] on [address]

## 2016-07-22 NOTE — Telephone Encounter (Signed)
Medication filled to pharmacy as requested.   

## 2016-07-22 NOTE — Telephone Encounter (Signed)
Last OV 06/14/16 (CPE) Phentermine last filled 11/13/15 #30 with 3

## 2016-07-24 ENCOUNTER — Encounter: Payer: Self-pay | Admitting: Pulmonary Disease

## 2016-08-11 ENCOUNTER — Other Ambulatory Visit: Payer: Self-pay | Admitting: Pulmonary Disease

## 2016-08-13 ENCOUNTER — Other Ambulatory Visit: Payer: Self-pay | Admitting: Pulmonary Disease

## 2016-09-04 ENCOUNTER — Other Ambulatory Visit: Payer: Self-pay | Admitting: Family Medicine

## 2016-09-17 ENCOUNTER — Other Ambulatory Visit: Payer: Self-pay | Admitting: Family Medicine

## 2016-11-25 ENCOUNTER — Encounter: Payer: Self-pay | Admitting: Family Medicine

## 2016-11-25 ENCOUNTER — Ambulatory Visit: Payer: Self-pay | Admitting: Family Medicine

## 2016-11-25 ENCOUNTER — Ambulatory Visit (INDEPENDENT_AMBULATORY_CARE_PROVIDER_SITE_OTHER): Payer: BLUE CROSS/BLUE SHIELD | Admitting: Family Medicine

## 2016-11-25 VITALS — BP 122/80 | HR 91 | Temp 99.1°F | Resp 16 | Ht 65.0 in | Wt 295.4 lb

## 2016-11-25 DIAGNOSIS — R350 Frequency of micturition: Secondary | ICD-10-CM

## 2016-11-25 DIAGNOSIS — G5603 Carpal tunnel syndrome, bilateral upper limbs: Secondary | ICD-10-CM | POA: Diagnosis not present

## 2016-11-25 DIAGNOSIS — R319 Hematuria, unspecified: Secondary | ICD-10-CM

## 2016-11-25 LAB — POCT URINALYSIS DIPSTICK
BILIRUBIN UA: NEGATIVE
GLUCOSE UA: NEGATIVE
KETONES UA: NEGATIVE
LEUKOCYTES UA: NEGATIVE
NITRITE UA: NEGATIVE
Protein, UA: NEGATIVE
Spec Grav, UA: 1.025
Urobilinogen, UA: 0.2
pH, UA: 6

## 2016-11-25 MED ORDER — MELOXICAM 15 MG PO TABS: 15.0000 mg | ORAL_TABLET | Freq: Every day | ORAL | 1 refills | Status: DC

## 2016-11-25 NOTE — Progress Notes (Signed)
Pre visit review using our clinic review tool, if applicable. No additional management support is needed unless otherwise documented below in the visit note. 

## 2016-11-25 NOTE — Progress Notes (Signed)
   Subjective:    Patient ID: [name], female    DOB: 10/06/1966, 51 y.o.   MRN: [id number]  HPI Hand numbness- sxs started 3 weeks ago.  L is worse than R.  Pt will wake up w/ hand numbness, occurs w/ activity throughout the day.  Having some burning w/ the tingling.  sxs will improve when she 'shakes it out'.  Works w/ [employer] all day- does a lot of heavy lifting.  Urinary frequency- pt reports increased urination for 'years'.  Will sometimes need to go every 15 minutes.  Now wearing liners due to leakage.  No burning w/ urination.  'i just can't hold it'.   Review of Systems For ROS see HPI     Objective:   Physical Exam  Constitutional: She is oriented to person, place, and time. She appears well-developed and well-nourished. No distress.  obese  HENT:  Head: Normocephalic and atraumatic.  Cardiovascular: Intact distal pulses.   Abdominal: Soft. Bowel sounds are normal. She exhibits no distension. There is no tenderness. There is no rebound and no guarding.  Neurological: She is alert and oriented to person, place, and time. She has normal reflexes.  + phalen's bilaterally  Skin: Skin is warm and dry.  Psychiatric: She has a normal mood and affect. Her behavior is normal. Thought content normal.  Vitals reviewed.         Assessment & Plan:  Carpal tunnel- new.  Pt's sxs and PE consistent w/ inflammation.  Given her extensive work w/ her hands, will start w/ nightly bracing bilaterally and daily NSAID to improve inflammation.  If no improvement, will need referral to hand specialist.  Pt expressed understanding and is in agreement w/ plan.   Urinary frequency/urge incontinence- new to provider, ongoing for pt.  She suspects this has been ongoing for 'years'.  She is now wearing pads and frustrated w/ the situation.  Pt is interested in urology referral prior to starting medication.  Referral placed. Will follow.

## 2016-11-25 NOTE — Patient Instructions (Signed)
Follow up as needed/scheduled We'll notify you of your urine culture and make any changes if needed We'll call you with your urology appt for the frequent urination Wear the wrist splints at night Start the Meloxicam once daily for inflammation (don't take any other anti-inflammatories (like Aleve, ibuprofen, motrin, etc) If no improvement in 10-14 days in the hand numbness- let me know so we can send you to ortho Call with any questions or concerns Hang in there!!!

## 2016-11-26 LAB — URINE CULTURE: ORGANISM ID, BACTERIA: NO GROWTH

## 2016-12-12 ENCOUNTER — Ambulatory Visit: Payer: Self-pay | Admitting: Family Medicine

## 2017-01-17 ENCOUNTER — Other Ambulatory Visit: Payer: Self-pay | Admitting: Adult Health

## 2017-01-20 ENCOUNTER — Other Ambulatory Visit: Payer: Self-pay | Admitting: Pulmonary Disease

## 2017-01-20 ENCOUNTER — Other Ambulatory Visit: Payer: Self-pay | Admitting: Family Medicine

## 2017-01-21 ENCOUNTER — Other Ambulatory Visit: Payer: Self-pay | Admitting: General Practice

## 2017-01-21 MED ORDER — PHENTERMINE HCL 37.5 MG PO TABS: 37.5000 mg | ORAL_TABLET | Freq: Every day | ORAL | 2 refills | Status: DC

## 2017-01-21 NOTE — Telephone Encounter (Signed)
Last OV 11/25/16 06/14/16 CPE Phentermine last filled 07/22/16 #30 with 3

## 2017-02-17 ENCOUNTER — Other Ambulatory Visit: Payer: Self-pay | Admitting: Family Medicine

## 2017-02-25 IMAGING — CR DG ANKLE COMPLETE 3+V*L*
3 series · 3 of 3 positions shown · non-contrast
Comparison: None.

CLINICAL DATA: Pain

EXAM:
LEFT ANKLE COMPLETE - 3+ VIEW

[ankle ap]
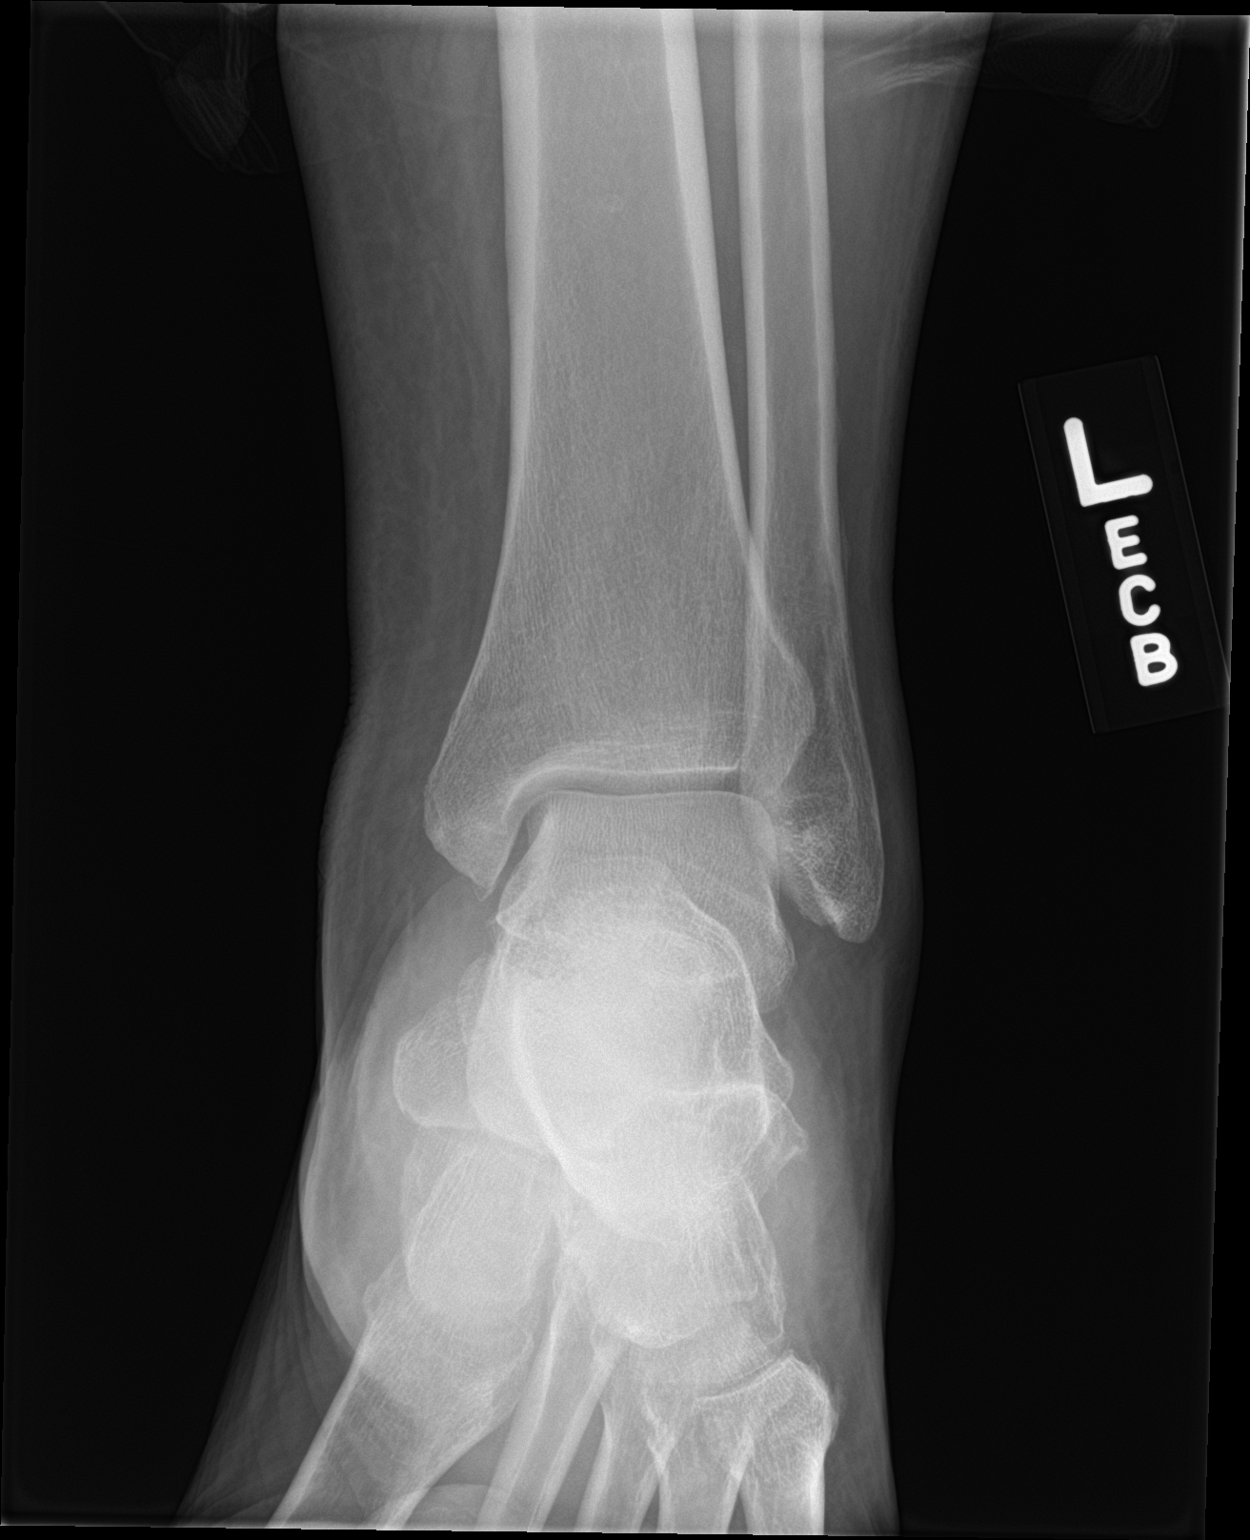

[ankle obl]
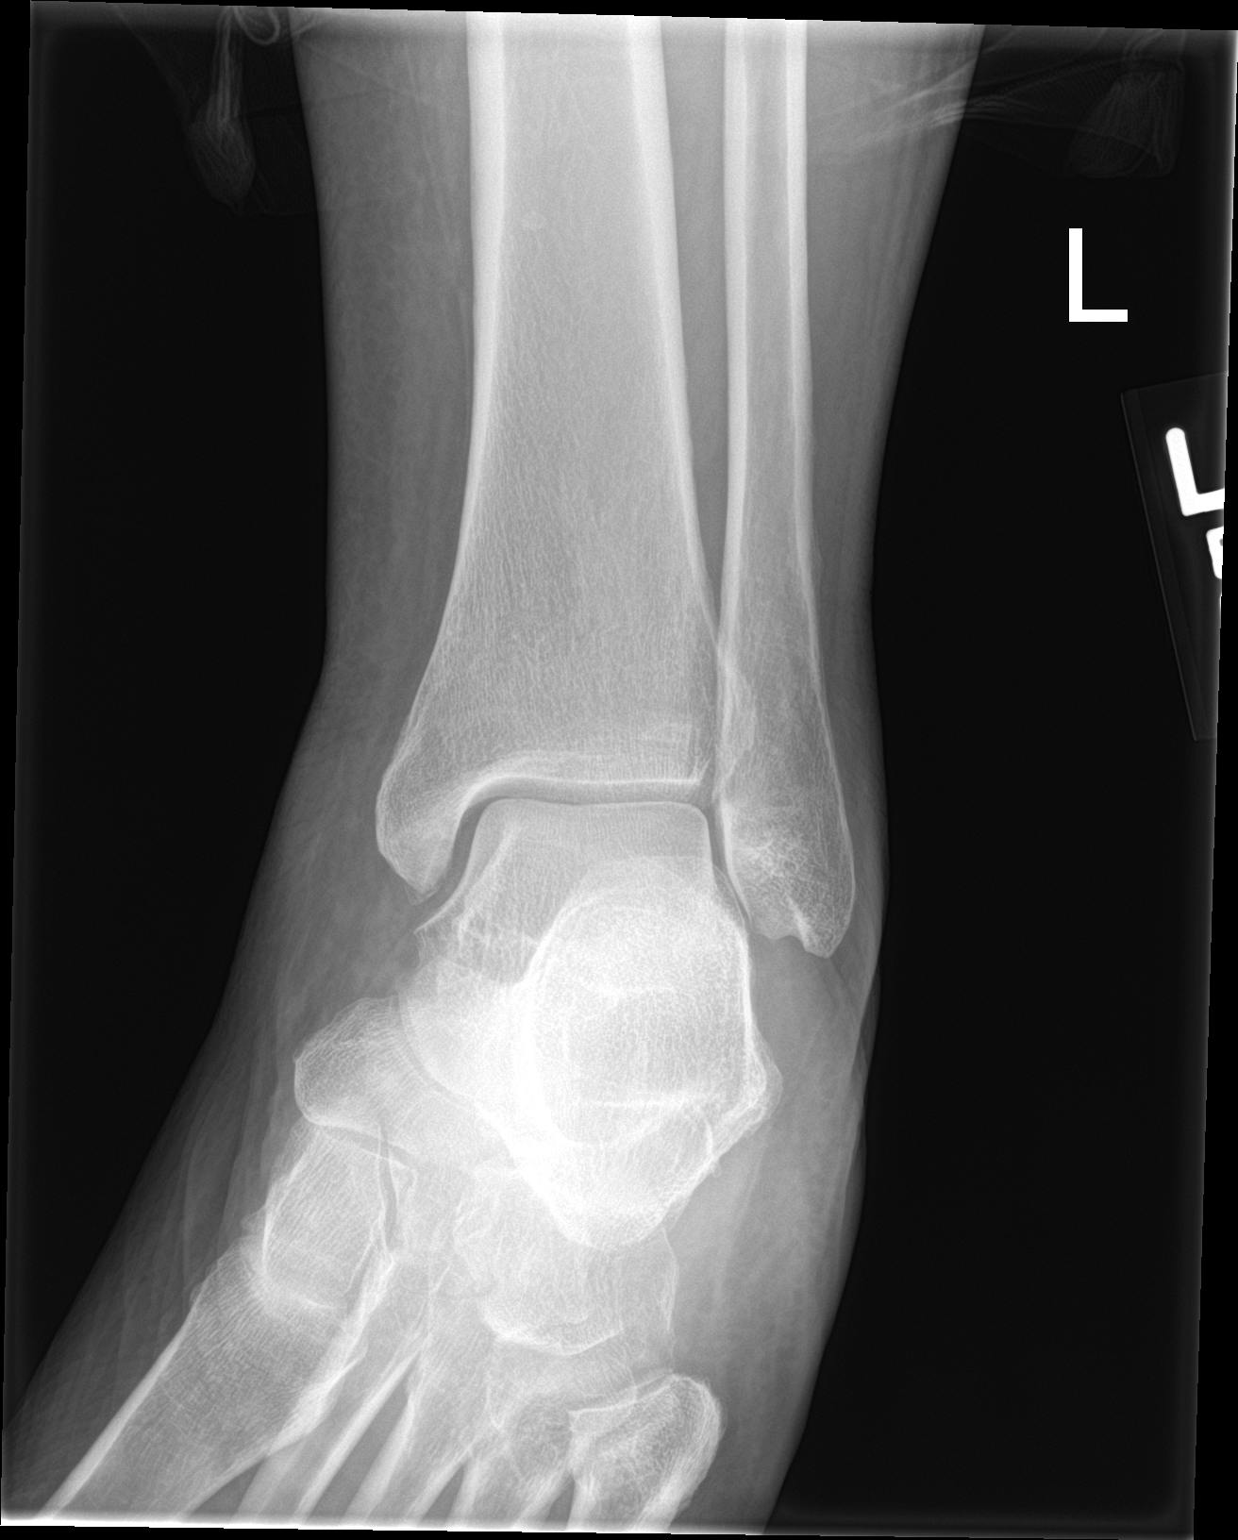

[ankle lat]
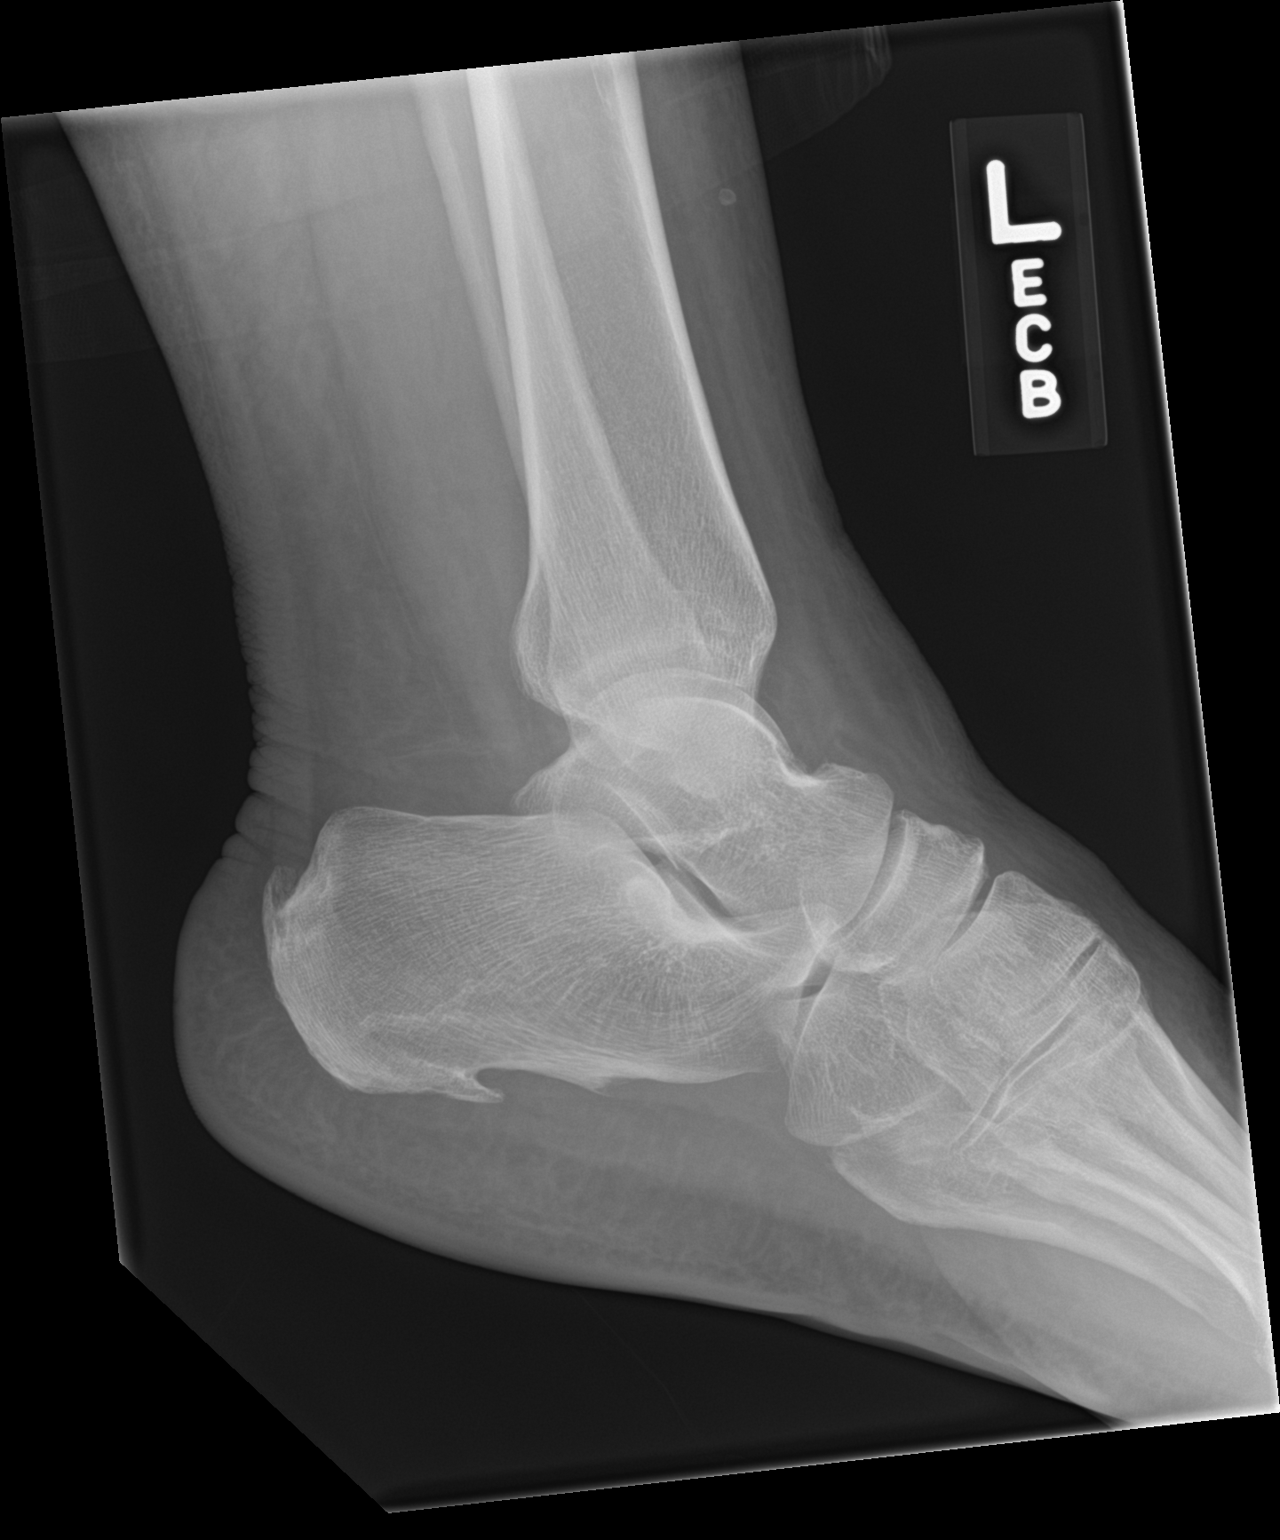

[3 of 3 positions shown; findings below may reference images not displayed]

FINDINGS: There is no evidence of fracture, dislocation, or joint effusion.
There is plantar calcaneal spur. Calcification at the Achilles
tendon insertion to the calcaneus is noted. There is soft tissue
swelling medially.
IMPRESSION: No acute fracture or dislocation. Medial ankle soft tissue swelling.

## 2017-03-14 ENCOUNTER — Encounter: Payer: Self-pay | Admitting: Family Medicine

## 2017-03-14 ENCOUNTER — Ambulatory Visit (INDEPENDENT_AMBULATORY_CARE_PROVIDER_SITE_OTHER): Payer: BLUE CROSS/BLUE SHIELD | Admitting: Family Medicine

## 2017-03-14 VITALS — BP 136/87 | HR 89 | Temp 98.1°F | Resp 16 | Ht 65.0 in | Wt 279.2 lb

## 2017-03-14 DIAGNOSIS — G5603 Carpal tunnel syndrome, bilateral upper limbs: Secondary | ICD-10-CM | POA: Diagnosis not present

## 2017-03-14 DIAGNOSIS — M5441 Lumbago with sciatica, right side: Secondary | ICD-10-CM | POA: Diagnosis not present

## 2017-03-14 DIAGNOSIS — G8929 Other chronic pain: Secondary | ICD-10-CM
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
## 2017-03-14 NOTE — Assessment & Plan Note (Signed)
Ongoing issue.  Pt has lost 20 lbs since starting Phentermine.  Denies palpitations, CP, elevated BP, insomnia.  Stressed need for healthy diet and regular exercise.  Will continue to follow.

## 2017-03-14 NOTE — Progress Notes (Signed)
Pre visit review using our clinic review tool, if applicable. No additional management support is needed unless otherwise documented below in the visit note. 

## 2017-03-14 NOTE — Patient Instructions (Addendum)
Schedule your complete physical for September Continue the Phentermine as directed Make sure you are making healthy food choices and getting regular exercise- this is important!!! We'll call you with your orthopedic appt for the arm pain and weakness and the back pain Call with any questions or concerns Have a great weekend!

## 2017-03-14 NOTE — Assessment & Plan Note (Signed)
Deteriorated.  R arm is now having weakness and worsening pain.  Refer to ortho for evaluation and tx.
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
## 2017-03-28 ENCOUNTER — Other Ambulatory Visit (INDEPENDENT_AMBULATORY_CARE_PROVIDER_SITE_OTHER): Payer: Self-pay | Admitting: Orthopedic Surgery

## 2017-04-25 ENCOUNTER — Ambulatory Visit: Payer: Self-pay | Admitting: Family Medicine

## 2017-04-25 ENCOUNTER — Telehealth: Payer: Self-pay | Admitting: Family Medicine

## 2017-04-25 NOTE — Telephone Encounter (Signed)
Patient has called back and said that the reason she was coming in was hoping that with all the back issues going on that maybe Dr. Beverely Lowabori could do something to allow her to take leave from work.   Patient states that specialist has already put her on restrictions, but it is not working and she feels like she needs time off.   I informed patient that any FMLA would have to be completed by the specialist since they are the ones treating her and her condition.  She stated verbal understanding and said that we she can call their office and that she did not need to see Beverely Lowabori today since she cannot do the paperwork.

## 2017-04-25 NOTE — Telephone Encounter (Signed)
Patient states she is returning call to Dr. Rennis Goldenabori's CMA who called her this morning.  Please call her back.

## 2017-05-07 ENCOUNTER — Other Ambulatory Visit: Payer: Self-pay | Admitting: Family Medicine

## 2017-05-07 NOTE — Telephone Encounter (Signed)
Last OV 03/14/17 (side pain) Phentermine last filled 01/21/17 #30 with 2

## 2017-06-03 ENCOUNTER — Ambulatory Visit (INDEPENDENT_AMBULATORY_CARE_PROVIDER_SITE_OTHER): Payer: BLUE CROSS/BLUE SHIELD | Admitting: Orthopedic Surgery

## 2017-06-04 ENCOUNTER — Ambulatory Visit (INDEPENDENT_AMBULATORY_CARE_PROVIDER_SITE_OTHER): Payer: Worker's Compensation | Admitting: Family

## 2017-06-04 ENCOUNTER — Encounter (INDEPENDENT_AMBULATORY_CARE_PROVIDER_SITE_OTHER): Payer: Self-pay | Admitting: Family

## 2017-06-04 VITALS — Ht 65.0 in | Wt 279.0 lb

## 2017-06-04 DIAGNOSIS — M51369 Other intervertebral disc degeneration, lumbar region without mention of lumbar back pain or lower extremity pain: Secondary | ICD-10-CM

## 2017-06-04 DIAGNOSIS — M25572 Pain in left ankle and joints of left foot: Secondary | ICD-10-CM

## 2017-06-04 DIAGNOSIS — M5441 Lumbago with sciatica, right side: Secondary | ICD-10-CM | POA: Diagnosis not present

## 2017-06-04 DIAGNOSIS — M5136 Other intervertebral disc degeneration, lumbar region: Secondary | ICD-10-CM | POA: Diagnosis not present

## 2017-06-04 DIAGNOSIS — M79672 Pain in left foot: Secondary | ICD-10-CM | POA: Diagnosis not present

## 2017-06-04 DIAGNOSIS — M722 Plantar fascial fibromatosis: Secondary | ICD-10-CM | POA: Diagnosis not present

## 2017-06-04 DIAGNOSIS — G8929 Other chronic pain: Secondary | ICD-10-CM | POA: Diagnosis not present

## 2017-06-04 HISTORY — DX: Other intervertebral disc degeneration, lumbar region without mention of lumbar back pain or lower extremity pain: M51.369
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
## 2017-06-06 NOTE — Addendum Note (Signed)
Addended by: Barnie Del R on: 06/06/2017 01:36 PM   Modules accepted: Orders

## 2017-06-09 ENCOUNTER — Ambulatory Visit (INDEPENDENT_AMBULATORY_CARE_PROVIDER_SITE_OTHER): Payer: BLUE CROSS/BLUE SHIELD | Admitting: Orthopedic Surgery

## 2017-06-10 ENCOUNTER — Telehealth (INDEPENDENT_AMBULATORY_CARE_PROVIDER_SITE_OTHER): Payer: Self-pay

## 2017-06-10 NOTE — Telephone Encounter (Signed)
Patient would like a call back.  Cb# is [phone].  Please advise.  Thank You.

## 2017-06-11 NOTE — Telephone Encounter (Signed)
I tried calling patient this morning at the number provided. There was no answer. I left a voicemail letting her know we were trying to return her call no idea in regards to what.

## 2017-06-12 ENCOUNTER — Telehealth (INDEPENDENT_AMBULATORY_CARE_PROVIDER_SITE_OTHER): Payer: Self-pay | Admitting: Radiology

## 2017-06-12 ENCOUNTER — Other Ambulatory Visit: Payer: Self-pay | Admitting: Family Medicine

## 2017-06-12 DIAGNOSIS — Z1239 Encounter for other screening for malignant neoplasm of breast: Secondary | ICD-10-CM

## 2017-06-12 NOTE — Telephone Encounter (Signed)
Patient called and [name] asking [facility]?  She says that Dr Shon BatonBrooks already scheduled her for some PT, first appt Monday. Please call her to discuss.

## 2017-06-12 NOTE — Telephone Encounter (Signed)
I called and spoke with patient she is wanting referral back to [facility]. Will fax referral today or tomorrow.

## 2017-06-13 NOTE — Telephone Encounter (Signed)
Left message for pt to call me back to discuss. 

## 2017-06-13 NOTE — Telephone Encounter (Signed)
Spoke with pt and she is wanting to go back to [facility] for treatment. She is going to contact her atty about this. I advised her I have already faxed the injection order and Erin's office note so they should have everything they need and to let me know if they don't.

## 2017-06-27 ENCOUNTER — Other Ambulatory Visit: Payer: Self-pay | Admitting: Family Medicine

## 2017-06-30 ENCOUNTER — Encounter: Payer: Self-pay | Admitting: Family Medicine

## 2017-06-30 ENCOUNTER — Ambulatory Visit (INDEPENDENT_AMBULATORY_CARE_PROVIDER_SITE_OTHER): Payer: BLUE CROSS/BLUE SHIELD | Admitting: Family Medicine

## 2017-06-30 VITALS — BP 139/92 | HR 100 | Temp 98.9°F | Resp 17 | Ht 65.0 in | Wt 290.0 lb

## 2017-06-30 DIAGNOSIS — Z23 Encounter for immunization: Secondary | ICD-10-CM

## 2017-06-30 DIAGNOSIS — E559 Vitamin D deficiency, unspecified: Secondary | ICD-10-CM

## 2017-06-30 DIAGNOSIS — Z0000 Encounter for general adult medical examination without abnormal findings: Secondary | ICD-10-CM | POA: Diagnosis not present

## 2017-06-30 HISTORY — DX: Vitamin D deficiency, unspecified: E55.9

## 2017-06-30 LAB — BASIC METABOLIC PANEL
BUN: 13 mg/dL (ref 6–23)
CHLORIDE: 101 meq/L (ref 96–112)
CO2: 30 meq/L (ref 19–32)
CREATININE: 0.66 mg/dL (ref 0.40–1.20)
Calcium: 9.9 mg/dL (ref 8.4–10.5)
GFR: 120.43 mL/min (ref >60.00)
GLUCOSE: 103 mg/dL — AB (ref 70–99)
Potassium: 4.1 mEq/L (ref 3.5–5.1)
Sodium: 140 mEq/L (ref 135–145)

## 2017-06-30 LAB — CBC WITH DIFFERENTIAL/PLATELET
BASOS ABS: 0 10*3/uL (ref 0.0–0.1)
BASOS PCT: 0.5 % (ref 0.0–3.0)
EOS ABS: 0.2 10*3/uL (ref 0.0–0.7)
Eosinophils Relative: 3.4 % (ref 0.0–5.0)
HCT: 39.8 % (ref 36.0–46.0)
HEMOGLOBIN: 12.9 g/dL (ref 12.0–15.0)
Lymphocytes Relative: 37.6 % (ref 12.0–46.0)
Lymphs Abs: 2.7 10*3/uL (ref 0.7–4.0)
MCHC: 32.5 g/dL (ref 30.0–36.0)
MCV: 92.7 fl (ref 78.0–100.0)
MONO ABS: 0.6 10*3/uL (ref 0.1–1.0)
Monocytes Relative: 7.9 % (ref 3.0–12.0)
NEUTROS ABS: 3.6 10*3/uL (ref 1.4–7.7)
Neutrophils Relative %: 50.6 % (ref 43.0–77.0)
PLATELETS: 305 10*3/uL (ref 150.0–400.0)
RBC: 4.29 Mil/uL (ref 3.87–5.11)
RDW: 14.5 % (ref 11.5–15.5)
WBC: 7.2 10*3/uL (ref 4.0–10.5)

## 2017-06-30 LAB — HEPATIC FUNCTION PANEL
ALT: 15 U/L (ref 0–35)
AST: 18 U/L (ref 0–37)
Albumin: 4.1 g/dL (ref 3.5–5.2)
Alkaline Phosphatase: 78 U/L (ref 39–117)
BILIRUBIN TOTAL: 0.4 mg/dL (ref 0.2–1.2)
Bilirubin, Direct: 0.1 mg/dL (ref 0.0–0.3)
TOTAL PROTEIN: 7.3 g/dL (ref 6.0–8.3)

## 2017-06-30 LAB — LIPID PANEL
CHOLESTEROL: 226 mg/dL — AB (ref 0–200)
HDL: 74.3 mg/dL (ref >39.00)
LDL Cholesterol: 134 mg/dL — ABNORMAL HIGH (ref 0–99)
NONHDL: 151.31
TRIGLYCERIDES: 86 mg/dL (ref 0.0–149.0)
Total CHOL/HDL Ratio: 3
VLDL: 17.2 mg/dL (ref 0.0–40.0)

## 2017-06-30 LAB — HEMOGLOBIN A1C: HEMOGLOBIN A1C: 6.3 % (ref 4.6–6.5)

## 2017-06-30 LAB — VITAMIN D 25 HYDROXY (VIT D DEFICIENCY, FRACTURES): VITD: 25.48 ng/mL — AB (ref 30.00–100.00)

## 2017-06-30 LAB — TSH: TSH: 2.31 u[IU]/mL (ref 0.35–4.50)

## 2017-06-30 NOTE — Assessment & Plan Note (Signed)
Pt's PE unchanged from previous and WNL w/ exception of obesity.  UTD on mammo, pap, colonoscopy.  Pt will get flu shot today.  Stressed need for healthy diet and exercise as able in the setting of chronic low back pain.  Check labs.  Anticipatory guidance provided.

## 2017-06-30 NOTE — Assessment & Plan Note (Signed)
Pt has hx of this.  Check labs.  Replete prn. 

## 2017-06-30 NOTE — Telephone Encounter (Signed)
Last OV 03/14/17 Phentermine last filled 01/21/17 330 with 2

## 2017-06-30 NOTE — Assessment & Plan Note (Signed)
Deteriorated.  Pt has gained 11 lbs since last visit.  Stressed the need for healthy diet and exercise as able.  Check labs to risk stratify.  Will follow closely.

## 2017-06-30 NOTE — Patient Instructions (Signed)
Follow up in 2 months to recheck BP We'll notify you of your lab results and make any changes if needed Continue to work on healthy diet and exercise as able to help with weight loss and improve your overall health Mammogram is due in October- please schedule at your convenience Call with any questions or concerns Happy Fall!!

## 2017-06-30 NOTE — Progress Notes (Signed)
   Subjective:    Patient ID: [name], female    DOB: [birth year], 51 y.o.   MRN: [id number]  HPI CPE- UTD on pap, mammo, colonoscopy, Tdap.  Pt to get flu today.  Pt has gained 11 lbs since 8/22- BMI 48.26   Review of Systems Patient reports no vision/ hearing changes, adenopathy,fever, weight change,  persistant/recurrent hoarseness , swallowing issues, chest pain, palpitations, edema, persistant/recurrent cough, hemoptysis, dyspnea (rest/exertional/paroxysmal nocturnal), gastrointestinal bleeding (melena, rectal bleeding), abdominal pain, significant heartburn, bowel changes, Gyn symptoms (abnormal  bleeding, pain),  syncope, focal weakness, memory loss, numbness & tingling, skin/hair/nail changes, abnormal bruising or bleeding, anxiety, or depression.   + chronic R low back pain + urinary incontinence    Objective:   Physical Exam General Appearance:    Alert, cooperative, no distress, appears stated age, obese  Head:    Normocephalic, without obvious abnormality, atraumatic  Eyes:    PERRL, conjunctiva/corneas clear, EOM's intact, fundi    benign, both eyes  Ears:    Normal TM's and external ear canals, both ears  Nose:   Nares normal, septum midline, mucosa normal, no drainage    or sinus tenderness  Throat:   Lips, mucosa, and tongue normal; teeth and gums normal  Neck:   Supple, symmetrical, trachea midline, no adenopathy;    Thyroid: no enlargement/tenderness/nodules  Back:     Symmetric, no curvature, ROM normal, no CVA tenderness  Lungs:     Clear to auscultation bilaterally, respirations unlabored  Chest Wall:    No tenderness or deformity   Heart:    Regular rate and rhythm, S1 and S2 normal, no murmur, rub   or gallop  Breast Exam:    Deferred to GYN  Abdomen:     Soft, non-tender, bowel sounds active all four quadrants,    no masses, no organomegaly  Genitalia:    Deferred to GYN  Rectal:    Extremities:   Extremities normal, atraumatic, no cyanosis or edema    Pulses:   2+ and symmetric all extremities  Skin:   Skin color, texture, turgor normal, no rashes or lesions  Lymph nodes:   Cervical, supraclavicular, and axillary nodes normal  Neurologic:   CNII-XII intact, normal strength, sensation and reflexes    throughout          Assessment & Plan:

## 2017-06-30 NOTE — Progress Notes (Signed)
Pre visit review using our clinic review tool, if applicable. No additional management support is needed unless otherwise documented below in the visit note. 

## 2017-07-01 ENCOUNTER — Telehealth: Payer: Self-pay | Admitting: Emergency Medicine

## 2017-07-01 ENCOUNTER — Other Ambulatory Visit: Payer: Self-pay | Admitting: Family Medicine

## 2017-07-01 ENCOUNTER — Other Ambulatory Visit: Payer: Self-pay | Admitting: Adult Health

## 2017-07-01 DIAGNOSIS — E559 Vitamin D deficiency, unspecified: Secondary | ICD-10-CM

## 2017-07-01 MED ORDER — VITAMIN D3 50 MCG (2000 UT) PO TABS: 1.0000 | ORAL_TABLET | Freq: Every day | ORAL | 0 refills | Status: DC

## 2017-07-01 MED ORDER — VITAMIN D (ERGOCALCIFEROL) 1.25 MG (50000 UNIT) PO CAPS: 50000.0000 [IU] | ORAL_CAPSULE | ORAL | 0 refills | Status: DC

## 2017-07-01 NOTE — Progress Notes (Signed)
Called pt and lmovm to return call.

## 2017-07-01 NOTE — Telephone Encounter (Signed)
When talking with patient about her lab results. She states at her OV it was discussed that if she could get a letter for out of work for 90 days until her back can heal. Please advise

## 2017-07-02 NOTE — Telephone Encounter (Signed)
Called pt and left a detailed message per DPR, that Dr. [name] could not write this letter however, she could complete FMLA, or short-long term disability to allow for her back to heal.

## 2017-07-02 NOTE — Telephone Encounter (Signed)
It is not a letter I can provide, but if her company has short or long term disability paperwork I can help complete this.  I explained to pt I do not do permanent disability claims.

## 2017-07-03 ENCOUNTER — Telehealth: Payer: Self-pay | Admitting: *Deleted

## 2017-07-03 NOTE — Telephone Encounter (Signed)
FYI.   Pt wanted to advise that she had been released from PT for the next two weeks. They gave her exercises to complete and advised that if she is not feeling better that they could ask for more pt appts.   Pt was again advised to contact her HR to discuss Short-term disability when she mentioned she does not thing that she can return in October.

## 2017-07-03 NOTE — Telephone Encounter (Signed)
Patient called and asked for a call from [facility].  I asked multiple times if there is anything I could help her with. She stated that she just needed to go over some things with her and wanted a call back.

## 2017-07-15 ENCOUNTER — Encounter: Payer: Self-pay | Admitting: Pulmonary Disease

## 2017-07-16 ENCOUNTER — Ambulatory Visit (INDEPENDENT_AMBULATORY_CARE_PROVIDER_SITE_OTHER): Payer: BLUE CROSS/BLUE SHIELD | Admitting: Pulmonary Disease

## 2017-07-16 ENCOUNTER — Encounter: Payer: Self-pay | Admitting: Pulmonary Disease

## 2017-07-16 DIAGNOSIS — J4530 Mild persistent asthma, uncomplicated: Secondary | ICD-10-CM

## 2017-07-16 DIAGNOSIS — G4733 Obstructive sleep apnea (adult) (pediatric): Secondary | ICD-10-CM | POA: Diagnosis not present

## 2017-07-16 MED ORDER — BUDESONIDE-FORMOTEROL FUMARATE 80-4.5 MCG/ACT IN AERO: 2.0000 | INHALATION_SPRAY | Freq: Two times a day (BID) | RESPIRATORY_TRACT | 0 refills | Status: DC

## 2017-07-16 MED ORDER — ALBUTEROL SULFATE HFA 108 (90 BASE) MCG/ACT IN AERS
2.0000 | INHALATION_SPRAY | RESPIRATORY_TRACT | 6 refills | Status: DC | PRN
Start: 2017-07-16 — End: 2019-06-18

## 2017-07-16 NOTE — Addendum Note (Signed)
Addended by: Maurene Capes on: 07/16/2017 11:07 AM   Modules accepted: Orders

## 2017-07-16 NOTE — Assessment & Plan Note (Addendum)
Avg CPAP seems to be 11 cm. Good compliance, on auto settings  Weight loss encouraged, compliance with goal of at least 4-6 hrs every night is the expectation. Advised against medications with sedative side effects Cautioned against driving when sleepy - understanding that sleepiness will vary on a day to day basis

## 2017-07-16 NOTE — Assessment & Plan Note (Signed)
Decrease to Symbicort 80-2 puffs twice daily  Prescription for albuterol MDI 2 puffs every 6 hours as needed only for wheezing  If continues to do better, consider step down further to inhaled steroid only

## 2017-07-16 NOTE — Addendum Note (Signed)
Addended by: Maurene Capes on: 07/16/2017 11:19 AM   Modules accepted: Orders

## 2017-07-16 NOTE — Progress Notes (Signed)
   Subjective:    Patient ID: [name], female    DOB: 11-Sep-1966, 51 y.o.   MRN: [id number]  HPI  67 -year-old never smoker for FU of asthma and mod obstructive sleep apnea.   Asthma was diagnosed in 2006. Her triggers include uri and weather changes.She works for [employer] and runs a Air[employer]. She has not had formal allergy testing.    One-year follow-up, she has severe sciatica and is now out of work. Asthma is well controlled, has not needed rescue inhaler for a few months but still needs prescription since the inhaler that she has is very old. She has seasonal allergies worse in the fall and takes Zyrtec and Singulair daily  She is compliant with her CPAP machine, denies any problems with mask or pressure, no nasal congestion or dryness. Download was reviewed which shows excellent compliance on average 11 cm      Significant tests/ events  01/29/2012 Spirometry showed no airway obstruction, FEV1 was 82% FVC was 79% ratio 79  PSG 2013 moderate obstructive sleep apnea - AHI 18/h     Review of Systems neg for any significant sore throat, dysphagia, itching, sneezing, nasal congestion or excess/ purulent secretions, fever, chills, sweats, unintended wt loss, pleuritic or exertional cp, hempoptysis, orthopnea pnd or change in chronic leg swelling. Also denies presyncope, palpitations, heartburn, abdominal pain, nausea, vomiting, diarrhea or change in bowel or urinary habits, dysuria,hematuria, rash, arthralgias, visual complaints, headache, numbness weakness or ataxia.     Objective:   Physical Exam  Gen. Pleasant, obese, in no distress ENT - class 2 airway, no post nasal drip Neck: No JVD, no thyromegaly, no carotid bruits Lungs: no use of accessory muscles, no dullness to percussion, decreased without rales or rhonchi  Cardiovascular: Rhythm regular, heart sounds  normal, no murmurs or gallops, no peripheral edema Musculoskeletal: No deformities, no  cyanosis or clubbing , no tremors        Assessment & Plan:

## 2017-07-16 NOTE — Patient Instructions (Signed)
Decrease to Symbicort 80-2 puffs twice daily  Prescription for albuterol MDI 2 puffs every 6 hours as needed only for wheezing

## 2017-07-21 ENCOUNTER — Ambulatory Visit
Admission: RE | Admit: 2017-07-21 | Discharge: 2017-07-21 | Disposition: A | Discharge status: Home / Self Care | Payer: BLUE CROSS/BLUE SHIELD | Source: Ambulatory Visit | Attending: Family Medicine | Admitting: Family Medicine

## 2017-07-21 DIAGNOSIS — Z1239 Encounter for other screening for malignant neoplasm of breast: Secondary | ICD-10-CM

## 2017-08-29 ENCOUNTER — Encounter: Payer: Self-pay | Admitting: Family Medicine

## 2017-08-29 ENCOUNTER — Ambulatory Visit (INDEPENDENT_AMBULATORY_CARE_PROVIDER_SITE_OTHER): Payer: BLUE CROSS/BLUE SHIELD | Admitting: Family Medicine

## 2017-08-29 ENCOUNTER — Other Ambulatory Visit: Payer: Self-pay

## 2017-08-29 VITALS — BP 142/100 | HR 92 | Temp 98.3°F | Resp 18 | Ht 65.0 in | Wt 292.0 lb

## 2017-08-29 DIAGNOSIS — I10 Essential (primary) hypertension: Secondary | ICD-10-CM

## 2017-08-29 MED ORDER — HYDROCHLOROTHIAZIDE 12.5 MG PO TABS: 12.5000 mg | ORAL_TABLET | Freq: Every day | ORAL | 3 refills | Status: DC

## 2017-08-29 NOTE — Assessment & Plan Note (Signed)
Deteriorated.  Pt has gained 2 lbs since last visit.  Is not able to exercise due to ongoing pain issues w/ her back.  Stressed need for healthy diet.  Due to elevated BP, will not start weight loss medication today and stressed the need for a non-stimulant weight loss med.  Will follow closely w/ the intention to start something like Contrave or Belviq next visit.  Pt expressed understanding and is in agreement w/ plan.

## 2017-08-29 NOTE — Patient Instructions (Signed)
Follow up in 3-4 weeks to recheck BP START the HCTZ daily Drink plenty of water Limit your salt intake Continue to work on healthy diet and exercise as able We will try and start a weight loss medication at your next visit Call with any questions or concerns Hang in there!!! Happy Thanksgiving!

## 2017-08-29 NOTE — Progress Notes (Signed)
   Subjective:    Patient ID: [name], female    DOB: 09/14/1966, 51 y.o.   MRN: [id number]  HPI HTN- new.  Pt's BP is again elevated today- officially making her hypertensive.  Pt reports she's under a lot of stress recently w/ Workman's Comp and STD.  Denies CP, SOB, HAs, visual changes, edema  Obesity- ongoing issue.  Pt's BMI is 48.  She has gained 2 lbs since Sept.  She is tearful b/c she wants to start a weight loss medication but knows she cannot do so until BP is normal.   Review of Systems For ROS see HPI     Objective:   Physical Exam  Constitutional: She is oriented to person, place, and time. She appears well-developed and well-nourished. No distress.  HENT:  Head: Normocephalic and atraumatic.  Eyes: Conjunctivae and EOM are normal. Pupils are equal, round, and reactive to light.  Neck: Normal range of motion. Neck supple. No thyromegaly present.  Cardiovascular: Normal rate, regular rhythm, normal heart sounds and intact distal pulses.  No murmur heard. Pulmonary/Chest: Effort normal and breath sounds normal. No respiratory distress.  Abdominal: Soft. She exhibits no distension. There is no tenderness.  Musculoskeletal: She exhibits no edema.  Lymphadenopathy:    She has no cervical adenopathy.  Neurological: She is alert and oriented to person, place, and time.  Skin: Skin is warm and dry.  Psychiatric: She has a normal mood and affect. Her behavior is normal.  Vitals reviewed.         Assessment & Plan:

## 2017-08-29 NOTE — Assessment & Plan Note (Signed)
New.  Pt's elevated BP today puts her in the hypertensive range.  She is asymptomatic.  Will start HCTZ daily.  Reviewed need for low sodium diet, regular exercise, and stress management.  Will follow closely.

## 2017-09-17 ENCOUNTER — Other Ambulatory Visit: Payer: Self-pay | Admitting: Family Medicine

## 2017-09-17 DIAGNOSIS — E559 Vitamin D deficiency, unspecified: Secondary | ICD-10-CM

## 2017-09-19 ENCOUNTER — Other Ambulatory Visit: Payer: Self-pay | Admitting: Pulmonary Disease

## 2017-09-22 ENCOUNTER — Other Ambulatory Visit: Payer: Self-pay | Admitting: Pulmonary Disease

## 2017-09-24 ENCOUNTER — Other Ambulatory Visit: Payer: Self-pay

## 2017-09-24 ENCOUNTER — Encounter: Payer: Self-pay | Admitting: Family Medicine

## 2017-09-24 ENCOUNTER — Ambulatory Visit (INDEPENDENT_AMBULATORY_CARE_PROVIDER_SITE_OTHER): Payer: BLUE CROSS/BLUE SHIELD | Admitting: Family Medicine

## 2017-09-24 VITALS — BP 136/83 | HR 88 | Temp 98.1°F | Resp 16 | Ht 65.0 in | Wt 293.2 lb

## 2017-09-24 DIAGNOSIS — I10 Essential (primary) hypertension: Secondary | ICD-10-CM

## 2017-09-24 LAB — BASIC METABOLIC PANEL
BUN: 12 mg/dL (ref 6–23)
CALCIUM: 9.5 mg/dL (ref 8.4–10.5)
CO2: 31 mEq/L (ref 19–32)
CREATININE: 0.68 mg/dL (ref 0.40–1.20)
Chloride: 98 mEq/L (ref 96–112)
GFR: 116.24 mL/min (ref >60.00)
GLUCOSE: 103 mg/dL — AB (ref 70–99)
Potassium: 4.2 mEq/L (ref 3.5–5.1)
SODIUM: 138 meq/L (ref 135–145)

## 2017-09-24 MED ORDER — NALTREXONE-BUPROPION HCL ER 8-90 MG PO TB12: ORAL_TABLET | ORAL | 0 refills | Status: DC

## 2017-09-24 NOTE — Assessment & Plan Note (Signed)
Ongoing issue.  Pt is interested in medication to assist w/ weight loss as she is very limited in her ability to exercise due to ongoing back issues.  Since BP is better controlled, will start Contrave (will not start stimulant due to BP issues).  Will follow closely.

## 2017-09-24 NOTE — Patient Instructions (Addendum)
Follow up in 1 month to recheck BP and weight loss We'll notify you of your lab results and make any changes if needed Continue the HCTZ daily- the BP looks better!!! START the Contrave as directed- start with 1 tab daily and then increase as directed The Contrave HAS to be combined with healthy diet and exercise as able in order for it to be most effective Call with any questions or concerns MERRY CHRISTMAS!!!

## 2017-09-24 NOTE — Progress Notes (Signed)
   Subjective:    Patient ID: [name], female    DOB: 01/03/1966, 51 y.o.   MRN: [id number]  HPI HTN- started on HCTZ 12.5mg  at last visit.  BP is better today.  Pt reports feeling better since starting medication.  Denies HAs, visual changes, CP, SOB, edema.  Obesity- pt still wants to start weight loss medication.  Limited ability to exercise due to chronic back pain.  Her weight is causing feelings of sadness and is frustrating her.   Review of Systems For ROS see HPI     Objective:   Physical Exam  Constitutional: She is oriented to person, place, and time. She appears well-developed and well-nourished. No distress.  obese  HENT:  Head: Normocephalic and atraumatic.  Eyes: Conjunctivae and EOM are normal. Pupils are equal, round, and reactive to light.  Neck: Normal range of motion. Neck supple. No thyromegaly present.  Cardiovascular: Normal rate, regular rhythm, normal heart sounds and intact distal pulses.  No murmur heard. Pulmonary/Chest: Effort normal and breath sounds normal. No respiratory distress.  Abdominal: Soft. She exhibits no distension. There is no tenderness.  Musculoskeletal: She exhibits no edema.  Lymphadenopathy:    She has no cervical adenopathy.  Neurological: She is alert and oriented to person, place, and time.  Skin: Skin is warm and dry.  Psychiatric: She has a normal mood and affect. Her behavior is normal.  Vitals reviewed.         Assessment & Plan:

## 2017-09-24 NOTE — Assessment & Plan Note (Signed)
Started on meds at last visit.  BP is now adequately- but not ideally- controlled.  Stressed need for low salt diet, regular exercise (as able), and continued use of HCTZ.  Recheck BMP due to addition of diuretic.  Pt expressed understanding and is in agreement w/ plan.

## 2017-09-25 ENCOUNTER — Telehealth: Payer: Self-pay | Admitting: Emergency Medicine

## 2017-09-25 NOTE — Telephone Encounter (Signed)
Prior authorization initiated from the [facility].  OZHYQM:[id number];XBMWUX:LKGMWNUUCaseId:[id number];Status:Approved;Review Type:Prior Auth;Coverage Start Date:08/26/2017;Coverage End Date:01/23/2018;  Will notify patient pharmacy of approval

## 2017-09-29 ENCOUNTER — Ambulatory Visit
Admission: RE | Admit: 2017-09-29 | Discharge: 2017-09-29 | Disposition: A | Discharge status: Home / Self Care | Payer: BLUE CROSS/BLUE SHIELD | Source: Ambulatory Visit | Attending: Pain Medicine | Admitting: Pain Medicine

## 2017-09-29 ENCOUNTER — Other Ambulatory Visit: Payer: Self-pay | Admitting: Pain Medicine

## 2017-09-29 DIAGNOSIS — R1031 Right lower quadrant pain: Secondary | ICD-10-CM

## 2017-09-29 DIAGNOSIS — M25551 Pain in right hip: Secondary | ICD-10-CM

## 2017-10-02 ENCOUNTER — Telehealth: Payer: Self-pay | Admitting: Family Medicine

## 2017-10-02 NOTE — Telephone Encounter (Signed)
Copied from CRM #[id number]. Topic: Inquiry >> Oct 02, 2017  3:43 PM [name] I, NT wrote: Reason for CRM: pt said and said she can not afford the Med for weight lost if you call something cheaper   

## 2017-10-02 NOTE — Telephone Encounter (Signed)
Copied from CRM [phone]#[id number]. Topic: Inquiry >> Oct 02, 2017  3:43 PM Anice PaganiniMunoz, Geneve Kimpel I, NT wrote: Reason for CRM: pt said and said she can not afford the Med for weight lost if you call something cheaper

## 2017-10-03 NOTE — Telephone Encounter (Signed)
There are other options but will have to wait until PCP returns next week. Please make sure patient is aware PCP will review on return Monday.

## 2017-10-03 NOTE — Telephone Encounter (Signed)
Patient cannot afford Contrave. Anything else that could be called in?

## 2017-10-06 ENCOUNTER — Telehealth: Payer: Self-pay | Admitting: Family Medicine

## 2017-10-06 NOTE — Telephone Encounter (Signed)
Almost all weight loss medications (Contrave, Belviq, Qsymia) are pricey.  The only one that is affordable is Phentermine and this is not meant for long term use and can have side effects (increased BP and HR).  If she wants to start Phentermine 37.5mg  daily it is only for 12 weeks (#30, 2).  Pt will also need appt in 2 weeks to recheck BP

## 2017-10-06 NOTE — Telephone Encounter (Signed)
Copied from CRM [id number]. Topic: Quick Communication - Rx Refill/Question >> Oct 06, 2017 12:49 PM Jonette EvaBarksdale, Harvey B wrote: Pt states she wants to start on the phentermine(weight loss pill)

## 2017-10-06 NOTE — Telephone Encounter (Signed)
Patient would like to hold off on the phentermine for right now.  She states that she is going to work on diet and exercise and come back for her follow-up in Jan.

## 2017-10-15 ENCOUNTER — Telehealth: Payer: Self-pay | Admitting: General Practice

## 2017-10-15 NOTE — Telephone Encounter (Signed)
Pt previously stated (in phone note) she wanted to hold off on Phentermine and work on diet and exercise.  No meds at this time

## 2017-10-15 NOTE — Telephone Encounter (Signed)
This medication was not covered by insurance. Alternatives to Contrave that were recommended were diethylpropion 25mg , phentermine 30mg , benzphentermine 50mg .

## 2017-10-16 NOTE — Telephone Encounter (Signed)
Noted  

## 2017-10-22 ENCOUNTER — Other Ambulatory Visit: Payer: Self-pay

## 2017-10-22 ENCOUNTER — Ambulatory Visit (INDEPENDENT_AMBULATORY_CARE_PROVIDER_SITE_OTHER): Payer: BLUE CROSS/BLUE SHIELD | Admitting: Family Medicine

## 2017-10-22 ENCOUNTER — Encounter: Payer: Self-pay | Admitting: Family Medicine

## 2017-10-22 VITALS — BP 139/89 | HR 96 | Temp 98.0°F | Resp 17 | Ht 65.0 in | Wt 292.5 lb

## 2017-10-22 DIAGNOSIS — I10 Essential (primary) hypertension: Secondary | ICD-10-CM

## 2017-10-22 NOTE — Assessment & Plan Note (Signed)
Chronic problem.  BP is mildly elevated today but pt reports high stress levels.  Discussed adding ACE/ARB in the form of a combination pill but pt would like to continue to work on healthy diet and regular exercise prior to making medication changes.  Will follow closely.

## 2017-10-22 NOTE — Progress Notes (Signed)
   Subjective:    Patient ID: [name], female    DOB: 08/08/1966, [age] y.o.   MRN: [id number]  HPI HTN- ongoing issue for pt.  BP was better controlled at last visit and is again elevated today.  On HCTZ 12.5mg  daily.  Pt reports 'slight headache' this week.  Denies CP, SOB above baseline, visual changes.  Pt is under a lot of stress but 'things are getting better'.  Obesity- ongoing issue for pt.  Contrave was too expensive.  Pt has stopped drinking sodas as of Jan 1.  Attempting to walk more regularly.     Review of Systems For ROS see HPI     Objective:   Physical Exam  Constitutional: She is oriented to person, place, and time. She appears well-developed and well-nourished. No distress.  obese  HENT:  Head: Normocephalic and atraumatic.  Eyes: Conjunctivae and EOM are normal. Pupils are equal, round, and reactive to light.  Neck: Normal range of motion. Neck supple. No thyromegaly present.  Cardiovascular: Normal rate, regular rhythm, normal heart sounds and intact distal pulses.  No murmur heard. Pulmonary/Chest: Effort normal and breath sounds normal. No respiratory distress.  Abdominal: Soft. She exhibits no distension. There is no tenderness.  Musculoskeletal: She exhibits no edema.  Lymphadenopathy:    She has no cervical adenopathy.  Neurological: She is alert and oriented to person, place, and time.  Skin: Skin is warm and dry.  Psychiatric: She has a normal mood and affect. Her behavior is normal.  Vitals reviewed.         Assessment & Plan:

## 2017-10-22 NOTE — Patient Instructions (Signed)
Follow up in 4-6 weeks to recheck BP and weight loss progress Continue the HCTZ daily Keep up the good work on healthy food and drink choices! Try and get physical activity most days of the week for at least 30 minutes Call with any questions or concerns Happy New Year!!!

## 2017-10-22 NOTE — Assessment & Plan Note (Signed)
Ongoing issue.  Pt did not start Contrave b/c it was too expensive.  I am not willing to prescribe a stimulant due to increased BP.  Stressed need for healthy diet and regular exercise and that this will take a serious commitment on her part.  Will follow.

## 2017-10-31 ENCOUNTER — Telehealth: Payer: Self-pay | Admitting: Family Medicine

## 2017-10-31 NOTE — Telephone Encounter (Signed)
Routed to provider

## 2017-10-31 NOTE — Telephone Encounter (Signed)
Pt's BP was elevated at OV.  Not able to prescribe Phentermine at this time

## 2017-10-31 NOTE — Telephone Encounter (Signed)
Patient notified of PCP recommendations and is agreement and expresses an understanding.   Ok for PEC to Discuss results / PCP recommendations / Schedule patient.   

## 2017-10-31 NOTE — Telephone Encounter (Signed)
Copied from CRM [phone]. Topic: Quick Communication - Rx Refill/Question >> Oct 31, 2017  1:13 PM Louie BunPalacios Medina, Rosey Batheresa D wrote: Medication: Phentermine HCl 37.5 mg Oral BH-each morning     Has the patient contacted their pharmacy? Yes   (Agent: If no, request that the patient contact the pharmacy for the refill.)   Preferred Pharmacy (with phone number or street name):   [id number] - [place], [place] - [address]      Agent: Please be advised that RX refills may take up to 3 business days. We ask that you follow-up with your pharmacy.

## 2017-10-31 NOTE — Telephone Encounter (Signed)
Last OV 10/22/17 Please advise per that note   "Pt did not start Contrave b/c it was too expensive.  I am not willing to prescribe a stimulant due to increased BP.  Stressed need for healthy diet and regular exercise and that this will take a serious commitment on her part.  Will follow."

## 2017-11-26 ENCOUNTER — Ambulatory Visit (INDEPENDENT_AMBULATORY_CARE_PROVIDER_SITE_OTHER): Payer: BLUE CROSS/BLUE SHIELD | Admitting: Family Medicine

## 2017-11-26 ENCOUNTER — Encounter: Payer: Self-pay | Admitting: Family Medicine

## 2017-11-26 ENCOUNTER — Other Ambulatory Visit: Payer: Self-pay

## 2017-11-26 VITALS — BP 130/82 | HR 96 | Temp 98.0°F | Resp 16 | Ht 65.0 in | Wt 294.0 lb

## 2017-11-26 DIAGNOSIS — I10 Essential (primary) hypertension: Secondary | ICD-10-CM

## 2017-11-26 MED ORDER — PHENTERMINE HCL 37.5 MG PO TABS: 37.5000 mg | ORAL_TABLET | Freq: Every day | ORAL | 2 refills | Status: DC

## 2017-11-26 NOTE — Assessment & Plan Note (Signed)
Deteriorated.  Pt continues to gain weight.  At this time, she is asking to restart Phentermine to jumpstart weight loss.  Stressed the fact that this needs to be combined w/ healthy diet and regular exercise in order to be effective and that medication alone won't do it.  Pt expressed understanding and is in agreement w/ plan. Will follow.

## 2017-11-26 NOTE — Assessment & Plan Note (Signed)
Chronic problem.  Adequate control today.  Asymptomatic.  Continue HCTZ at this time.  Will follow.

## 2017-11-26 NOTE — Patient Instructions (Signed)
Follow up in 6 weeks to recheck BP and weight loss progress Continue the HCTZ daily for the blood pressure START the phentermine daily in the AM Combine the Phentermine with healthy diet and regular exercise in order for it to be effective Call with any questions or concerns Happy Valentine's Day!

## 2017-11-26 NOTE — Progress Notes (Signed)
   Subjective:    Patient ID: [name], female    DOB: 02/17/1966, 52 y.o.   MRN: [id number]  HPI HTN- chronic problem.  BP is better today at 130/82 on HCTZ 12.5mg  daily.  Denies CP, SOB, HAs, visual changes, edema.  Obesity- pt's weight is unchanged and BMI is 48.92  Pt is doing a 'walking tape' at home 2-3x/week.  Pt is interested in retrying phentermine to jumpstart weight loss.    Review of Systems For ROS see HPI     Objective:   Physical Exam  Constitutional: She is oriented to person, place, and time. She appears well-developed and well-nourished. No distress.  obese  HENT:  Head: Normocephalic and atraumatic.  Eyes: Conjunctivae and EOM are normal. Pupils are equal, round, and reactive to light.  Neck: Normal range of motion. Neck supple. No thyromegaly present.  Cardiovascular: Normal rate, regular rhythm, normal heart sounds and intact distal pulses.  No murmur heard. Pulmonary/Chest: Effort normal and breath sounds normal. No respiratory distress.  Abdominal: Soft. She exhibits no distension. There is no tenderness.  Musculoskeletal: She exhibits no edema.  Lymphadenopathy:    She has no cervical adenopathy.  Neurological: She is alert and oriented to person, place, and time.  Skin: Skin is warm and dry.  Psychiatric: She has a normal mood and affect. Her behavior is normal.  Vitals reviewed.         Assessment & Plan:

## 2017-12-22 ENCOUNTER — Other Ambulatory Visit: Payer: Self-pay | Admitting: Family Medicine

## 2018-01-05 ENCOUNTER — Encounter: Payer: Self-pay | Admitting: Family Medicine

## 2018-01-05 ENCOUNTER — Ambulatory Visit: Payer: Self-pay | Admitting: Family Medicine

## 2018-01-05 ENCOUNTER — Other Ambulatory Visit: Payer: Self-pay

## 2018-01-05 DIAGNOSIS — M25562 Pain in left knee: Secondary | ICD-10-CM

## 2018-01-05 DIAGNOSIS — I10 Essential (primary) hypertension: Secondary | ICD-10-CM

## 2018-01-05 NOTE — Assessment & Plan Note (Signed)
Deteriorated since starting the Phentermine.  Encouraged pt to stop medication but she declines and states she will finish her prescription.  Stressed need for healthy diet and regular exercise to improve BP.  Pt cautioned that if BP is consistently above 140/90, she will need to stop meds and schedule appt.  Will continue to follow at future visits.

## 2018-01-05 NOTE — Progress Notes (Signed)
   Subjective:    Patient ID: [name], female    DOB: 04/03/1966, 52 y.o.   MRN: [id number]  HPI Obesity- chronic problem.  Pt was started on Phentermine at last visit.  She has actually gained 1 lb and BP is again up- both systolic and diastolic are up 10 points.  Pt is taking the phentermine but not exercising and has not changed diet.  No CP, SOB, palpitations.  L knee pain- pt reports L knee pain due to compensation for her R sided chronic pain.  Pt is rubbing her knee constantly during visit.  Is on NSAIDs and Relafen  Review of Systems For ROS see HPI     Objective:   Physical Exam  Constitutional: She is oriented to person, place, and time. She appears well-developed and well-nourished. No distress.  Morbidly obese  HENT:  Head: Normocephalic and atraumatic.  Eyes: Pupils are equal, round, and reactive to light. Conjunctivae and EOM are normal.  Neck: Normal range of motion. Neck supple. No thyromegaly present.  Cardiovascular: Normal rate, regular rhythm, normal heart sounds and intact distal pulses.  No murmur heard. Pulmonary/Chest: Effort normal and breath sounds normal. No respiratory distress.  Abdominal: Soft. She exhibits no distension. There is no tenderness.  Musculoskeletal: She exhibits no edema.  Lymphadenopathy:    She has no cervical adenopathy.  Neurological: She is alert and oriented to person, place, and time.  Skin: Skin is warm and dry.  Psychiatric: She has a normal mood and affect. Her behavior is normal.  Vitals reviewed.         Assessment & Plan:  L knee pain- pt has NSAIDs and muscle relaxers available.  Will refer to ortho for complete evaluation and tx.  Pt expressed understanding and is in agreement w/ plan.

## 2018-01-05 NOTE — Assessment & Plan Note (Signed)
Ongoing issue.  Pt has not lost any weight w/ Phentermine and has actually gained a lb.  Stressed need for healthy diet and regular exercise and that the medication alone will not be effective.  Will follow.

## 2018-01-05 NOTE — Patient Instructions (Signed)
Follow up in 6-8 weeks to recheck BP and weight loss progress We'll call you with your Ortho appt for the knee pain Please make healthy food choices and try and get regular exercise to help w/ weight loss Continue your check your blood pressure and if it is consistently over 140/90, please stop your phentermine Call with any questions or concerns Hang in there!!!

## 2018-01-07 ENCOUNTER — Ambulatory Visit (INDEPENDENT_AMBULATORY_CARE_PROVIDER_SITE_OTHER): Payer: BLUE CROSS/BLUE SHIELD | Admitting: Adult Health

## 2018-01-07 ENCOUNTER — Encounter: Payer: Self-pay | Admitting: Adult Health

## 2018-01-07 DIAGNOSIS — G4733 Obstructive sleep apnea (adult) (pediatric): Secondary | ICD-10-CM

## 2018-01-07 DIAGNOSIS — J4530 Mild persistent asthma, uncomplicated: Secondary | ICD-10-CM

## 2018-01-07 MED ORDER — BUDESONIDE-FORMOTEROL FUMARATE 80-4.5 MCG/ACT IN AERO: INHALATION_SPRAY | RESPIRATORY_TRACT | 5 refills | Status: DC

## 2018-01-07 NOTE — Patient Instructions (Signed)
Continue on current regimen .  May use Zyrtec 10mg  At bedtime  As needed  Drainage.  Continue on CPAP At bedtime   Do not drive if sleepy  Work on healthy weight .  CPAP download .  Follow up with Dr. Vassie LollAlva  In 1 year and As needed

## 2018-01-07 NOTE — Progress Notes (Signed)
 @Patient  ID: [name], female    DOB: 01/26/1966, 52 y.o.   MRN: [id number]  Chief Complaint  Patient presents with  . Follow-up    Asthma     Referring provider: Sheliah Hatchabori, Katherine E, MD  HPI: 52 year old female never smoker followed for asthma and obstructive sleep apnea   Significant tests/ events  01/29/2012 Spirometry showed no airway obstruction, FEV1 was 82% FVC was 79% ratio 79  PSG 2013 moderate obstructive sleep apnea - AHI 18/h   01/07/2018 Follow up : Asthma and OSA  Patient returns for 3362-month follow-up for asthma.  She says that she is doing well.  She denies any flare of cough or wheezing.  She is on Symbicort twice daily.  Patient does not have any insurance and is requesting samples.  We discussed patient assistance program. Rare albuterol use.   Patient has obstructive sleep apnea.  On CPAP at bedtime.  Patient says she feels she benefits and is rested with no significant daytime sleepiness.  Download has been requested  Has allergic rhinitis on Singulair daily. Denies flare of sinus.    No Known Allergies  Immunization History  Administered Date(s) Administered  . Influenza Split 07/09/2011  . Influenza Whole 07/31/2012  . Influenza,inj,Quad PF,6+ Mos 07/01/2013, 07/29/2014, 08/25/2015, 06/14/2016, 06/30/2017  . Tdap 07/09/2011    Past Medical History:  Diagnosis Date  . Allergy   . Asthma   . Bronchitis   . GERD (gastroesophageal reflux disease)   . Sleep apnea    uses c pap     Tobacco History: Social History   Tobacco Use  Smoking Status Never Smoker  Smokeless Tobacco Never Used   Counseling given: Not Answered   Outpatient Encounter Medications as of 01/07/2018  Medication Sig  . albuterol (PROAIR HFA) 108 (90 Base) MCG/ACT inhaler Inhale 2 puffs into the lungs every 4 (four) hours as needed for wheezing or shortness of breath.  Marland Kitchen. albuterol (PROVENTIL) (2.5 MG/3ML) 0.083% nebulizer solution Take 3 mLs (2.5 mg total) by  nebulization every 6 (six) hours as needed for wheezing.  . Cholecalciferol (VITAMIN D3) 2000 units TABS Take 1 tablet by mouth daily.  . cyclobenzaprine (FLEXERIL) 10 MG tablet Take 1 tablet (10 mg total) by mouth 2 (two) times daily as needed for muscle spasms.  . hydrochlorothiazide (HYDRODIURIL) 12.5 MG tablet TAKE 1 TABLET (12.5 MG TOTAL) DAILY BY MOUTH.  Marland Kitchen. ibuprofen (ADVIL,MOTRIN) 800 MG tablet prn  . montelukast (SINGULAIR) 10 MG tablet TAKE 1 TABLET BY MOUTH EVERYDAY AT BEDTIME  . nabumetone (RELAFEN) 500 MG tablet Take 500 mg 2 (two) times daily as needed by mouth.  . Nebulizers (COMPRESSOR/NEBULIZER) MISC As directed  . phentermine (ADIPEX-P) 37.5 MG tablet Take 1 tablet (37.5 mg total) by mouth daily before breakfast.  . SYMBICORT 80-4.5 MCG/ACT inhaler TAKE 2 PUFFS BY MOUTH TWICE A DAY   No facility-administered encounter medications on file as of 01/07/2018.      Review of Systems  Constitutional:   No  weight loss, night sweats,  Fevers, chills, fatigue, or  lassitude.  HEENT:   No headaches,  Difficulty swallowing,  Tooth/dental problems, or  Sore throat,                No sneezing, itching, ear ache, nasal congestion, post nasal drip,   CV:  No chest pain,  Orthopnea, PND, swelling in lower extremities, anasarca, dizziness, palpitations, syncope.   GI  No heartburn, indigestion, abdominal pain, nausea, vomiting, diarrhea, change in  bowel habits, loss of appetite, bloody stools.   Resp: No shortness of breath with exertion or at rest.  No excess mucus, no productive cough,  No non-productive cough,  No coughing up of blood.  No change in color of mucus.  No wheezing.  No chest wall deformity  Skin: no rash or lesions.  GU: no dysuria, change in color of urine, no urgency or frequency.  No flank pain, no hematuria   MS:  No joint pain or swelling.  No decreased range of motion.  No back pain.    Physical Exam  BP 128/80 (BP Location: Right Arm, Cuff Size: Large)    Pulse 96   Ht 5\' 5"  (1.651 m)   Wt 293 lb (132.9 kg)   SpO2 96%   BMI 48.76 kg/m   GEN: A/Ox3; pleasant , NAD, obese    HEENT:  [place]/AT,  EACs-clear, TMs-wnl, NOSE-clear, THROAT-clear, no lesions, no postnasal drip or exudate noted. Class 3 MP airway   NECK:  Supple w/ fair ROM; no JVD; normal carotid impulses w/o bruits; no thyromegaly or nodules palpated; no lymphadenopathy.    RESP  Clear  P & A; w/o, wheezes/ rales/ or rhonchi. no accessory muscle use, no dullness to percussion  CARD:  RRR, no m/r/g, no peripheral edema, pulses intact, no cyanosis or clubbing.  GI:   Soft & nt; nml bowel sounds; no organomegaly or masses detected.   Musco: Warm bil, no deformities or joint swelling noted.   Neuro: alert, no focal deficits noted.    Skin: Warm, no lesions or rashes    Lab Results:    BNP No results found for: BNP  ProBNP No results found for: PROBNP  Imaging: No results found.   Assessment & Plan:   Asthma Well-controlled on current regimen.  Samples and patient assistance discussed with patient  Plan  Patient Instructions  Continue on current regimen .  May use Zyrtec 10mg  At bedtime  As needed  Drainage.  Continue on CPAP At bedtime   Do not drive if sleepy  Work on healthy weight .  CPAP download .  Follow up with Dr. Vassie Loll  In 1 year and As needed       OSA (obstructive sleep apnea) Controlled on CPAP  Plan  Patient Instructions  Continue on current regimen .  May use Zyrtec 10mg  At bedtime  As needed  Drainage.  Continue on CPAP At bedtime   Do not drive if sleepy  Work on healthy weight .  CPAP download .  Follow up with Dr. Vassie Loll  In 1 year and As needed          Rubye Oaks, NP 01/07/2018

## 2018-01-07 NOTE — Assessment & Plan Note (Signed)
Controlled on CPAP  Plan  Patient Instructions  Continue on current regimen .  May use Zyrtec 10mg  At bedtime  As needed  Drainage.  Continue on CPAP At bedtime   Do not drive if sleepy  Work on healthy weight .  CPAP download .  Follow up with Dr. Vassie LollAlva  In 1 year and As needed

## 2018-01-07 NOTE — Addendum Note (Signed)
Addended by: Cydney OkAUGUSTIN, Sabah Zucco N on: 01/07/2018 12:13 PM   Modules accepted: Orders

## 2018-01-07 NOTE — Assessment & Plan Note (Signed)
Well-controlled on current regimen.  Samples and patient assistance discussed with patient  Plan  Patient Instructions  Continue on current regimen .  May use Zyrtec 10mg  At bedtime  As needed  Drainage.  Continue on CPAP At bedtime   Do not drive if sleepy  Work on healthy weight .  CPAP download .  Follow up with Dr. Vassie LollAlva  In 1 year and As needed

## 2018-01-13 ENCOUNTER — Ambulatory Visit (INDEPENDENT_AMBULATORY_CARE_PROVIDER_SITE_OTHER): Payer: Self-pay

## 2018-01-13 ENCOUNTER — Ambulatory Visit (INDEPENDENT_AMBULATORY_CARE_PROVIDER_SITE_OTHER): Payer: Self-pay | Admitting: Orthopaedic Surgery

## 2018-01-13 DIAGNOSIS — M25562 Pain in left knee: Secondary | ICD-10-CM

## 2018-01-13 MED ORDER — IBUPROFEN 800 MG PO TABS: 800.0000 mg | ORAL_TABLET | Freq: Three times a day (TID) | ORAL | 2 refills | Status: DC | PRN

## 2018-01-13 NOTE — Progress Notes (Signed)
Office Visit Note   Patient: [name]           Date of Birth: [birth year]           MRN: [id number] Visit Date: 01/13/2018              Requested by: Sheliah Hatch, MD [address] [place], [place] [address] PCP: Sheliah Hatch, MD   Assessment & Plan: Visit Diagnoses:  1. Acute pain of left knee     Plan: Impression is left knee pain.  Sounds like she is likely having an arthritis flareup but this is improving with symptomatic treatment.  Patient declined cortisone injection.  We discussed the importance of weight loss and exercise.  Home exercises provided.  Questions encouraged and answered.  Prescription for ibuprofen.  Follow-up as needed.  Follow-Up Instructions: Return if symptoms worsen or fail to improve.   Orders:  Orders Placed This Encounter  Procedures  . XR KNEE 3 VIEW LEFT   Meds ordered this encounter  Medications  . ibuprofen (ADVIL,MOTRIN) 800 MG tablet    Sig: Take 1 tablet (800 mg total) by mouth every 8 (eight) hours as needed.    Dispense:  30 tablet    Refill:  2      Procedures: No procedures performed   Clinical Data: No additional findings.   Subjective: Chief Complaint  Patient presents with  . Left Knee - Pain    Patient is a 52 year old female comes in with left knee pain for 2 weeks.  She feels that this is getting better overall.  She denies any swelling.  She does endorse some stiffness and some popping.  She is using Biofreeze and ibuprofen with relief.  Denies any numbness and tingling.   Review of Systems  Constitutional: Negative.   HENT: Negative.   Eyes: Negative.   Respiratory: Negative.   Cardiovascular: Negative.   Endocrine: Negative.   Musculoskeletal: Negative.   Neurological: Negative.   Hematological: Negative.   Psychiatric/Behavioral: Negative.   All other systems reviewed and are negative.    Objective: Vital Signs: There were no vitals taken for this visit.  Physical Exam    Constitutional: She is oriented to person, place, and time. She appears well-developed and well-nourished.  HENT:  Head: Normocephalic and atraumatic.  Eyes: EOM are normal.  Neck: Neck supple.  Pulmonary/Chest: Effort normal.  Abdominal: Soft.  Neurological: She is alert and oriented to person, place, and time.  Skin: Skin is warm. Capillary refill takes less than 2 seconds.  Psychiatric: She has a normal mood and affect. Her behavior is normal. Judgment and thought content normal.  Nursing note and vitals reviewed.   Ortho Exam Left knee exam shows no joint effusion.  Collaterals and cruciates are stable.  Preserved joint range of motion.  1+ patellofemoral crepitus. Specialty Comments:  No specialty comments available.  Imaging: Xr Knee 3 View Left  Result Date: 01/13/2018 No acute or structural abnormalities.  No degenerative joint disease    PMFS History: Patient Active Problem List   Diagnosis Date Noted  . HTN (hypertension) 08/29/2017  . Vitamin D deficiency 06/30/2017  . Lumbar degenerative disc disease 06/04/2017  . Chronic foot pain, left 06/04/2017  . Chronic pain of left ankle 06/04/2017  . Plantar fasciitis 06/04/2017  . Carpal tunnel syndrome on both sides 11/25/2016  . Allergic rhinitis 11/13/2015  . 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 Porokeratosis 12/16/2014  . Pain of left heel 12/05/2014  .  Severe obesity (BMI >= 40) (HCC) 12/15/2013  . Other dysphagia 12/15/2013  . Edema 07/22/2012  . Visual changes 07/22/2012  . Seasonal allergic rhinitis 12/16/2011  . Asthma 11/13/2011  . General medical examination 07/09/2011  . Exposure to hepatitis B 07/09/2011  . OSA (obstructive sleep apnea) 07/09/2011  . Back pain 07/09/2011   Past Medical History:  Diagnosis Date  . Allergy   . Asthma   . Bronchitis   . GERD (gastroesophageal reflux disease)   . Sleep apnea    uses c pap     Family History  Problem Relation Age of Onset  . Hypertension Father   .  Hypertension Sister   . Stroke Paternal Grandfather   . Hypertension Paternal Grandfather   . Hypertension Sister   . Colon cancer Neg Hx   . Colon polyps Neg Hx   . Esophageal cancer Neg Hx   . Rectal cancer Neg Hx   . Stomach cancer Neg Hx   . Breast cancer Neg Hx     Past Surgical History:  Procedure Laterality Date  . DENTAL SURGERY    . ECTOPIC PREGNANCY SURGERY  2006   Social History   Occupational History  . Not on file  Tobacco Use  . Smoking status: Never Smoker  . Smokeless tobacco: Never Used  Substance and Sexual Activity  . Alcohol use: Yes    Alcohol/week: 0.0 oz    Comment: rare  . Drug use: No  . Sexual activity: Yes

## 2018-01-14 ENCOUNTER — Ambulatory Visit: Payer: Self-pay | Admitting: Adult Health

## 2018-02-09 ENCOUNTER — Ambulatory Visit: Payer: Self-pay | Admitting: Family Medicine

## 2018-02-09 ENCOUNTER — Other Ambulatory Visit: Payer: Self-pay

## 2018-02-09 ENCOUNTER — Encounter: Payer: Self-pay | Admitting: Family Medicine

## 2018-02-09 VITALS — BP 132/81 | HR 90 | Temp 98.0°F | Resp 16 | Ht 65.0 in | Wt 291.4 lb

## 2018-02-09 DIAGNOSIS — G8929 Other chronic pain: Secondary | ICD-10-CM

## 2018-02-09 DIAGNOSIS — M5441 Lumbago with sciatica, right side: Secondary | ICD-10-CM

## 2018-02-09 MED ORDER — CYCLOBENZAPRINE HCL 10 MG PO TABS: 10.0000 mg | ORAL_TABLET | Freq: Three times a day (TID) | ORAL | 0 refills | Status: DC | PRN

## 2018-02-09 NOTE — Progress Notes (Signed)
   Subjective:    Patient ID: [name], female    DOB: [birth year], 52 y.o.   MRN: [id number]  HPI Back pain/spasm- R sided.  Started yesterday.  Pt has chronic back pain and is seeing Dr Jordan Likes for injxns.  It was recommended that she consider back surgery.  She has seen Dr Shon Baton in the past.  Pt reports the pain is 'real tight'.  Dr Jordan Likes would not provide muscle relaxers (which have helped in the past) b/c she was not planning to proceed w/ surgery.  Pt was doing a leg lift exercise yesterday when pain started.  Took Ibuprofen  yesterday w/o relief.  Asking for flexeril.     Review of Systems For ROS see HPI     Objective:   Physical Exam  Constitutional: She is oriented to person, place, and time. She appears well-developed and well-nourished. No distress.  obese  HENT:  Head: Normocephalic and atraumatic.  Cardiovascular: Intact distal pulses.  Musculoskeletal: She exhibits no tenderness (no TTP over R flank or paraspinal muscles).  Neurological: She is alert and oriented to person, place, and time. Coordination normal.  Skin: Skin is warm and dry.  Vitals reviewed.         Assessment & Plan:

## 2018-02-09 NOTE — Assessment & Plan Note (Signed)
Ongoing issue for pt.  She has seen Dr Shon Baton and Dr Jordan Likes for this issue in the past.  She is asking for Flexeril at this time- I offered to give this to her.  On the way out, she asked if I also thought she needed pain pills.  I told her that she is seeing Dr Jordan Likes (pain management) and she would need to go back to him if the Flexeril is not working.  I also informed her that since I am not the treating physician that I am not able to fill out her loan forgiveness form based on disability.

## 2018-02-09 NOTE — Patient Instructions (Signed)
Follow up as needed or as scheduled Start the Flexeril as needed- may cause drowsiness If the pain doesn't improve, please call Dr Jordan Likes as you may need more injections Continue to work on healthy diet and regular exercise- you can do it!! Call with any questions or concerns Hang in there!!

## 2018-02-18 ENCOUNTER — Ambulatory Visit: Payer: Self-pay | Admitting: Family Medicine

## 2018-02-24 ENCOUNTER — Telehealth: Payer: Self-pay | Admitting: Family Medicine

## 2018-02-24 NOTE — Telephone Encounter (Signed)
Pt dropped off forms "Continuing Disability Claim Form & Physical Capacities Evaluation". Requested that the forms be reviews and completed. Asked to be called upon completion. Placed in front bin with charge sheet.   Mentioned that if there is any confusion or questions about filling out the forms to give her a call.

## 2018-02-25 NOTE — Telephone Encounter (Signed)
Paperwork given to PCP for completion.  

## 2018-02-25 NOTE — Telephone Encounter (Signed)
As I previously explained to pt, I do not complete these forms as I am not treating her back pain

## 2018-02-25 NOTE — Telephone Encounter (Signed)
Spoke with pt and advised of PCP recommendations. PT stated that since she is refusing to have any more injections or having surgery that she was told by Dr. Jordan Likes and Dr. Shon Baton that she had to have her PCP complete.   Pt stated that she was advised by the social security office that these need to be completed for disability. Pt was advised that she should contact their office to see if they could set her up with a disability doctor.   Paperwork placed in tray in back she will be in to pick them up.

## 2018-02-25 NOTE — Telephone Encounter (Signed)
Agree w/ advice given.  She will need a disability doctor to complete these forms.

## 2018-03-16 ENCOUNTER — Telehealth: Payer: Self-pay | Admitting: General Practice

## 2018-03-16 DIAGNOSIS — M5441 Lumbago with sciatica, right side: Principal | ICD-10-CM

## 2018-03-16 DIAGNOSIS — G8929 Other chronic pain: Secondary | ICD-10-CM

## 2018-03-16 NOTE — Telephone Encounter (Signed)
Spoke with patient. She advised that Dr. Beverely Carrillo is aware that since she refused surgery Dr. Jordan Carrillo will not let her back in the office (states she may have fussed at him a bit).   Pt says that she is wanting to know if Dr. Beverely Carrillo would be willing to prescribe her pain meds for her chronic back issues or does she need a new pain management doctor? Pt is currently taking the flexeril Dr. Beverely Carrillo prescribed and ibuprofen 800mg  but they are not helping.

## 2018-03-16 NOTE — Telephone Encounter (Signed)
She will need a new pain management doctor to continue her pain pills.  We can place a referral

## 2018-03-17 NOTE — Telephone Encounter (Signed)
Called pt and advised of PCP recommendation for a pain management referral. Pt is ok with this. Referral placed today.

## 2018-03-17 NOTE — Addendum Note (Signed)
Addended by: Geannie RisenBRODMERKEL, Terrace Fontanilla L on: 03/17/2018 12:31 PM   Modules accepted: Orders

## 2018-04-15 ENCOUNTER — Telehealth: Payer: Self-pay | Admitting: Family Medicine

## 2018-04-15 NOTE — Telephone Encounter (Signed)
Spoke to pt regarding referral to pain med. That I was unable to get her scheduled (see referral notes) pt stated that was fine and to cancelled her appt with KT in Sept for her cpe. I repeated to her that she did want to cancel her cpe, pt stated yes that she has found another doctor.   Removing [facility] as pcp.

## 2018-07-02 ENCOUNTER — Encounter: Payer: Self-pay | Admitting: Family Medicine

## 2018-07-08 ENCOUNTER — Ambulatory Visit (HOSPITAL_COMMUNITY)
Admission: EM | Admit: 2018-07-08 | Discharge: 2018-07-08 | Disposition: A | Discharge status: Home / Self Care | Payer: Self-pay | Attending: Family Medicine | Admitting: Family Medicine

## 2018-07-08 ENCOUNTER — Encounter (HOSPITAL_COMMUNITY): Payer: Self-pay | Admitting: Emergency Medicine

## 2018-07-08 DIAGNOSIS — M5442 Lumbago with sciatica, left side: Secondary | ICD-10-CM

## 2018-07-08 MED ORDER — KETOROLAC TROMETHAMINE 60 MG/2ML IM SOLN
60.0000 mg | Freq: Once | INTRAMUSCULAR | Status: AC
  Administered 2018-07-08: 60 mg via INTRAMUSCULAR

## 2018-07-08 MED ORDER — KETOROLAC TROMETHAMINE 60 MG/2ML IM SOLN
INTRAMUSCULAR | Status: AC
  Filled 2018-07-08: qty 2

## 2018-07-08 MED ORDER — CYCLOBENZAPRINE HCL 10 MG PO TABS: 10.0000 mg | ORAL_TABLET | Freq: Two times a day (BID) | ORAL | 0 refills | Status: DC | PRN

## 2018-07-08 MED ORDER — PREDNISONE 50 MG PO TABS: 50.0000 mg | ORAL_TABLET | Freq: Every day | ORAL | 0 refills | Status: AC

## 2018-07-08 NOTE — Discharge Instructions (Signed)
We gave you a shot of Toradol today to help with your back pain Please begin taking prednisone daily for the next 5 days with food on your stomach, please try to avoid also using ibuprofen at the same time You may use flexeril as needed to help with pain. This is a muscle relaxer and causes sedation- please use only at bedtime or when you will be home and not have to drive/work  Please follow-up if back pain persisting, worsening, developing weakness in legs, changes/lack of control of your bowels or bladder, numbness between thighs, fever

## 2018-07-08 NOTE — ED Triage Notes (Signed)
Pt c/o lower left back pain since Friday, hx of sciatic nerve issues. Pt c/o pain with sitting, bending over.

## 2018-07-09 NOTE — ED Provider Notes (Signed)
[facility]    CSN: [id number] Arrival date & time: 07/08/18  1409     History   Chief Complaint Chief Complaint  Patient presents with  . Back Pain    HPI [name] is a 52 y.o. female history of hypertension, degenerative disc disease in lumbar region, OSA, presenting today for evaluation of back pain.  Patient states that beginning Friday, approximately 5 days ago she developed back pain that radiates down her left side.  States that she has a history of sciatica especially on her right side, but because of that she often compensates by using her left side more.  On Friday she was sitting and believes she reached for a pot and this may have triggered her symptoms.  Since she has had pain that radiates into her left leg along with radiation of numbness.  She denies numbness between thighs/saddle anesthesia, denies change in bowel or bladder habits.  She is been using hydrocodone ibuprofen with mild relief.  In the past she has previously received spinal steroid injections which have helped.  She has not followed up with her spine doctor in a while as he is no longer practicing.    HPI  Past Medical History:  Diagnosis Date  . Allergy   . Asthma   . Bronchitis   . GERD (gastroesophageal reflux disease)   . Sleep apnea    uses c pap     Patient Active Problem List   Diagnosis Date Noted  . HTN (hypertension) 08/29/2017  . Vitamin D deficiency 06/30/2017  . Lumbar degenerative disc disease 06/04/2017  . Chronic foot pain, left 06/04/2017  . Chronic pain of left ankle 06/04/2017  . Plantar fasciitis 06/04/2017  . Carpal tunnel syndrome on both sides 11/25/2016  . Allergic rhinitis 11/13/2015  . 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 Porokeratosis 12/16/2014  . Pain of left heel 12/05/2014  . Severe obesity (BMI >= 40) (HCC) 12/15/2013  . Other dysphagia 12/15/2013  . Edema 07/22/2012  . Visual changes 07/22/2012  . Seasonal allergic rhinitis 12/16/2011  . Asthma  11/13/2011  . General medical examination 07/09/2011  . Exposure to hepatitis B 07/09/2011  . OSA (obstructive sleep apnea) 07/09/2011  . Back pain 07/09/2011    Past Surgical History:  Procedure Laterality Date  . DENTAL SURGERY    . ECTOPIC PREGNANCY SURGERY  2006    OB History   None      Home Medications    Prior to Admission medications   Medication Sig Start Date End Date Taking? Authorizing Provider  amLODipine (NORVASC) 10 MG tablet Take 10 mg by mouth daily.   Yes [provider]  atenolol (TENORMIN) 100 MG tablet Take 100 mg by mouth daily.   Yes [provider]  albuterol (PROAIR HFA) 108 (90 Base) MCG/ACT inhaler Inhale 2 puffs into the lungs every 4 (four) hours as needed for wheezing or shortness of breath. 07/16/17   Oretha Milch, MD  albuterol (PROVENTIL) (2.5 MG/3ML) 0.083% nebulizer solution Take 3 mLs (2.5 mg total) by nebulization every 6 (six) hours as needed for wheezing. 05/24/15 11/30/19  Sheliah Hatch, MD  budesonide-formoterol (SYMBICORT) 80-4.5 MCG/ACT inhaler TAKE 2 PUFFS BY MOUTH TWICE A DAY 01/07/18   Parrett, Virgel Bouquet, NP  cyclobenzaprine (FLEXERIL) 10 MG tablet Take 1 tablet (10 mg total) by mouth 2 (two) times daily as needed for muscle spasms. 07/08/18   Amier Hoyt C, PA-C  hydrochlorothiazide (HYDRODIURIL) 12.5 MG tablet TAKE 1 TABLET (12.5  MG TOTAL) DAILY BY MOUTH. 12/22/17   Sheliah Hatch, MD  ibuprofen (ADVIL,MOTRIN) 800 MG tablet Take 1 tablet (800 mg total) by mouth every 8 (eight) hours as needed. 01/13/18   Tarry Kos, MD  montelukast (SINGULAIR) 10 MG tablet TAKE 1 TABLET BY MOUTH EVERYDAY AT BEDTIME 09/19/17   Oretha Milch, MD  nabumetone (RELAFEN) 500 MG tablet Take 500 mg 2 (two) times daily as needed by mouth. 08/19/17   [provider]  Nebulizers (COMPRESSOR/NEBULIZER) MISC As directed 09/20/11   Zola Button, Grayling Congress, DO  predniSONE (DELTASONE) 50 MG tablet Take 1 tablet (50 mg total) by mouth  daily for 5 days. With food 07/08/18 07/13/18  Cristi Gwynn, Junius Creamer, PA-C    Family History Family History  Problem Relation Age of Onset  . Hypertension Father   . Hypertension Sister   . Stroke Paternal Grandfather   . Hypertension Paternal Grandfather   . Hypertension Sister   . Colon cancer Neg Hx   . Colon polyps Neg Hx   . Esophageal cancer Neg Hx   . Rectal cancer Neg Hx   . Stomach cancer Neg Hx   . Breast cancer Neg Hx     Social History Social History   Tobacco Use  . Smoking status: Never Smoker  . Smokeless tobacco: Never Used  Substance Use Topics  . Alcohol use: Yes    Alcohol/week: 0.0 standard drinks    Comment: rare  . Drug use: No     Allergies   Patient has no known allergies.   Review of Systems Review of Systems  Constitutional: Negative for fatigue and fever.  Eyes: Negative for visual disturbance.  Respiratory: Negative for shortness of breath.   Cardiovascular: Negative for chest pain.  Gastrointestinal: Negative for abdominal pain, nausea and vomiting.  Genitourinary: Negative for decreased urine volume and difficulty urinating.  Musculoskeletal: Positive for back pain, gait problem and myalgias. Negative for arthralgias and joint swelling.  Skin: Positive for color change and rash. Negative for wound.  Neurological: Negative for dizziness, weakness, light-headedness, numbness and headaches.     Physical Exam Triage Vital Signs ED Triage Vitals  Enc Vitals Group     BP 07/08/18 1451 (!) 119/55     Pulse Rate 07/08/18 1451 90     Resp 07/08/18 1451 18     Temp 07/08/18 1451 98.4 F (36.9 C)     Temp src --      SpO2 07/08/18 1451 99 %     Weight --      Height --      Head Circumference --      Peak Flow --      Pain Score 07/08/18 1608 8     Pain Loc --      Pain Edu? --      Excl. in GC? --    No data found.  Updated Vital Signs BP (!) 119/55   Pulse 90   Temp 98.4 F (36.9 C)   Resp 18   SpO2 99%   Visual  Acuity Right Eye Distance:   Left Eye Distance:   Bilateral Distance:    Right Eye Near:   Left Eye Near:    Bilateral Near:     Physical Exam  Constitutional: She appears well-developed and well-nourished. No distress.  HENT:  Head: Normocephalic and atraumatic.  Eyes: Pupils are equal, round, and reactive to light. Conjunctivae and EOM are normal.  Neck: Neck supple.  Cardiovascular:  Normal rate and regular rhythm.  No murmur heard. Pulmonary/Chest: Effort normal and breath sounds normal. No respiratory distress.  Abdominal: Soft. There is no tenderness.  Musculoskeletal: She exhibits no edema.  Nontender to palpation throughout thoracic, lumbar spine and bilateral lumbar musculature.  Positive straight leg raise on left, strength 5/5 and equal bilaterally in all directions at hips and knees, patellar reflexes 2+ bilaterally.  Able to ambulate from chair to exam table without assistance  Neurological: She is alert.  Skin: Skin is warm and dry.  Psychiatric: She has a normal mood and affect.  Nursing note and vitals reviewed.    UC Treatments / Results  Labs (all labs ordered are listed, but only abnormal results are displayed) Labs Reviewed - No data to display  EKG None  Radiology No results found.  Procedures Procedures (including critical care time)  Medications Ordered in UC Medications  ketorolac (TORADOL) injection 60 mg (60 mg Intramuscular Given 07/08/18 1603)    Initial Impression / Assessment and Plan / UC Course  I have reviewed the triage vital signs and the nursing notes.  Pertinent labs & imaging results that were available during my care of the patient were reviewed by me and considered in my medical decision making (see chart for details).     Patient appears to have low back pain with radicular distribution, and no red flags for cauda equina.  Strength intact.  Will provide Toradol in clinic today, will send home with prednisone for 5 days as  well as Flexeril.  Discussed activity modification, but avoiding bedrest.  Ice and heat.Discussed strict return precautions. Patient verbalized understanding and is agreeable with plan.  Final Clinical Impressions(s) / UC Diagnoses   Final diagnoses:  Acute left-sided low back pain with left-sided sciatica     Discharge Instructions     We gave you a shot of Toradol today to help with your back pain Please begin taking prednisone daily for the next 5 days with food on your stomach, please try to avoid also using ibuprofen at the same time You may use flexeril as needed to help with pain. This is a muscle relaxer and causes sedation- please use only at bedtime or when you will be home and not have to drive/work  Please follow-up if back pain persisting, worsening, developing weakness in legs, changes/lack of control of your bowels or bladder, numbness between thighs, fever   ED Prescriptions    Medication Sig Dispense Auth. Provider   predniSONE (DELTASONE) 50 MG tablet Take 1 tablet (50 mg total) by mouth daily for 5 days. With food 5 tablet Anwen Cannedy C, PA-C   cyclobenzaprine (FLEXERIL) 10 MG tablet Take 1 tablet (10 mg total) by mouth 2 (two) times daily as needed for muscle spasms. 20 tablet Coralee Edberg, [place] C, PA-C     Controlled Substance Prescriptions [place] Controlled Substance Registry consulted? Not Applicable   Lew Dawes, [place] 07/09/18 [phone]

## 2018-12-09 ENCOUNTER — Telehealth: Payer: Self-pay | Admitting: Internal Medicine

## 2018-12-09 NOTE — Telephone Encounter (Signed)
I have placed forms from [facility] group in the bin upfront, pt has a different pcp listed but saw Tabori on 02/09/18

## 2018-12-10 NOTE — Telephone Encounter (Signed)
Looking in chart pt has not seen in over a year at minimum would need an appt with Tabori. Per Dr. [name] need to verify who pt is currently seeing as PCP.   Papers placed in bin in back.

## 2018-12-10 NOTE — Telephone Encounter (Signed)
Received paperwork from back, called and LMOVM to verify pcp and if paperwork needs to go to another office. Placed paperwork in file cabinet up front.

## 2019-01-20 ENCOUNTER — Telehealth: Payer: Self-pay | Admitting: Adult Health

## 2019-01-21 NOTE — Telephone Encounter (Signed)
Rec'd completed forms - fwd to [employer] via interoffice mail -pr  °

## 2019-05-26 ENCOUNTER — Encounter: Payer: Self-pay | Admitting: Adult Health

## 2019-05-27 ENCOUNTER — Encounter: Payer: Self-pay | Admitting: Adult Health

## 2019-05-27 ENCOUNTER — Other Ambulatory Visit: Payer: Self-pay

## 2019-05-27 ENCOUNTER — Ambulatory Visit (INDEPENDENT_AMBULATORY_CARE_PROVIDER_SITE_OTHER): Payer: Self-pay | Admitting: Adult Health

## 2019-05-27 DIAGNOSIS — G4733 Obstructive sleep apnea (adult) (pediatric): Secondary | ICD-10-CM

## 2019-05-27 DIAGNOSIS — J309 Allergic rhinitis, unspecified: Secondary | ICD-10-CM

## 2019-05-27 DIAGNOSIS — J4530 Mild persistent asthma, uncomplicated: Secondary | ICD-10-CM

## 2019-05-27 NOTE — Assessment & Plan Note (Signed)
Excellent control and compliance on CPAP  Adjust CPAP pressure 5-15 for comfort   Plan  Patient Instructions   Continue on Symbicort 2 puffs twice daily, rinse after use Continue on on Singulair daily Continue on CPAP At bedtime   Decrease CPAP pressure to 5 to 15cmH2O.  Do not drive if sleepy  Work on healthy weight .   Follow up with Dr. Elsworth Soho  In 1 year and As needed

## 2019-05-27 NOTE — Assessment & Plan Note (Signed)
Cont on current regimen   Plan  Patient Instructions   Continue on Symbicort 2 puffs twice daily, rinse after use Continue on on Singulair daily Continue on CPAP At bedtime   Decrease CPAP pressure to 5 to 15cmH2O.  Do not drive if sleepy  Work on healthy weight .   Follow up with Dr. Elsworth Soho  In 1 year and As needed    '

## 2019-05-27 NOTE — Assessment & Plan Note (Signed)
Well controlled on current regimen   Plan  Patient Instructions   Continue on Symbicort 2 puffs twice daily, rinse after use Continue on on Singulair daily Continue on CPAP At bedtime   Decrease CPAP pressure to 5 to 15cmH2O.  Do not drive if sleepy  Work on healthy weight .   Follow up with Dr. Elsworth Soho  In 1 year and As needed

## 2019-05-27 NOTE — Patient Instructions (Addendum)
Continue on Symbicort 2 puffs twice daily, rinse after use Continue on on Singulair daily Continue on CPAP At bedtime   Decrease CPAP pressure to 5 to 15cmH2O.  Do not drive if sleepy  Work on healthy weight .   Follow up with Dr. Elsworth Soho  In 1 year and As needed

## 2019-05-27 NOTE — Progress Notes (Signed)
@Patient  ID: [name], female    DOB: 02/27/1966, 53 y.o.   MRN: [id number]  Chief Complaint  Patient presents with  . Follow-up    OSA     Referring provider: Antonietta Jewel, MD  HPI: 53 year old female never smoker followed for asthma and obstructive sleep apnea  TEST/EVENTS :  01/29/2012 Spirometry showed no airway obstruction, FEV1 was 82% FVC was 79% ratio 79  PSG 2013 moderate obstructive sleep apnea - AHI 18/h   05/27/2019 Follow up : Asthma , OSA  patient presents for a follow-up for asthma and obstructive sleep apnea.  She was last seen March 2019.  She says overall breathing is doing well.  Remains on Symbicort twice daily.  She denies any increased wheezing or cough.  No increased albuterol use.  Activity tolerance has remained at her baseline. Says she had a asthma flare in March , treated at PCP with steroids which resolved. Since then has been doing well.   Patient has underlying obstructive sleep apnea.  She is on CPAP at bedtime.  Download shows excellent compliance with 100% usage.  Daily average usage around 5.5 hours.  Patient is on auto CPAP 5 to 20 cm H2O.  AHI 3.0.  Daily average pressure at 10 cm H2O Says pressure a little high at times.   No Known Allergies  Immunization History  Administered Date(s) Administered  . Influenza Split 07/09/2011  . Influenza Whole 07/31/2012  . Influenza,inj,Quad PF,6+ Mos 07/01/2013, 07/29/2014, 08/25/2015, 06/14/2016, 06/30/2017  . Tdap 07/09/2011    Past Medical History:  Diagnosis Date  . Allergy   . Asthma   . Bronchitis   . GERD (gastroesophageal reflux disease)   . Sleep apnea    uses c pap     Tobacco History: Social History   Tobacco Use  Smoking Status Never Smoker  Smokeless Tobacco Never Used   Counseling given: Not Answered   Outpatient Medications Prior to Visit  Medication Sig Dispense Refill  . albuterol (PROAIR HFA) 108 (90 Base) MCG/ACT inhaler Inhale 2 puffs into the lungs every 4  (four) hours as needed for wheezing or shortness of breath. 8 g 6  . albuterol (PROVENTIL) (2.5 MG/3ML) 0.083% nebulizer solution Take 3 mLs (2.5 mg total) by nebulization every 6 (six) hours as needed for wheezing. 75 mL 12  . amLODipine (NORVASC) 10 MG tablet Take 10 mg by mouth daily.    Marland Kitchen atenolol (TENORMIN) 100 MG tablet Take 100 mg by mouth daily.    . budesonide-formoterol (SYMBICORT) 80-4.5 MCG/ACT inhaler TAKE 2 PUFFS BY MOUTH TWICE A DAY 10.2 Inhaler 5  . cyclobenzaprine (FLEXERIL) 10 MG tablet Take 1 tablet (10 mg total) by mouth 2 (two) times daily as needed for muscle spasms. 20 tablet 0  . hydrochlorothiazide (HYDRODIURIL) 12.5 MG tablet TAKE 1 TABLET (12.5 MG TOTAL) DAILY BY MOUTH. (Patient taking differently: Take 25 mg by mouth daily. ) 30 tablet 3  . HYDROcodone-acetaminophen (NORCO/VICODIN) 5-325 MG tablet Take 1 tablet by mouth 2 (two) times daily as needed for moderate pain.    Marland Kitchen ibuprofen (ADVIL,MOTRIN) 800 MG tablet Take 1 tablet (800 mg total) by mouth every 8 (eight) hours as needed. 30 tablet 2  . montelukast (SINGULAIR) 10 MG tablet TAKE 1 TABLET BY MOUTH EVERYDAY AT BEDTIME 30 tablet 3  . nabumetone (RELAFEN) 500 MG tablet Take 500 mg 2 (two) times daily as needed by mouth.  0  . Nebulizers (COMPRESSOR/NEBULIZER) MISC As directed 1 each 0  No facility-administered medications prior to visit.      Review of Systems:   Constitutional:   No  weight loss, night sweats,  Fevers, chills, fatigue, or  lassitude.  HEENT:   No headaches,  Difficulty swallowing,  Tooth/dental problems, or  Sore throat,                No sneezing, itching, ear ache, nasal congestion, post nasal drip,   CV:  No chest pain,  Orthopnea, PND, swelling in lower extremities, anasarca, dizziness, palpitations, syncope.   GI  No heartburn, indigestion, abdominal pain, nausea, vomiting, diarrhea, change in bowel habits, loss of appetite, bloody stools.   Resp: No shortness of breath with exertion  or at rest.  No excess mucus, no productive cough,  No non-productive cough,  No coughing up of blood.  No change in color of mucus.  No wheezing.  No chest wall deformity  Skin: no rash or lesions.  GU: no dysuria, change in color of urine, no urgency or frequency.  No flank pain, no hematuria   MS:  No joint pain or swelling.  No decreased range of motion.  No back pain.    Physical Exam  BP 118/68 (BP Location: Left Arm, Cuff Size: Large)   Pulse 68   Temp 98.5 F (36.9 C) (Oral)   Ht 5\' 6"  (1.676 m)   Wt 291 lb 6.4 oz (132.2 kg)   SpO2 98%   BMI 47.03 kg/m   GEN: A/Ox3; pleasant , NAD, obese    HEENT:  [place]/AT,    NOSE-clear, THROAT-clear, no lesions, no postnasal drip or exudate noted. Class 2-3 MP airway   NECK:  Supple w/ fair ROM; no JVD; normal carotid impulses w/o bruits; no thyromegaly or nodules palpated; no lymphadenopathy.    RESP  Clear  P & A; w/o, wheezes/ rales/ or rhonchi. no accessory muscle use, no dullness to percussion  CARD:  RRR, no m/r/g, no peripheral edema, pulses intact, no cyanosis or clubbing.  GI:   Soft & nt; nml bowel sounds; no organomegaly or masses detected.   Musco: Warm bil, no deformities or joint swelling noted.   Neuro: alert, no focal deficits noted.    Skin: Warm, no lesions or rashes    Lab Results:  CBC  BNP No results found for: BNP  ProBNP No results found for: PROBNP  Imaging: No results found.    No flowsheet data found.  No results found for: NITRICOXIDE      Assessment & Plan:   Asthma Well controlled on current regimen   Plan  Patient Instructions   Continue on Symbicort 2 puffs twice daily, rinse after use Continue on on Singulair daily Continue on CPAP At bedtime   Decrease CPAP pressure to 5 to 15cmH2O.  Do not drive if sleepy  Work on healthy weight .   Follow up with Dr. Vassie LollAlva  In 1 year and As needed       OSA (obstructive sleep apnea) Excellent control and compliance on CPAP   Adjust CPAP pressure 5-15 for comfort   Plan  Patient Instructions   Continue on Symbicort 2 puffs twice daily, rinse after use Continue on on Singulair daily Continue on CPAP At bedtime   Decrease CPAP pressure to 5 to 15cmH2O.  Do not drive if sleepy  Work on healthy weight .   Follow up with Dr. Vassie LollAlva  In 1 year and As needed      Allergic rhinitis Cont  on current regimen   Plan  Patient Instructions   Continue on Symbicort 2 puffs twice daily, rinse after use Continue on on Singulair daily Continue on CPAP At bedtime   Decrease CPAP pressure to 5 to 15cmH2O.  Do not drive if sleepy  Work on healthy weight .   Follow up with Dr. Vassie LollAlva  In 1 year and As needed    '      Tammy Parrett, NP 05/27/2019

## 2019-05-27 NOTE — Addendum Note (Signed)
Addended by: Parke Poisson E on: 05/27/2019 02:39 PM   Modules accepted: Orders

## 2019-06-17 ENCOUNTER — Other Ambulatory Visit: Payer: Self-pay | Admitting: Adult Health

## 2019-08-30 ENCOUNTER — Telehealth: Payer: Self-pay | Admitting: Pulmonary Disease

## 2019-08-30 MED ORDER — BUDESONIDE-FORMOTEROL FUMARATE 80-4.5 MCG/ACT IN AERO: INHALATION_SPRAY | RESPIRATORY_TRACT | 5 refills | Status: DC

## 2019-08-30 NOTE — Telephone Encounter (Signed)
Received faxed refill request from [facility] for new Rx for patient's Symbicort 80  Last office 8.13.2020 with Parrett NP, recs to follow up in 1 year  Refills sent

## 2019-10-20 ENCOUNTER — Telehealth: Payer: Self-pay | Admitting: Internal Medicine

## 2019-10-20 NOTE — Telephone Encounter (Signed)
I was not aware pt had left.  Please clarify who she has been seeing as we would need records to re-establish care

## 2019-10-20 NOTE — Telephone Encounter (Signed)
Pt called in asking if [name] would take her back as a pt, please advise

## 2019-10-20 NOTE — Telephone Encounter (Signed)
Please advise 

## 2019-10-20 NOTE — Telephone Encounter (Signed)
FYI

## 2019-10-20 NOTE — Telephone Encounter (Signed)
Sami Roseanne Reno MD is listed as her pcp in the system.

## 2019-10-20 NOTE — Telephone Encounter (Signed)
Ok to re-establish 

## 2019-10-28 ENCOUNTER — Encounter: Payer: Self-pay | Admitting: Family Medicine

## 2019-10-28 ENCOUNTER — Other Ambulatory Visit: Payer: Self-pay

## 2019-10-28 DIAGNOSIS — Z0289 Encounter for other administrative examinations: Secondary | ICD-10-CM

## 2019-10-28 NOTE — Progress Notes (Signed)
  This encounter was created in error - please disregard.  Pt decided to cancel appt w/o being seen

## 2019-10-28 NOTE — Progress Notes (Signed)
I have discussed the procedure for the virtual visit with the patient who has given consent to proceed with assessment and treatment.   Spoke with pt to begin appt. She advised that she was told by her attorney she needed disability assessment paperwork filled out. When I advised patient that Dr. [name] does not complete disability assessment forms. Pt asked for appt to be canceled. Would call back to schedule another appt.   Geannie Risen, CMA

## 2020-01-17 ENCOUNTER — Encounter: Payer: Self-pay | Admitting: Emergency Medicine

## 2020-01-17 ENCOUNTER — Ambulatory Visit
Admission: EM | Admit: 2020-01-17 | Discharge: 2020-01-17 | Disposition: A | Discharge status: Home / Self Care | Payer: Medicare Other

## 2020-01-17 ENCOUNTER — Other Ambulatory Visit: Payer: Self-pay

## 2020-01-17 DIAGNOSIS — R600 Localized edema: Secondary | ICD-10-CM | POA: Diagnosis not present

## 2020-01-17 MED ORDER — FUROSEMIDE 20 MG PO TABS: 20.0000 mg | ORAL_TABLET | Freq: Two times a day (BID) | ORAL | 0 refills | Status: DC | PRN

## 2020-01-17 NOTE — ED Provider Notes (Signed)
[facility]    CSN: [id number] Arrival date & time: 01/17/20  1314      History   Chief Complaint Chief Complaint  Patient presents with  . Edema    HPI [name] is a 54 y.o. female with history of hypertension, obesity, asthma presenting for bilateral ankle swelling X 3 weeks.  States swelling is gone in the morning, though increases with dependency.  Patient resting history of bilateral knee OA, left plantar fasciitis.  Patient denies worsening foot pain, knee pain from baseline, increased activity level, injury or trauma.  Patient does note that she took prednisone a few weeks ago for asthma exacerbation.  States she was seen by her PCP for this: Given prednisone, antibiotics.  States she only took 3 tabs of the initial 6 tabs dose.  Did not take antibiotics, cannot recall the name.  Patient states she has follow-up with her PCP soon.  Denies chest pain, difficulty breathing.  Past Medical History:  Diagnosis Date  . Allergy   . Asthma   . Bronchitis   . GERD (gastroesophageal reflux disease)   . Sleep apnea    uses c pap     Patient Active Problem List   Diagnosis Date Noted  . HTN (hypertension) 08/29/2017  . Vitamin D deficiency 06/30/2017  . Lumbar degenerative disc disease 06/04/2017  . Chronic foot pain, left 06/04/2017  . Chronic pain of left ankle 06/04/2017  . Plantar fasciitis 06/04/2017  . Carpal tunnel syndrome on both sides 11/25/2016  . Allergic rhinitis 11/13/2015  . 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 Porokeratosis 12/16/2014  . Pain of left heel 12/05/2014  . Severe obesity (BMI >= 40) (HCC) 12/15/2013  . Other dysphagia 12/15/2013  . Edema 07/22/2012  . Visual changes 07/22/2012  . Seasonal allergic rhinitis 12/16/2011  . Asthma 11/13/2011  . General medical examination 07/09/2011  . Exposure to hepatitis B 07/09/2011  . OSA (obstructive sleep apnea) 07/09/2011  . Back pain 07/09/2011    Past Surgical History:  Procedure Laterality  Date  . DENTAL SURGERY    . ECTOPIC PREGNANCY SURGERY  2006    OB History   No obstetric history on file.      Home Medications    Prior to Admission medications   Medication Sig Start Date End Date Taking? Authorizing Provider  amLODipine (NORVASC) 10 MG tablet Take 10 mg by mouth daily.   Yes [provider]  atenolol (TENORMIN) 100 MG tablet Take 100 mg by mouth daily.   Yes [provider]  budesonide-formoterol (SYMBICORT) 80-4.5 MCG/ACT inhaler TAKE 2 PUFFS BY MOUTH TWICE A DAY 08/30/19  Yes Oretha Milch, MD  cyclobenzaprine (FLEXERIL) 10 MG tablet Take 1 tablet (10 mg total) by mouth 2 (two) times daily as needed for muscle spasms. 07/08/18  Yes Wieters, Hallie C, PA-C  hydrochlorothiazide (HYDRODIURIL) 12.5 MG tablet TAKE 1 TABLET (12.5 MG TOTAL) DAILY BY MOUTH. Patient taking differently: Take 25 mg by mouth daily.  12/22/17  Yes Sheliah Hatch, MD  HYDROcodone-acetaminophen (NORCO/VICODIN) 5-325 MG tablet Take 1 tablet by mouth 2 (two) times daily as needed for moderate pain.   Yes [provider]  ibuprofen (ADVIL,MOTRIN) 800 MG tablet Take 1 tablet (800 mg total) by mouth every 8 (eight) hours as needed. 01/13/18  Yes Tarry Kos, MD  montelukast (SINGULAIR) 10 MG tablet TAKE 1 TABLET BY MOUTH EVERYDAY AT BEDTIME 09/19/17  Yes Oretha Milch, MD  NON FORMULARY Antibiotic and prednisone for bronchitis  Yes [provider]  PHENTERMINE HCL PO Take by mouth.   Yes [provider]  albuterol (PROVENTIL) (2.5 MG/3ML) 0.083% nebulizer solution Take 3 mLs (2.5 mg total) by nebulization every 6 (six) hours as needed for wheezing. 05/24/15 11/30/19  Midge Minium, MD  furosemide (LASIX) 20 MG tablet Take 1 tablet (20 mg total) by mouth 2 (two) times daily as needed for up to 3 days. 01/17/20 01/20/20  Hall-Potvin, [place], PA-C  nabumetone (RELAFEN) 500 MG tablet Take 500 mg 2 (two) times daily as needed by mouth. 08/19/17   [provider]  Nebulizers (COMPRESSOR/NEBULIZER) MISC As directed 09/20/11   Carollee Herter, Alferd Apa, DO  PROAIR HFA 108 [phone] Base) MCG/ACT inhaler INHALE 2 PUFFS INTO THE LUNGS EVERY 4 HOURS AS NEEDED FOR WHEEZING OR SHORTNESS OF BREATH. 06/18/19   Parrett, Fonnie Mu, NP    Family History Family History  Problem Relation Age of Onset  . Hypertension Father   . Hypertension Sister   . Stroke Paternal Grandfather   . Hypertension Paternal Grandfather   . Hypertension Sister   . Colon cancer Neg Hx   . Colon polyps Neg Hx   . Esophageal cancer Neg Hx   . Rectal cancer Neg Hx   . Stomach cancer Neg Hx   . Breast cancer Neg Hx     Social History Social History   Tobacco Use  . Smoking status: Never Smoker  . Smokeless tobacco: Never Used  Substance Use Topics  . Alcohol use: Yes    Alcohol/week: 0.0 standard drinks    Comment: rare  . Drug use: No     Allergies   Patient has no known allergies.   Review of Systems As per HPI   Physical Exam Triage Vital Signs ED Triage Vitals  Enc Vitals Group     BP      Pulse      Resp      Temp      Temp src      SpO2      Weight      Height      Head Circumference      Peak Flow      Pain Score      Pain Loc      Pain Edu?      Excl. in [place]?    No data found.  Updated Vital Signs BP 110/77 (BP Location: Left Arm) Comment (BP Location): large cuff  Pulse 89   Temp 98.4 F (36.9 C) (Oral)   Resp (!) 24   SpO2 98%   Visual Acuity Right Eye Distance:   Left Eye Distance:   Bilateral Distance:    Right Eye Near:   Left Eye Near:    Bilateral Near:     Physical Exam Constitutional:      General: She is not in acute distress. HENT:     Head: Normocephalic and atraumatic.  Eyes:     General: No scleral icterus.    Pupils: Pupils are equal, round, and reactive to light.  Cardiovascular:     Rate and Rhythm: Normal rate.  Pulmonary:     Effort: Pulmonary effort is normal.  Skin:    Coloration: Skin is not  jaundiced or pale.  Neurological:     Mental Status: She is alert and oriented to person, place, and time.      UC Treatments / Results  Labs (all labs ordered are listed, but only abnormal  results are displayed) Labs Reviewed - No data to display  EKG   Radiology No results found.  Procedures Procedures (including critical care time)  Medications Ordered in UC Medications - No data to display  Initial Impression / Assessment and Plan / UC Course  I have reviewed the triage vital signs and the nursing notes.  Pertinent labs & imaging results that were available during my care of the patient were reviewed by me and considered in my medical decision making (see chart for details).     Patient febrile, nontoxic and hemodynamically stable in office.  SPO2 98%.  Given reassuring vitals, lack of chest pain or difficulty breathing, and patient's habitus radiography deferred.  Advised patient to finish medications as prescribed by her primary care and follow-up with them regarding asthma exacerbation despite having improved breathing.  Will provide short course of Lasix, and wear compression stockings for lower extremity edema.  Patient to schedule follow-up with PCP for reevaluation if needed.  Return precautions discussed, patient verbalized understanding and is agreeable to plan. Final Clinical Impressions(s) / UC Diagnoses   Final diagnoses:  Bilateral lower extremity edema     Discharge Instructions     Take lasix (fluid pill) as directed. Wear compression stocking/sock or "ted hose" to provide compression. Follow up w/ PCP for further evaluation.    ED Prescriptions    Medication Sig Dispense Auth. Provider   furosemide (LASIX) 20 MG tablet Take 1 tablet (20 mg total) by mouth 2 (two) times daily as needed for up to 3 days. 6 tablet Hall-Potvin, [place], PA-C     PDMP not reviewed this encounter.   Hall-Potvin, [place], [place] 01/18/20 1948

## 2020-01-17 NOTE — ED Triage Notes (Signed)
bilateral feet, ankle swelling for 3 weeks.  Swelling is gone in the morning, then it builds up after standing up and moving around.

## 2020-01-17 NOTE — Discharge Instructions (Addendum)
Take lasix (fluid pill) as directed. Wear compression stocking/sock or "ted hose" to provide compression. Follow up w/ PCP for further evaluation.

## 2020-01-18 ENCOUNTER — Encounter: Payer: Self-pay | Admitting: Emergency Medicine

## 2020-03-09 ENCOUNTER — Other Ambulatory Visit: Payer: Self-pay | Admitting: Internal Medicine

## 2020-03-09 DIAGNOSIS — Z1231 Encounter for screening mammogram for malignant neoplasm of breast: Secondary | ICD-10-CM

## 2020-05-31 ENCOUNTER — Ambulatory Visit
Admission: RE | Admit: 2020-05-31 | Discharge: 2020-05-31 | Disposition: A | Discharge status: Home / Self Care | Payer: Medicare Other | Source: Ambulatory Visit | Attending: Internal Medicine | Admitting: Internal Medicine

## 2020-05-31 ENCOUNTER — Other Ambulatory Visit: Payer: Self-pay

## 2020-05-31 DIAGNOSIS — Z1231 Encounter for screening mammogram for malignant neoplasm of breast: Secondary | ICD-10-CM

## 2020-06-06 ENCOUNTER — Ambulatory Visit (INDEPENDENT_AMBULATORY_CARE_PROVIDER_SITE_OTHER): Payer: Medicare Other | Admitting: Pulmonary Disease

## 2020-06-06 ENCOUNTER — Encounter: Payer: Self-pay | Admitting: Pulmonary Disease

## 2020-06-06 ENCOUNTER — Other Ambulatory Visit: Payer: Self-pay

## 2020-06-06 ENCOUNTER — Other Ambulatory Visit: Payer: Self-pay | Admitting: Pulmonary Disease

## 2020-06-06 DIAGNOSIS — G4733 Obstructive sleep apnea (adult) (pediatric): Secondary | ICD-10-CM

## 2020-06-06 DIAGNOSIS — R062 Wheezing: Secondary | ICD-10-CM

## 2020-06-06 DIAGNOSIS — J4530 Mild persistent asthma, uncomplicated: Secondary | ICD-10-CM | POA: Diagnosis not present

## 2020-06-06 MED ORDER — BREO ELLIPTA 100-25 MCG/INH IN AEPB: 1.0000 | INHALATION_SPRAY | Freq: Every day | RESPIRATORY_TRACT | 0 refills | Status: DC

## 2020-06-06 MED ORDER — ALBUTEROL SULFATE (2.5 MG/3ML) 0.083% IN NEBU: 2.5000 mg | INHALATION_SOLUTION | Freq: Four times a day (QID) | RESPIRATORY_TRACT | 12 refills | Status: DC | PRN

## 2020-06-06 MED ORDER — BUDESONIDE-FORMOTEROL FUMARATE 160-4.5 MCG/ACT IN AERO: 2.0000 | INHALATION_SPRAY | Freq: Two times a day (BID) | RESPIRATORY_TRACT | 6 refills | Status: DC

## 2020-06-06 NOTE — Assessment & Plan Note (Signed)
Prescription for new CPAP 11 cm will be sent to DME  Weight loss encouraged, compliance with goal of at least 4-6 hrs every night is the expectation. Advised against medications with sedative side effects Cautioned against driving when sleepy - understanding that sleepiness will vary on a day to day basis

## 2020-06-06 NOTE — Addendum Note (Signed)
Addended by: Luna Kitchens D on: 06/06/2020 11:32 AM   Modules accepted: Orders

## 2020-06-06 NOTE — Patient Instructions (Signed)
  Sample of Breo 100 once daily -until you can get Symbicort prescription filled Still on Singulair.  Refills on albuterol nebs.  Prescription for new CPAP 11 cm will be sent to DME

## 2020-06-06 NOTE — Addendum Note (Signed)
Addended by: Luna Kitchens D on: 06/06/2020 11:17 AM   Modules accepted: Orders

## 2020-06-06 NOTE — Assessment & Plan Note (Signed)
She does not have the Medicare prescription plan yet and has been unable to obtain Symbicort Her asthma has been generally worse in the spring and fall  Sample of Breo 100 once daily -until you can get Symbicort prescription filled Stay on Singulair.  Refills on albuterol nebs.

## 2020-06-06 NOTE — Progress Notes (Signed)
   Subjective:    Patient ID: [name], female    DOB: 23-Oct-1965, 54 y.o.   MRN: [id number]  HPI  54 yo never smoker for FU of asthma and mod obstructive sleep apnea.  -On CPAP 11 cm  Asthma was diagnosed in 2006. Her triggers include uri and weather changes. h/o seasonal allergies >>  No formal allergy testing.   She lost her job and cannot afford Symbicort anymore.  Has not taken it for about a month.  Has been using albuterol more often, reports occasional and intermittent wheezing. Developed pedal edema which is now subsided with Lasix, breathing is improved.  Compliant with CPAP no problems with mask or pressure.  Machine giving her some problems occasionally cuts off by itself.  Wonders if she can get a new machine.    Significant tests/ events reviewed  01/29/2012 Spirometry showed no airway obstruction, FEV1 was 82% FVC was 79% ratio 79  PSG 2013 moderate obstructive sleep apnea - AHI 18/h   Review of Systems Patient denies significant dyspnea,cough, hemoptysis,  chest pain, palpitations, pedal edema, orthopnea, paroxysmal nocturnal dyspnea, lightheadedness, nausea, vomiting, abdominal or  leg pains      Objective:   Physical Exam   Gen. Pleasant, obese, in no distress ENT - no lesions, no post nasal drip Neck: No JVD, no thyromegaly, no carotid bruits Lungs: no use of accessory muscles, no dullness to percussion, decreased without rales or rhonchi  Cardiovascular: Rhythm regular, heart sounds  normal, no murmurs or gallops, 1+ peripheral edema Musculoskeletal: No deformities, no cyanosis or clubbing , no tremors        Assessment & Plan:

## 2020-07-04 ENCOUNTER — Other Ambulatory Visit: Payer: Self-pay | Admitting: Adult Health

## 2020-11-16 ENCOUNTER — Other Ambulatory Visit: Payer: Self-pay | Admitting: Pulmonary Disease

## 2020-12-06 ENCOUNTER — Telehealth: Payer: Self-pay

## 2020-12-06 NOTE — Telephone Encounter (Signed)
Advised patient to bring SD card from CPAP for visit tomorrow. Nothing further needed.

## 2020-12-07 ENCOUNTER — Encounter: Payer: Self-pay | Admitting: Adult Health

## 2020-12-07 ENCOUNTER — Ambulatory Visit (INDEPENDENT_AMBULATORY_CARE_PROVIDER_SITE_OTHER): Payer: Medicare Other | Admitting: Adult Health

## 2020-12-07 ENCOUNTER — Other Ambulatory Visit: Payer: Self-pay

## 2020-12-07 ENCOUNTER — Ambulatory Visit (INDEPENDENT_AMBULATORY_CARE_PROVIDER_SITE_OTHER): Payer: Medicare Other

## 2020-12-07 VITALS — BP 128/84 | HR 83 | Temp 98.3°F | Ht 65.0 in | Wt 296.0 lb

## 2020-12-07 DIAGNOSIS — J309 Allergic rhinitis, unspecified: Secondary | ICD-10-CM | POA: Diagnosis not present

## 2020-12-07 DIAGNOSIS — G4733 Obstructive sleep apnea (adult) (pediatric): Secondary | ICD-10-CM

## 2020-12-07 DIAGNOSIS — J4530 Mild persistent asthma, uncomplicated: Secondary | ICD-10-CM

## 2020-12-07 NOTE — Progress Notes (Signed)
@Patient  ID: , female    DOB: 11/14/1965, 55 y.o.   MRN: [id number]  Chief Complaint  Patient presents with  . Follow-up    Referring provider: [id number], MD  HPI: 55 year old female never smoker followed for asthma and obstructive sleep apnea  TEST/EVENTS :  01/29/2012 Spirometry showed no airway obstruction, FEV1 was 82% FVC was 79% ratio 79  PSG 2013 moderate obstructive sleep apnea - AHI 18/h  12/07/2020 Follow up ; asthma and obstructive sleep apnea Patient returns for a 36-month follow-up.  Patient has underlying asthma.  She remains on Symbicort twice daily. Just restarted 2 weeks ago , did not have insurance . Breathing was not doing well off inhaler , had increased albuterol use. Insurance says it is not going to cover Symbicort.  Remains on Singulair . Needs refill.  Had asthmatic bronchitic flare last month , got antibiotics and steroids that improved . Still has a lot of drainage at time.   Patient has underlying obstructive sleep apnea.  She is on nocturnal CPAP.  Patient says she wears her CPAP every night cannot go without it.  Lately her machine is getting old and is not working properly.  She says she needs a new CPAP machine.  Her CPAP download shows excellent compliance with 99% usage.  Daily average usage at 5.5 hours.  Patient is on auto CPAP 5 to 15 cm H2O.  Daily average pressure at 11.1 cm H2O.  AHI 3.5.  Machine is almost 57 yrs old. Feels rested with decreased daytime sleepiness, feels she benefits from CPAP .     Covid vaccine is up-to-date including booster.   Disabled from back pain .   No Known Allergies  Immunization History  Administered Date(s) Administered  . Influenza Split 07/09/2011  . Influenza Whole 07/31/2012  . Influenza,inj,Quad PF,6+ Mos 07/01/2013, 07/29/2014, 08/25/2015, 06/14/2016, 06/30/2017  . Influenza-Unspecified 07/27/2019  . PFIZER(Purple Top)SARS-COV-2 Vaccination 01/02/2020, 01/23/2020  . Tdap  07/09/2011    Past Medical History:  Diagnosis Date  . Allergy   . Asthma   . Bronchitis   . GERD (gastroesophageal reflux disease)   . Sleep apnea    uses c pap     Tobacco History: Social History   Tobacco Use  Smoking Status Never Smoker  Smokeless Tobacco Never Used   Counseling given: Not Answered   Outpatient Medications Prior to Visit  Medication Sig Dispense Refill  . albuterol (PROVENTIL) (2.5 MG/3ML) 0.083% nebulizer solution Take 3 mLs (2.5 mg total) by nebulization every 6 (six) hours as needed for wheezing. 75 mL 12  . albuterol (VENTOLIN HFA) 108 (90 Base) MCG/ACT inhaler INHALE 2 PUFFS BY MOUTH EVERY 4 HOURS AS NEEDED FOR WHEEZE OR FOR SHORTNESS OF BREATH 17 each 6  . amLODipine (NORVASC) 10 MG tablet Take 10 mg by mouth daily.    07/11/2011 atenolol (TENORMIN) 100 MG tablet Take 100 mg by mouth daily.    . budesonide-formoterol (SYMBICORT) 160-4.5 MCG/ACT inhaler Inhale 2 puffs into the lungs in the morning and at bedtime. 1 each 6  . budesonide-formoterol (SYMBICORT) 80-4.5 MCG/ACT inhaler TAKE 2 PUFFS BY MOUTH TWICE A DAY 10.2 each 5  . cyclobenzaprine (FLEXERIL) 10 MG tablet Take 1 tablet (10 mg total) by mouth 2 (two) times daily as needed for muscle spasms. 20 tablet 0  . hydrochlorothiazide (HYDRODIURIL) 12.5 MG tablet TAKE 1 TABLET (12.5 MG TOTAL) DAILY BY MOUTH. (Patient taking differently: Take 25 mg by mouth daily.) 30 tablet 3  .  HYDROcodone-acetaminophen (NORCO/VICODIN) 5-325 MG tablet Take 1 tablet by mouth 2 (two) times daily as needed for moderate pain.    Marland Kitchen ibuprofen (ADVIL,MOTRIN) 800 MG tablet Take 1 tablet (800 mg total) by mouth every 8 (eight) hours as needed. 30 tablet 2  . meloxicam (MOBIC) 7.5 MG tablet meloxicam    . montelukast (SINGULAIR) 10 MG tablet TAKE 1 TABLET BY MOUTH EVERYDAY AT BEDTIME 30 tablet 3  . Nebulizers (COMPRESSOR/NEBULIZER) MISC As directed 1 each 0  . NON FORMULARY Antibiotic and prednisone for bronchitis    . PHENTERMINE HCL  PO Take by mouth.    . rosuvastatin (CRESTOR) 10 MG tablet Take 10 mg by mouth daily.    . furosemide (LASIX) 20 MG tablet Take 1 tablet (20 mg total) by mouth 2 (two) times daily as needed for up to 3 days. 6 tablet 0  . nabumetone (RELAFEN) 500 MG tablet Take 500 mg 2 (two) times daily as needed by mouth. (Patient not taking: Reported on 12/07/2020)  0   No facility-administered medications prior to visit.     Review of Systems:   Constitutional:   No  weight loss, night sweats,  Fevers, chills,  +fatigue, or  lassitude.  HEENT:   No headaches,  Difficulty swallowing,  Tooth/dental problems, or  Sore throat,                No sneezing, itching, ear ache,  +nasal congestion, post nasal drip,   CV:  No chest pain,  Orthopnea, PND, swelling in lower extremities, anasarca, dizziness, palpitations, syncope.   GI  No heartburn, indigestion, abdominal pain, nausea, vomiting, diarrhea, change in bowel habits, loss of appetite, bloody stools.   Resp: .  No chest wall deformity  Skin: no rash or lesions.  GU: no dysuria, change in color of urine, no urgency or frequency.  No flank pain, no hematuria   MS:  No joint pain or swelling.  No decreased range of motion.  No back pain.    Physical Exam  BP 128/84 (BP Location: Left Arm, Cuff Size: Large)   Pulse 83   Temp 98.3 F (36.8 C)   Ht 5\' 5"  (1.651 m)   Wt 296 lb (134.3 kg)   SpO2 97%   BMI 49.26 kg/m   GEN: A/Ox3; pleasant , NAD, well nourished    HEENT:  [place]/AT,   NOSE-clear, THROAT-clear, no lesions, no postnasal drip or exudate noted.   NECK:  Supple w/ fair ROM; no JVD; normal carotid impulses w/o bruits; no thyromegaly or nodules palpated; no lymphadenopathy.    RESP  Clear  P & A; w/o, wheezes/ rales/ or rhonchi. no accessory muscle use, no dullness to percussion  CARD:  RRR, no m/r/g, no peripheral edema, pulses intact, no cyanosis or clubbing.  GI:   Soft & nt; nml bowel sounds; no organomegaly or masses detected.    Musco: Warm bil, no deformities or joint swelling noted.   Neuro: alert, no focal deficits noted.    Skin: Warm, no lesions or rashes    Lab Results:   BNP No results found for: BNP  ProBNP No results found for: PROBNP  Imaging: No results found.    No flowsheet data found.  No results found for: NITRICOXIDE      Assessment & Plan:   OSA (obstructive sleep apnea) Excellent control and compliance on nocturnal CPAP.  CPAP machine is getting old and will need to be replaced. Order for new machine sent to  local homecare company with same settings.  Plan  Patient Instructions   Continue on Symbicort 2 puffs twice daily, rinse after use-  Contact insurance for formulary coverage for symbicort.  Continue on on Singulair daily Chest xray today .  Saline nasal rinses and saline gel.  Add Claritin 10mg  daily As needed  Drainage .  Order for new CPAP machine Continue on CPAP At bedtime   Do not drive if sleepy  Work on healthy weight .   Follow up with Dr.  In 4  months and As needed   Please contact office for sooner follow up if symptoms do not improve or worsen or seek emergency care       Allergic rhinitis Mild flare.  Add in Claritin.  And saline nasal spray and gel  Plan  Patient Instructions   Continue on Symbicort 2 puffs twice daily, rinse after use-  Contact insurance for formulary coverage for symbicort.  Continue on on Singulair daily Chest xray today .  Saline nasal rinses and saline gel.  Add Claritin 10mg  daily As needed  Drainage .  Order for new CPAP machine Continue on CPAP At bedtime   Do not drive if sleepy  Work on healthy weight .   Follow up with Dr. Vassie Carrillo  In 4  months and As needed   Please contact office for sooner follow up if symptoms do not improve or worsen or seek emergency care       Asthma Recent asthmatic bronchitic exacerbation.  Has some lingering symptoms.  Has been off of Symbicort only recently restarting.   We will need to get her formulary to decide which inhaler is best covered. Add in trigger prevention.  Add Claritin daily. Check chest x-ray today.  Plan  Patient Instructions   Continue on Symbicort 2 puffs twice daily, rinse after use-  Contact insurance for formulary coverage for symbicort.  Continue on on Singulair daily Chest xray today .  Saline nasal rinses and saline gel.  Add Claritin 10mg  daily As needed  Drainage .  Order for new CPAP machine Continue on CPAP At bedtime   Do not drive if sleepy  Work on healthy weight .   Follow up with Dr.  In 4  months and As needed   Please contact office for sooner follow up if symptoms do not improve or worsen or seek emergency care          Julia Loll, NP 12/07/2020

## 2020-12-07 NOTE — Assessment & Plan Note (Signed)
Excellent control and compliance on nocturnal CPAP.  CPAP machine is getting old and will need to be replaced. Order for new machine sent to local homecare company with same settings.  Plan  Patient Instructions   Continue on Symbicort 2 puffs twice daily, rinse after use-  Contact insurance for formulary coverage for symbicort.  Continue on on Singulair daily Chest xray today .  Saline nasal rinses and saline gel.  Add Claritin 10mg  daily As needed  Drainage .  Order for new CPAP machine Continue on CPAP At bedtime   Do not drive if sleepy  Work on healthy weight .   Follow up with Dr.  In 4  months and As needed   Please contact office for sooner follow up if symptoms do not improve or worsen or seek emergency care

## 2020-12-07 NOTE — Patient Instructions (Addendum)
Continue on Symbicort 2 puffs twice daily, rinse after use-  Contact insurance for formulary coverage for symbicort.  Continue on on Singulair daily Chest xray today .  Saline nasal rinses and saline gel.  Add Claritin 10mg  daily As needed  Drainage .  Order for new CPAP machine Continue on CPAP At bedtime   Do not drive if sleepy  Work on healthy weight .   Follow up with Dr.  In 4  months and As needed   Please contact office for sooner follow up if symptoms do not improve or worsen or seek emergency care

## 2020-12-07 NOTE — Assessment & Plan Note (Signed)
Recent asthmatic bronchitic exacerbation.  Has some lingering symptoms.  Has been off of Symbicort only recently restarting.  We will need to get her formulary to decide which inhaler is best covered. Add in trigger prevention.  Add Claritin daily. Check chest x-ray today.  Plan  Patient Instructions   Continue on Symbicort 2 puffs twice daily, rinse after use-  Contact insurance for formulary coverage for symbicort.  Continue on on Singulair daily Chest xray today .  Saline nasal rinses and saline gel.  Add Claritin 10mg  daily As needed  Drainage .  Order for new CPAP machine Continue on CPAP At bedtime   Do not drive if sleepy  Work on healthy weight .   Follow up with Dr.  In 4  months and As needed   Please contact office for sooner follow up if symptoms do not improve or worsen or seek emergency care

## 2020-12-07 NOTE — Assessment & Plan Note (Signed)
Mild flare.  Add in Claritin.  And saline nasal spray and gel  Plan  Patient Instructions   Continue on Symbicort 2 puffs twice daily, rinse after use-  Contact insurance for formulary coverage for symbicort.  Continue on on Singulair daily Chest xray today .  Saline nasal rinses and saline gel.  Add Claritin 10mg  daily As needed  Drainage .  Order for new CPAP machine Continue on CPAP At bedtime   Do not drive if sleepy  Work on healthy weight .   Follow up with Dr.  In 4  months and As needed   Please contact office for sooner follow up if symptoms do not improve or worsen or seek emergency care

## 2020-12-08 NOTE — Progress Notes (Signed)
Spoke with pt and notified of results per Tammy Parrett, NP. Pt verbalized understanding and denied any questions. 

## 2021-03-21 ENCOUNTER — Other Ambulatory Visit: Payer: Self-pay

## 2021-03-21 ENCOUNTER — Ambulatory Visit
Admission: RE | Admit: 2021-03-21 | Discharge: 2021-03-21 | Disposition: A | Discharge status: Home / Self Care | Payer: Medicare Other | Source: Ambulatory Visit | Attending: Student | Admitting: Student

## 2021-03-21 VITALS — BP 145/83 | HR 92 | Temp 98.6°F | Resp 16

## 2021-03-21 DIAGNOSIS — K439 Ventral hernia without obstruction or gangrene: Secondary | ICD-10-CM | POA: Insufficient documentation

## 2021-03-21 DIAGNOSIS — Z8759 Personal history of other complications of pregnancy, childbirth and the puerperium: Secondary | ICD-10-CM

## 2021-03-21 DIAGNOSIS — N760 Acute vaginitis: Secondary | ICD-10-CM | POA: Insufficient documentation

## 2021-03-21 DIAGNOSIS — Z3202 Encounter for pregnancy test, result negative: Secondary | ICD-10-CM | POA: Diagnosis not present

## 2021-03-21 DIAGNOSIS — Z780 Asymptomatic menopausal state: Secondary | ICD-10-CM | POA: Insufficient documentation

## 2021-03-21 DIAGNOSIS — B9689 Other specified bacterial agents as the cause of diseases classified elsewhere: Secondary | ICD-10-CM | POA: Insufficient documentation

## 2021-03-21 LAB — POCT URINALYSIS DIP (MANUAL ENTRY)
Bilirubin, UA: NEGATIVE
Glucose, UA: NEGATIVE mg/dL
Ketones, POC UA: NEGATIVE mg/dL
Nitrite, UA: NEGATIVE
Protein Ur, POC: NEGATIVE mg/dL
Spec Grav, UA: 1.015 (ref 1.010–1.025)
Urobilinogen, UA: 0.2 E.U./dL
pH, UA: 7 (ref 5.0–8.0)

## 2021-03-21 LAB — POCT URINE PREGNANCY: Preg Test, Ur: NEGATIVE

## 2021-03-21 MED ORDER — METRONIDAZOLE 500 MG PO TABS: 500.0000 mg | ORAL_TABLET | Freq: Two times a day (BID) | ORAL | 0 refills | Status: DC

## 2021-03-21 NOTE — ED Provider Notes (Signed)
[facility]    CSN: [id number] Arrival date & time: 03/21/21  1100      History   Chief Complaint No chief complaint on file.   HPI [name] is a 55 y.o. female presenting with vaginal discharge. Medical history tubal pregnancy. Pt is postmenopausal x4 years. Notes 1 week of scant brown discharge and foul smell.  States initially she had some bleeding for about 4 days, but this seems to have resolved. occ crampy right lower abd pain.  Adamantly denies new partners, she is monogamous with her husband. Denies hematuria, dysuria, frequency, urgency, back pain, n/v/d/abd pain, fevers/chills, abdnormal vaginal rashes/lesions.  Bowel movements are regular and she is still passing gas, but does note the occasional bulge in her abdominal wall.   HPI  Past Medical History:  Diagnosis Date  . Allergy   . Asthma   . Bronchitis   . GERD (gastroesophageal reflux disease)   . Sleep apnea    uses c pap     Patient Active Problem List   Diagnosis Date Noted  . HTN (hypertension) 08/29/2017  . Vitamin D deficiency 06/30/2017  . Lumbar degenerative disc disease 06/04/2017  . Chronic foot pain, left 06/04/2017  . Chronic pain of left ankle 06/04/2017  . Plantar fasciitis 06/04/2017  . Carpal tunnel syndrome on both sides 11/25/2016  . Allergic rhinitis 11/13/2015  . 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 Porokeratosis 12/16/2014  . Pain of left heel 12/05/2014  . Severe obesity (BMI >= 40) (HCC) 12/15/2013  . Other dysphagia 12/15/2013  . Edema 07/22/2012  . Visual changes 07/22/2012  . Seasonal allergic rhinitis 12/16/2011  . Asthma 11/13/2011  . General medical examination 07/09/2011  . Exposure to hepatitis B 07/09/2011  . OSA (obstructive sleep apnea) 07/09/2011  . Back pain 07/09/2011    Past Surgical History:  Procedure Laterality Date  . DENTAL SURGERY    . ECTOPIC PREGNANCY SURGERY  2006    OB History   No obstetric history on file.      Home  Medications    Prior to Admission medications   Medication Sig Start Date End Date Taking? Authorizing Provider  metroNIDAZOLE (FLAGYL) 500 MG tablet Take 1 tablet (500 mg total) by mouth 2 (two) times daily. 03/21/21  Yes Rhys MartiniGraham, Hydia Copelin E, PA-C  albuterol (PROVENTIL) (2.5 MG/3ML) 0.083% nebulizer solution Take 3 mLs (2.5 mg total) by nebulization every 6 (six) hours as needed for wheezing. 06/06/20 12/13/24  Oretha MilchAlva, Rakesh V, MD  albuterol (VENTOLIN HFA) 108 (90 Base) MCG/ACT inhaler INHALE 2 PUFFS BY MOUTH EVERY 4 HOURS AS NEEDED FOR WHEEZE OR FOR SHORTNESS OF BREATH 07/04/20   Parrett, Tammy S, NP  amLODipine (NORVASC) 10 MG tablet Take 10 mg by mouth daily.    [provider]  atenolol (TENORMIN) 100 MG tablet Take 100 mg by mouth daily.    [provider]  budesonide-formoterol (SYMBICORT) 160-4.5 MCG/ACT inhaler Inhale 2 puffs into the lungs in the morning and at bedtime. 06/06/20   Oretha MilchAlva, Rakesh V, MD  budesonide-formoterol (SYMBICORT) 80-4.5 MCG/ACT inhaler TAKE 2 PUFFS BY MOUTH TWICE A DAY 11/16/20   Oretha MilchAlva, Rakesh V, MD  cyclobenzaprine (FLEXERIL) 10 MG tablet Take 1 tablet (10 mg total) by mouth 2 (two) times daily as needed for muscle spasms. 07/08/18   Wieters, Hallie C, PA-C  furosemide (LASIX) 20 MG tablet Take 1 tablet (20 mg total) by mouth 2 (two) times daily as needed for up to 3 days. 01/17/20 01/20/20  Hall-Potvin,  [place], PA-C  hydrochlorothiazide (HYDRODIURIL) 12.5 MG tablet TAKE 1 TABLET (12.5 MG TOTAL) DAILY BY MOUTH. Patient taking differently: Take 25 mg by mouth daily. 12/22/17   Sheliah Hatch, MD  HYDROcodone-acetaminophen (NORCO/VICODIN) 5-325 MG tablet Take 1 tablet by mouth 2 (two) times daily as needed for moderate pain.    [provider]  ibuprofen (ADVIL,MOTRIN) 800 MG tablet Take 1 tablet (800 mg total) by mouth every 8 (eight) hours as needed. 01/13/18   Tarry Kos, MD  meloxicam (MOBIC) 7.5 MG tablet meloxicam    [provider]   montelukast (SINGULAIR) 10 MG tablet TAKE 1 TABLET BY MOUTH EVERYDAY AT BEDTIME 09/19/17   Oretha Milch, MD  nabumetone (RELAFEN) 500 MG tablet Take 500 mg 2 (two) times daily as needed by mouth. Patient not taking: Reported on 12/07/2020 08/19/17   [provider]  Nebulizers (COMPRESSOR/NEBULIZER) MISC As directed 09/20/11   Zola Button, Grayling Congress, DO  NON FORMULARY Antibiotic and prednisone for bronchitis    [provider]  PHENTERMINE HCL PO Take by mouth.    [provider]  rosuvastatin (CRESTOR) 10 MG tablet Take 10 mg by mouth daily.    [provider]    Family History Family History  Problem Relation Age of Onset  . Hypertension Father   . Hypertension Sister   . Stroke Paternal Grandfather   . Hypertension Paternal Grandfather   . Hypertension Sister   . Colon cancer Neg Hx   . Colon polyps Neg Hx   . Esophageal cancer Neg Hx   . Rectal cancer Neg Hx   . Stomach cancer Neg Hx   . Breast cancer Neg Hx     Social History Social History   Tobacco Use  . Smoking status: Never Smoker  . Smokeless tobacco: Never Used  Vaping Use  . Vaping Use: Never used  Substance Use Topics  . Alcohol use: Yes    Alcohol/week: 0.0 standard drinks    Comment: rare  . Drug use: No     Allergies   Patient has no known allergies.   Review of Systems Review of Systems  Constitutional: Negative for appetite change, chills, diaphoresis and fever.  Respiratory: Negative for shortness of breath.   Cardiovascular: Negative for chest pain.  Gastrointestinal: Negative for abdominal pain, blood in stool, constipation, diarrhea, nausea and vomiting.       Bulge in abd wall  Genitourinary: Positive for vaginal bleeding and vaginal discharge. Negative for decreased urine volume, difficulty urinating, dysuria, flank pain, frequency, genital sores, hematuria, menstrual problem, pelvic pain, urgency and vaginal pain.  Musculoskeletal: Negative for back  pain.  Neurological: Negative for dizziness, weakness and light-headedness.  All other systems reviewed and are negative.    Physical Exam Triage Vital Signs ED Triage Vitals  Enc Vitals Group     BP 03/21/21 1113 (!) 145/83     Pulse Rate 03/21/21 1113 92     Resp 03/21/21 1113 16     Temp 03/21/21 1113 98.6 F (37 C)     Temp Source 03/21/21 1113 Oral     SpO2 03/21/21 1113 95 %     Weight --      Height --      Head Circumference --      Peak Flow --      Pain Score 03/21/21 1111 3     Pain Loc --      Pain Edu? --  Excl. in GC? --    No data found.  Updated Vital Signs BP (!) 145/83 (BP Location: Right Arm)   Pulse 92   Temp 98.6 F (37 C) (Oral)   Resp 16   SpO2 95%   Visual Acuity Right Eye Distance:   Left Eye Distance:   Bilateral Distance:    Right Eye Near:   Left Eye Near:    Bilateral Near:     Physical Exam Vitals reviewed.  Constitutional:      General: She is not in acute distress.    Appearance: Normal appearance. She is not ill-appearing or diaphoretic.  HENT:     Head: Normocephalic and atraumatic.  Eyes:     Extraocular Movements: Extraocular movements intact.     Pupils: Pupils are equal, round, and reactive to light.  Cardiovascular:     Rate and Rhythm: Normal rate and regular rhythm.     Heart sounds: Normal heart sounds.  Pulmonary:     Effort: Pulmonary effort is normal.     Breath sounds: Normal breath sounds.  Abdominal:     General: Bowel sounds are normal.     Palpations: Abdomen is soft.     Tenderness: There is no abdominal tenderness. There is no right CVA tenderness, left CVA tenderness, guarding or rebound.     Hernia: A hernia is present. Hernia is present in the ventral area.       Comments: Ventral hernia appreciated. Nontender, reducible. BS positive in all 4 quadrants.   Skin:    General: Skin is warm.     Capillary Refill: Capillary refill takes less than 2 seconds.  Neurological:     General: No  focal deficit present.     Mental Status: She is alert and oriented to person, place, and time.  Psychiatric:        Mood and Affect: Mood normal.        Behavior: Behavior normal.        Thought Content: Thought content normal.        Judgment: Judgment normal.      UC Treatments / Results  Labs (all labs ordered are listed, but only abnormal results are displayed) Labs Reviewed  POCT URINALYSIS DIP (MANUAL ENTRY) - Abnormal; Notable for the following components:      Result Value   Blood, UA small (*)    Leukocytes, UA Small (1+) (*)    All other components within normal limits  URINE CULTURE  POCT URINE PREGNANCY  CERVICOVAGINAL ANCILLARY ONLY    EKG   Radiology No results found.  Procedures Procedures (including critical care time)  Medications Ordered in UC Medications - No data to display  Initial Impression / Assessment and Plan / UC Course  I have reviewed the triage vital signs and the nursing notes.  Pertinent labs & imaging results that were available during my care of the patient were reviewed by me and considered in my medical decision making (see chart for details).     This patient is a 55 year old female presenting with suspected BV and suspected ventral hernia.  Afebrile, nontachycardic, no abdominal pain or CVAT.    Patient did not present for the hernia, but mentioned it as an incidental complaint.  Currently absolutely no abdominal pain, the hernia is reducible and nontender. suspect this is related to history of surgery for ectopic pregnancy. Hernia is reducible and nontender. F/u with [facility] at their earliest convenience.  Denies STI risk.  UA  with small blood, small leuk. Culture sent. Low suspicion for UTI, so will wait to treat for this pending culture. Pt is perimenopausal but requesting pregnancy test- negative.  Self swab sent for BV, yeast, gonorrhea, chlamydia, trichomonas.  Will treat empirically for BV as below.    Discussed that any bleeding in a postmenopausal woman can be a sign of endometrial cancer, follow-up with GYN if symptoms persist  ED return precautions discussed.  Coding this visit a Level 4 for new problem (abd hernia) with uncertain prognosis, and prescription drug management.   Final Clinical Impressions(s) / UC Diagnoses   Final diagnoses:  Bacterial vaginosis  Hernia of abdominal wall  History of ectopic pregnancy  Negative pregnancy test  Postmenopausal     Discharge Instructions     -I suspect that you have bacterial vaginosis.  This is a condition caused by abnormal pH in the vagina.  It can be caused by soap, fabric, etc.  Is not contagious and your husband does not need to be treated. -We are treating you today with Flagyl (metronidazole), twice daily for 7 days.  Make sure to avoid alcohol while taking this medication as it will make you very nauseous. -We are confirming your diagnosis with a swab that we will test for BV and yeast.  I also sent your urine to the lab to make sure you do not have a UTI. -Follow-up with PCP or GYN if your vaginal symptoms of bleeding and discharge continue. -I also suspect that you have a hernia of the abdominal wall caused by the surgery for your tubal pregnancy.  You should follow-up with [facility] surgery outpatient for evaluation of this.  Information below, but your primary care may need to send this referral.  If you start developing severe abdominal pain, especially if you have decreased bowel movements-head straight to the emergency department    ED Prescriptions    Medication Sig Dispense Auth. Provider   metroNIDAZOLE (FLAGYL) 500 MG tablet Take 1 tablet (500 mg total) by mouth 2 (two) times daily. 14 tablet Rhys Martini, PA-C     PDMP not reviewed this encounter.   Rhys Martini, PA-C 03/21/21 1201

## 2021-03-21 NOTE — ED Triage Notes (Signed)
Pt said has not had  Period x 4 years and then last week she began to bleed and have a slight smell. Pt said bleeding has stopped and now smell is gone. Some slight dark discharge still.

## 2021-03-21 NOTE — Discharge Instructions (Addendum)
-  I suspect that you have bacterial vaginosis.  This is a condition caused by abnormal pH in the vagina.  It can be caused by soap, fabric, etc.  Is not contagious and your husband does not need to be treated. -We are treating you today with Flagyl (metronidazole), twice daily for 7 days.  Make sure to avoid alcohol while taking this medication as it will make you very nauseous. -We are confirming your diagnosis with a swab that we will test for BV and yeast.  I also sent your urine to the lab to make sure you do not have a UTI. -Follow-up with PCP or GYN if your vaginal symptoms of bleeding and discharge continue. -I also suspect that you have a hernia of the abdominal wall caused by the surgery for your tubal pregnancy.  You should follow-up with [facility] surgery outpatient for evaluation of this.  Information below, but your primary care may need to send this referral.  If you start developing severe abdominal pain, especially if you have decreased bowel movements-head straight to the emergency department

## 2021-03-22 LAB — CERVICOVAGINAL ANCILLARY ONLY
Bacterial Vaginitis (gardnerella): POSITIVE — AB
Candida Glabrata: NEGATIVE
Candida Vaginitis: NEGATIVE
Chlamydia: NEGATIVE
Comment: NEGATIVE
Comment: NEGATIVE
Comment: NEGATIVE
Comment: NEGATIVE
Comment: NEGATIVE
Comment: NORMAL
Neisseria Gonorrhea: NEGATIVE
Trichomonas: POSITIVE — AB

## 2021-03-22 LAB — URINE CULTURE: Culture: >=100000 — AB

## 2021-03-26 ENCOUNTER — Telehealth (HOSPITAL_COMMUNITY): Payer: Self-pay | Admitting: Emergency Medicine

## 2021-03-26 MED ORDER — FLUCONAZOLE 150 MG PO TABS: 150.0000 mg | ORAL_TABLET | Freq: Once | ORAL | 0 refills | Status: AC

## 2021-03-26 NOTE — Telephone Encounter (Signed)
Patient states she is developing a yeast infection with Metronidazole, Diflucan sent to pharmacy of request, per protocol

## 2021-04-30 ENCOUNTER — Encounter: Payer: Self-pay | Admitting: Pulmonary Disease

## 2021-04-30 ENCOUNTER — Other Ambulatory Visit: Payer: Self-pay

## 2021-04-30 ENCOUNTER — Ambulatory Visit (INDEPENDENT_AMBULATORY_CARE_PROVIDER_SITE_OTHER): Payer: Medicare Other | Admitting: Pulmonary Disease

## 2021-04-30 DIAGNOSIS — G4733 Obstructive sleep apnea (adult) (pediatric): Secondary | ICD-10-CM | POA: Diagnosis not present

## 2021-04-30 DIAGNOSIS — J4530 Mild persistent asthma, uncomplicated: Secondary | ICD-10-CM

## 2021-04-30 MED ORDER — BUDESONIDE-FORMOTEROL FUMARATE 160-4.5 MCG/ACT IN AERO: 2.0000 | INHALATION_SPRAY | Freq: Two times a day (BID) | RESPIRATORY_TRACT | 6 refills | Status: DC

## 2021-04-30 NOTE — Progress Notes (Signed)
   Subjective:    Patient ID: [name], female    DOB: [birth year], 55 y.o.   MRN: [id number]  HPI  55 yo never smoker for FU of asthma and mod obstructive sleep apnea.  -On auto CPAP    Asthma was diagnosed in 2006. Her triggers include uri and weather changes. h/o seasonal allergies >>  No formal allergy testing  On her last visit with me 05/2019 when we gave her a new prescription for Breo and a new auto CPAP.  Virgel Bouquet was not covered by insurance and she is back on Symbicort 80.  She reports increased wheezing intermittently. Medication list was reviewed and updated. We sent a prescription for new CPAP but she has not received this yet. She is compliant with nasal pillows and feels rested on waking up.  She would like to know alternatives to CPAP   Significant tests/ events reviewed   01/29/2012 Spirometry showed no airway obstruction, FEV1 was 82% FVC was 79% ratio 79 PSG 2013 moderate obstructive sleep apnea - AHI 18/h   Review of Systems neg for any significant sore throat, dysphagia, itching, sneezing, nasal congestion or excess/ purulent secretions, fever, chills, sweats, unintended wt loss, pleuritic or exertional cp, hempoptysis, orthopnea pnd or change in chronic leg swelling. Also denies presyncope, palpitations, heartburn, abdominal pain, nausea, vomiting, diarrhea or change in bowel or urinary habits, dysuria,hematuria, rash, arthralgias, visual complaints, headache, numbness weakness or ataxia.     Objective:   Physical Exam  Gen. Pleasant, obese, in no distress ENT - no lesions, no post nasal drip Neck: No JVD, no thyromegaly, no carotid bruits Lungs: no use of accessory muscles, no dullness to percussion, decreased without rales or rhonchi  Cardiovascular: Rhythm regular, heart sounds  normal, no murmurs or gallops, no peripheral edema Musculoskeletal: No deformities, no cyanosis or clubbing , no tremors       Assessment & Plan:

## 2021-04-30 NOTE — Assessment & Plan Note (Signed)
Prescription for auto CPAP has been sent already. CPAP download was reviewed which shows good compliance and good control of events on average pressure of 11 cm with minimal leak.  She has settled down with nasal pillows  Weight loss encouraged, compliance with goal of at least 4-6 hrs every night is the expectation. Advised against medications with sedative side effects Cautioned against driving when sleepy - understanding that sleepiness will vary on a day to day basis

## 2021-04-30 NOTE — Patient Instructions (Signed)
Increase to  symbicort 160  CPAP is working well , supplies will be renewed x 1 year

## 2021-04-30 NOTE — Assessment & Plan Note (Signed)
She is having breakthrough wheezing on Symbicort 80.  She is compliant with Singulair. We will increase to Symbicort 160 and emphasized 2 puffs twice daily. She will use albuterol for breakthrough and call us if wheezing persists. No other environmental changes to account for worsening asthma symptoms

## 2021-05-11 ENCOUNTER — Other Ambulatory Visit: Payer: Self-pay | Admitting: Internal Medicine

## 2021-05-11 DIAGNOSIS — Z1231 Encounter for screening mammogram for malignant neoplasm of breast: Secondary | ICD-10-CM

## 2021-05-14 ENCOUNTER — Other Ambulatory Visit: Payer: Self-pay | Admitting: Surgery

## 2021-05-14 DIAGNOSIS — R1033 Periumbilical pain: Secondary | ICD-10-CM

## 2021-06-04 ENCOUNTER — Ambulatory Visit
Admission: RE | Admit: 2021-06-04 | Discharge: 2021-06-04 | Disposition: A | Discharge status: Home / Self Care | Payer: Medicare Other | Source: Ambulatory Visit | Attending: Internal Medicine | Admitting: Internal Medicine

## 2021-06-04 ENCOUNTER — Other Ambulatory Visit: Payer: Self-pay

## 2021-06-04 DIAGNOSIS — Z1231 Encounter for screening mammogram for malignant neoplasm of breast: Secondary | ICD-10-CM

## 2021-06-14 ENCOUNTER — Other Ambulatory Visit: Payer: Self-pay | Admitting: *Deleted

## 2021-06-14 MED ORDER — ALBUTEROL SULFATE HFA 108 (90 BASE) MCG/ACT IN AERS: INHALATION_SPRAY | RESPIRATORY_TRACT | 6 refills | Status: DC

## 2021-07-07 ENCOUNTER — Other Ambulatory Visit: Payer: Self-pay | Admitting: Adult Health

## 2021-08-06 ENCOUNTER — Other Ambulatory Visit: Payer: Self-pay | Admitting: Adult Health

## 2021-08-30 ENCOUNTER — Other Ambulatory Visit: Payer: Self-pay

## 2021-08-30 ENCOUNTER — Ambulatory Visit (INDEPENDENT_AMBULATORY_CARE_PROVIDER_SITE_OTHER): Payer: Medicare Other | Admitting: Pulmonary Disease

## 2021-08-30 ENCOUNTER — Encounter: Payer: Self-pay | Admitting: Pulmonary Disease

## 2021-08-30 VITALS — BP 130/70 | HR 91 | Temp 98.3°F | Ht 65.0 in | Wt 302.0 lb

## 2021-08-30 DIAGNOSIS — G4733 Obstructive sleep apnea (adult) (pediatric): Secondary | ICD-10-CM | POA: Diagnosis not present

## 2021-08-30 DIAGNOSIS — J4530 Mild persistent asthma, uncomplicated: Secondary | ICD-10-CM | POA: Diagnosis not present

## 2021-08-30 DIAGNOSIS — Z23 Encounter for immunization: Secondary | ICD-10-CM | POA: Diagnosis not present

## 2021-08-30 NOTE — Progress Notes (Signed)
   Subjective:    Patient ID: [name], female    DOB: Apr 09, 1966, 55 y.o.   MRN: [id number]  HPI  55 yo never smoker for FU of asthma and mod obstructive sleep apnea.  -On auto CPAP    Asthma was diagnosed in 2006. Her triggers include uri and weather changes. h/o seasonal allergies >>  No formal allergy testing   Last OV 04/2021 -she was having breakthrough wheezing on Symbicort 80, so this was increased to Symbicort 160.  Prescription for new CPAP was sent  She received this a couple months ago, states that she is resting better, compliant with nasal pillows and denies any problems with pressure. She reports intermittent wheezing, uses albuterol MDI especially in the morning daily which is her worst time, uses Singulair at night  Takes Lasix daily for pedal edema, weight has gone up about 6 pounds to 302  Significant tests/ events reviewed  01/29/2012 Spirometry showed no airway obstruction, FEV1 was 82% FVC was 79% ratio 79 PSG 2013 moderate obstructive sleep apnea - AHI 18/h   Review of Systems neg for any significant sore throat, dysphagia, itching, sneezing, nasal congestion or excess/ purulent secretions, fever, chills, sweats, unintended wt loss, pleuritic or exertional cp, hempoptysis, orthopnea pnd or change in chronic leg swelling. Also denies presyncope, palpitations, heartburn, abdominal pain, nausea, vomiting, diarrhea or change in bowel or urinary habits, dysuria,hematuria, rash, arthralgias, visual complaints, headache, numbness weakness or ataxia.     Objective:   Physical Exam  Gen. Pleasant, obese, in no distress ENT - no lesions, no post nasal drip Neck: No JVD, no thyromegaly, no carotid bruits Lungs: no use of accessory muscles, no dullness to percussion, decreased without rales or rhonchi  Cardiovascular: Rhythm regular, heart sounds  normal, no murmurs or gallops, 1+ peripheral edema Musculoskeletal: No deformities, no cyanosis or clubbing , no  tremors       Assessment & Plan:

## 2021-08-30 NOTE — Assessment & Plan Note (Signed)
CPAP download was reviewed which shows excellent control of events 11 cm, good compliance more than 6 hours every night and moderate leak. She is very compliant and CPAP is certainly helped improve her daytime somnolence and fatigue Current settings are working well  Weight loss encouraged, compliance with goal of at least 4-6 hrs every night is the expectation. Advised against medications with sedative side effects Cautioned against driving when sleepy - understanding that sleepiness will vary on a day to day basis

## 2021-08-30 NOTE — Patient Instructions (Signed)
Schedule spirometry pre/post Flu shot CPAP is working well

## 2021-08-30 NOTE — Assessment & Plan Note (Signed)
Appears to be moderate persistent Symbicort will be continued at 180 Has frequent use of albuterol but not clear whether this is related to true asthma or just obesity/deconditioning/chronic diastolic heart failure. Will obtain spirometry to clarify if we need to step up therapy

## 2021-11-01 ENCOUNTER — Ambulatory Visit: Payer: Medicare Other | Admitting: Adult Health

## 2022-01-02 ENCOUNTER — Ambulatory Visit: Payer: Medicare Other | Admitting: Pulmonary Disease

## 2022-01-24 ENCOUNTER — Ambulatory Visit (INDEPENDENT_AMBULATORY_CARE_PROVIDER_SITE_OTHER): Payer: Medicare Other | Admitting: Pulmonary Disease

## 2022-01-24 DIAGNOSIS — G4733 Obstructive sleep apnea (adult) (pediatric): Secondary | ICD-10-CM

## 2022-01-24 DIAGNOSIS — J4530 Mild persistent asthma, uncomplicated: Secondary | ICD-10-CM | POA: Diagnosis not present

## 2022-01-24 LAB — PULMONARY FUNCTION TEST
FEF 25-75 Post: 2.3 L/sec
FEF 25-75 Pre: 1.86 L/sec
FEF2575-%Change-Post: 23 %
FEF2575-%Pred-Post: 101 %
FEF2575-%Pred-Pre: 82 %
FEV1-%Change-Post: 4 %
FEV1-%Pred-Post: 77 %
FEV1-%Pred-Pre: 73 %
FEV1-Post: 1.74 L
FEV1-Pre: 1.66 L
FEV1FVC-%Change-Post: -2 %
FEV1FVC-%Pred-Pre: 105 %
FEV6-%Change-Post: 6 %
FEV6-%Pred-Post: 75 %
FEV6-%Pred-Pre: 71 %
FEV6-Post: 2.09 L
FEV6-Pre: 1.97 L
FEV6FVC-%Change-Post: -1 %
FEV6FVC-%Pred-Post: 101 %
FEV6FVC-%Pred-Pre: 103 %
FVC-%Change-Post: 8 %
FVC-%Pred-Post: 74 %
FVC-%Pred-Pre: 69 %
FVC-Post: 2.13 L
FVC-Pre: 1.97 L
Post FEV1/FVC ratio: 82 %
Post FEV6/FVC ratio: 98 %
Pre FEV1/FVC ratio: 84 %
Pre FEV6/FVC Ratio: 100 %

## 2022-01-24 NOTE — Progress Notes (Signed)
PFT done today. 

## 2022-02-05 ENCOUNTER — Encounter: Payer: Self-pay | Admitting: Pulmonary Disease

## 2022-02-05 ENCOUNTER — Ambulatory Visit (INDEPENDENT_AMBULATORY_CARE_PROVIDER_SITE_OTHER): Payer: Medicare Other | Admitting: Pulmonary Disease

## 2022-02-05 DIAGNOSIS — J4530 Mild persistent asthma, uncomplicated: Secondary | ICD-10-CM | POA: Diagnosis not present

## 2022-02-05 DIAGNOSIS — G4733 Obstructive sleep apnea (adult) (pediatric): Secondary | ICD-10-CM

## 2022-02-05 NOTE — Progress Notes (Signed)
? ?  Subjective:  ? ? Patient ID: [name], female    DOB: 02/23/1966, 56 y.o.   MRN: [id number] ? ?HPI ? ?56 yo never smoker for FU of asthma and mod obstructive sleep apnea.  ?-On auto CPAP  ?  ?Asthma was diagnosed in 2006. Her triggers include uri and weather changes. h/o seasonal allergies >>  No formal allergy testing ? ?OV 04/2021 -she was having breakthrough wheezing on Symbicort 80, so this was increased to Symbicort 160.  ?Has frequent use of albuterol but not clear whether this is related to true asthma or just obesity/deconditioning/chronic diastolic heart failure. ?51-month follow-up visit ?-She reports significant improvement in her asthma symptoms, using albuterol only 2 or 3 times a week, compliant with Symbicort.  She is also on Singulair, has not no issues during allergy season. ? ?She denies any problems with CPAP mask or pressure.  CPAP has not helped improve her daytime somnolence and fatigue ? ? ?Significant tests/ events reviewed ?01/2022 spirometry shows no obstruction with ratio 84, FEV1 73%, FVC 69% ? ?01/29/2012 Spirometry showed no airway obstruction, FEV1 was 82% FVC was 79% ratio 79 ?PSG 2013 moderate obstructive sleep apnea - AHI 18/h  ? ?Review of Systems ?neg for any significant sore throat, dysphagia, itching, sneezing, nasal congestion or excess/ purulent secretions, fever, chills, sweats, unintended wt loss, pleuritic or exertional cp, hempoptysis, orthopnea pnd or change in chronic leg swelling. Also denies presyncope, palpitations, heartburn, abdominal pain, nausea, vomiting, diarrhea or change in bowel or urinary habits, dysuria,hematuria, rash, arthralgias, visual complaints, headache, numbness weakness or ataxia. ? ?   ?Objective:  ? Physical Exam ? ? ?Gen. Pleasant, obese, in no distress ?ENT - no lesions, no post nasal drip ?Neck: No JVD, no thyromegaly, no carotid bruits ?Lungs: no use of accessory muscles, no dullness to percussion, decreased without rales or rhonchi   ?Cardiovascular: Rhythm regular, heart sounds  normal, no murmurs or gallops, no peripheral edema ?Musculoskeletal: No deformities, no cyanosis or clubbing , no tremors ? ? ? ? ?   ?Assessment & Plan:  ? ? ?

## 2022-02-05 NOTE — Assessment & Plan Note (Signed)
CPAP download was reviewed on 11 cm which shows excellent control of events, good compliance more than 6 hours every night and minimal leak ? ?Weight loss encouraged, compliance with goal of at least 4-6 hrs every night is the expectation. ?Advised against medications with sedative side effects ?Cautioned against driving when sleepy - understanding that sleepiness will vary on a day to day basis ? ?

## 2022-02-05 NOTE — Assessment & Plan Note (Signed)
Much better controlled on Symbicort 160 ?She will continue to use albuterol for breakthrough and Singulair on a daily basis ?

## 2022-02-05 NOTE — Patient Instructions (Signed)
  Stay on symbicort CPAP is working well 

## 2022-03-09 ENCOUNTER — Other Ambulatory Visit: Payer: Self-pay | Admitting: Pulmonary Disease

## 2022-05-28 ENCOUNTER — Other Ambulatory Visit: Payer: Self-pay | Admitting: Internal Medicine

## 2022-05-28 DIAGNOSIS — Z1231 Encounter for screening mammogram for malignant neoplasm of breast: Secondary | ICD-10-CM

## 2022-06-07 ENCOUNTER — Ambulatory Visit
Admission: RE | Admit: 2022-06-07 | Discharge: 2022-06-07 | Disposition: A | Discharge status: Home / Self Care | Payer: Medicare Other | Source: Ambulatory Visit | Attending: Internal Medicine | Admitting: Internal Medicine

## 2022-06-07 DIAGNOSIS — Z1231 Encounter for screening mammogram for malignant neoplasm of breast: Secondary | ICD-10-CM

## 2022-07-18 ENCOUNTER — Telehealth: Payer: Self-pay

## 2022-07-18 NOTE — Telephone Encounter (Signed)
Pt has not been seen since 2019.  Unfortunately I am not able to accept at this time.  I wish her the best

## 2022-07-18 NOTE — Telephone Encounter (Signed)
Patient called and wanted to be seen, she said it had been a while since she had been here. Upon reviewing her chart, I noticed she had another PCP and hadn't been seen by you since 2021. I asked patient if she was currently seeing another PCP she stated she had been but is unhappy with the service. She wanted to call and see if Dr Birdie Riddle would take her back and be her PCP. I let her know that I would ask and give her a call back with an answer.

## 2022-08-07 ENCOUNTER — Encounter: Payer: Self-pay | Admitting: Adult Health

## 2022-08-07 ENCOUNTER — Ambulatory Visit (INDEPENDENT_AMBULATORY_CARE_PROVIDER_SITE_OTHER): Payer: Medicare Other | Admitting: Adult Health

## 2022-08-07 DIAGNOSIS — J309 Allergic rhinitis, unspecified: Secondary | ICD-10-CM | POA: Diagnosis not present

## 2022-08-07 DIAGNOSIS — J4531 Mild persistent asthma with (acute) exacerbation: Secondary | ICD-10-CM

## 2022-08-07 DIAGNOSIS — G4733 Obstructive sleep apnea (adult) (pediatric): Secondary | ICD-10-CM | POA: Diagnosis not present

## 2022-08-07 NOTE — Patient Instructions (Addendum)
Finish antibiotics as directed.   Continue on Symbicort 2 puffs twice daily, rinse after use-  Albuterol inhaler As needed   Continue on on Singulair daily Saline nasal rinses and saline gel.  Continue on CPAP At bedtime   Do not drive if sleepy  Work on healthy weight .   Follow up with Dr. Elsworth Soho  In 6 months and As needed   Please contact office for sooner follow up if symptoms do not improve or worsen or seek emergency care

## 2022-08-07 NOTE — Progress Notes (Signed)
@Patient  ID: , female    DOB: [birth year], 56 y.o.   MRN: [id number]  Chief Complaint  Patient presents with   Follow-up    Referring provider: [id number], MD  HPI: 56 year old female never smoker followed for asthma and obstructive sleep apnea  TEST/EVENTS :  01/29/2012 Spirometry showed no airway obstruction, FEV1 was 82% FVC was 79% ratio 79  PSG 2013 moderate obstructive sleep apnea - AHI 18/h   08/07/2022 Follow up ; Asthma and OSA  Patient presents for a 45-month follow-up.  Patient is followed for underlying asthma.  She remains on Symbicort twice daily.  Takes Singulair daily.  Says had Asthma bronchitic flare 1 week ago, seen by PCP called in antibiotics and steroids. Feeling better. Has couple of days left on antibiotics .  No fever, hemoptysis or chest pain .   Patient is on CPAP for her underlying sleep apnea.  Says that she wears her CPAP every single night.  Cannot sleep without it.  Typically gets in about 7 hours of sleep each night.  CPAP download shows 100% compliance.  Daily average usage at 7 hours.  Patient is on CPAP 9 cm H2O.  AHI is 2.4/hour. Feels she benefits from CPAP with decreased daytime sleepiness.  Uses nasal pillows .     No Known Allergies  Immunization History  Administered Date(s) Administered   Influenza Split 07/09/2011   Influenza Whole 07/31/2012   Influenza,inj,Quad PF,6+ Mos 07/01/2013, 07/29/2014, 08/25/2015, 06/14/2016, 06/30/2017, 08/30/2021   Influenza-Unspecified 07/27/2019   PFIZER(Purple Top)SARS-COV-2 Vaccination 01/02/2020, 01/23/2020   Tdap 07/09/2011    Past Medical History:  Diagnosis Date   Allergy    Asthma    Bronchitis    GERD (gastroesophageal reflux disease)    Sleep apnea    uses c pap     Tobacco History: Social History   Tobacco Use  Smoking Status Never  Smokeless Tobacco Never   Counseling given: Not Answered   Outpatient Medications Prior to Visit  Medication Sig Dispense  Refill   albuterol (PROVENTIL) (2.5 MG/3ML) 0.083% nebulizer solution Take 3 mLs (2.5 mg total) by nebulization every 6 (six) hours as needed for wheezing. 75 mL 12   albuterol (VENTOLIN HFA) 108 (90 Base) MCG/ACT inhaler INHALE 2 PUFFS BY MOUTH EVERY 4 HOURS AS NEEDED FOR WHEEZE OR FOR SHORTNESS OF BREATH 36 each 3   atenolol (TENORMIN) 100 MG tablet Take 100 mg by mouth daily.     cyclobenzaprine (FLEXERIL) 10 MG tablet Take 1 tablet (10 mg total) by mouth 2 (two) times daily as needed for muscle spasms. 20 tablet 0   gabapentin (NEURONTIN) 600 MG tablet Take 600 mg by mouth at bedtime.     hydrochlorothiazide (HYDRODIURIL) 12.5 MG tablet TAKE 1 TABLET (12.5 MG TOTAL) DAILY BY MOUTH. 30 tablet 3   HYDROcodone-acetaminophen (NORCO/VICODIN) 5-325 MG tablet Take 1 tablet by mouth 2 (two) times daily as needed for moderate pain.     ibuprofen (ADVIL,MOTRIN) 800 MG tablet Take 1 tablet (800 mg total) by mouth every 8 (eight) hours as needed. (Patient taking differently: Take 600 mg by mouth every 8 (eight) hours as needed.) 30 tablet 2   meloxicam (MOBIC) 7.5 MG tablet meloxicam     montelukast (SINGULAIR) 10 MG tablet TAKE 1 TABLET BY MOUTH EVERYDAY AT BEDTIME 30 tablet 3   Nebulizers (COMPRESSOR/NEBULIZER) MISC As directed 1 each 0   sulfamethoxazole-trimethoprim (BACTRIM DS) 800-160 MG tablet Take 1 tablet by mouth 2 (two) times daily.  furosemide (LASIX) 20 MG tablet Take 1 tablet (20 mg total) by mouth 2 (two) times daily as needed for up to 3 days. 6 tablet 0   SYMBICORT 160-4.5 MCG/ACT inhaler INHALE 2 PUFFS BY MOUTH INTO THE LUNGS TWICE A DAY (Patient not taking: Reported on 08/07/2022) 10.2 each 6   amLODipine (NORVASC) 10 MG tablet Take 10 mg by mouth daily. (Patient not taking: Reported on 08/07/2022)     PHENTERMINE HCL PO Take by mouth. (Patient not taking: Reported on 02/05/2022)     rosuvastatin (CRESTOR) 10 MG tablet Take 10 mg by mouth daily. (Patient not taking: Reported on  08/07/2022)     No facility-administered medications prior to visit.     Review of Systems:   Constitutional:   No  weight loss, night sweats,  Fevers, chills, fatigue, or  lassitude.  HEENT:   No headaches,  Difficulty swallowing,  Tooth/dental problems, or  Sore throat,                No sneezing, itching, ear ache,  +nasal congestion, post nasal drip,   CV:  No chest pain,  Orthopnea, PND, swelling in lower extremities, anasarca, dizziness, palpitations, syncope.   GI  No heartburn, indigestion, abdominal pain, nausea, vomiting, diarrhea, change in bowel habits, loss of appetite, bloody stools.   Resp:   No chest wall deformity  Skin: no rash or lesions.  GU: no dysuria, change in color of urine, no urgency or frequency.  No flank pain, no hematuria   MS:  No joint pain or swelling.  No decreased range of motion.  No back pain.    Physical Exam  BP 130/80 (BP Location: Left Arm, Patient Position: Sitting, Cuff Size: Large)   Pulse 78   Temp 98.5 F (36.9 C) (Oral)   Ht 5\' 5"  (1.651 m)   Wt 298 lb 12.8 oz (135.5 kg)   SpO2 99%   BMI 49.72 kg/m   GEN: A/Ox3; pleasant , NAD, well nourished    HEENT:  [place]/AT,  NOSE-clear, THROAT-clear, no lesions, no postnasal drip or exudate noted.   NECK:  Supple w/ fair ROM; no JVD; normal carotid impulses w/o bruits; no thyromegaly or nodules palpated; no lymphadenopathy.    RESP  Clear  P & A; w/o, wheezes/ rales/ or rhonchi. no accessory muscle use, no dullness to percussion  CARD:  RRR, no m/r/g, tr peripheral edema, pulses intact, no cyanosis or clubbing.  GI:   Soft & nt; nml bowel sounds; no organomegaly or masses detected.   Musco: Warm bil, no deformities or joint swelling noted.   Neuro: alert, no focal deficits noted.    Skin: Warm, no lesions or rashes    Lab Results:  CBC   BNP No results found for: "BNP"  ProBNP No results found for: "PROBNP"  Imaging: No results found.       Latest Ref Rng &  Units 01/24/2022    3:55 PM  PFT Results  FVC-Pre L 1.97   FVC-Predicted Pre % 69   FVC-Post L 2.13   FVC-Predicted Post % 74   Pre FEV1/FVC % % 84   Post FEV1/FCV % % 82   FEV1-Pre L 1.66   FEV1-Predicted Pre % 73   FEV1-Post L 1.74     No results found for: "NITRICOXIDE"      Assessment & Plan:   Asthma Asthmatic bronchitic exacerbation now improving.  Patient is to finish up antibiotics as directed  Continue with asthma  action plan  Plan  Patient Instructions  Finish antibiotics as directed.   Continue on Symbicort 2 puffs twice daily, rinse after use-  Albuterol inhaler As needed   Continue on on Singulair daily Saline nasal rinses and saline gel.  Continue on CPAP At bedtime   Do not drive if sleepy  Work on healthy weight .   Follow up with Dr. Vassie Loll  In 6 months and As needed   Please contact office for sooner follow up if symptoms do not improve or worsen or seek emergency care      Allergic rhinitis Continue current regimen  OSA (obstructive sleep apnea) Excellent control compliance on CPAP  Severe obesity (BMI >= 40) Healthy weight loss discussed     Rubye Oaks, NP 08/07/2022

## 2022-08-07 NOTE — Assessment & Plan Note (Signed)
Excellent control compliance on CPAP

## 2022-08-07 NOTE — Assessment & Plan Note (Signed)
Asthmatic bronchitic exacerbation now improving.  Patient is to finish up antibiotics as directed  Continue with asthma action plan  Plan  Patient Instructions  Finish antibiotics as directed.   Continue on Symbicort 2 puffs twice daily, rinse after use-  Albuterol inhaler As needed   Continue on on Singulair daily Saline nasal rinses and saline gel.  Continue on CPAP At bedtime   Do not drive if sleepy  Work on healthy weight .   Follow up with Dr. Elsworth Soho  In 6 months and As needed   Please contact office for sooner follow up if symptoms do not improve or worsen or seek emergency care

## 2022-08-07 NOTE — Assessment & Plan Note (Signed)
Continue current regimen

## 2022-08-07 NOTE — Assessment & Plan Note (Signed)
Healthy weight loss discussed 

## 2022-08-19 ENCOUNTER — Other Ambulatory Visit: Payer: Self-pay

## 2022-08-19 ENCOUNTER — Ambulatory Visit (INDEPENDENT_AMBULATORY_CARE_PROVIDER_SITE_OTHER): Payer: Medicare Other | Admitting: Obstetrics and Gynecology

## 2022-08-19 ENCOUNTER — Other Ambulatory Visit (HOSPITAL_COMMUNITY)
Admission: RE | Admit: 2022-08-19 | Discharge: 2022-08-19 | Disposition: A | Discharge status: Home / Self Care | Payer: Medicare Other | Source: Ambulatory Visit | Attending: Obstetrics and Gynecology | Admitting: Obstetrics and Gynecology

## 2022-08-19 ENCOUNTER — Encounter: Payer: Self-pay | Admitting: Obstetrics and Gynecology

## 2022-08-19 VITALS — BP 128/74 | HR 72 | Ht 64.57 in | Wt 308.0 lb

## 2022-08-19 DIAGNOSIS — N760 Acute vaginitis: Secondary | ICD-10-CM

## 2022-08-19 DIAGNOSIS — Z9189 Other specified personal risk factors, not elsewhere classified: Secondary | ICD-10-CM | POA: Diagnosis not present

## 2022-08-19 DIAGNOSIS — N3281 Overactive bladder: Secondary | ICD-10-CM | POA: Diagnosis not present

## 2022-08-19 DIAGNOSIS — N3941 Urge incontinence: Secondary | ICD-10-CM

## 2022-08-19 DIAGNOSIS — R350 Frequency of micturition: Secondary | ICD-10-CM

## 2022-08-19 DIAGNOSIS — B9689 Other specified bacterial agents as the cause of diseases classified elsewhere: Secondary | ICD-10-CM

## 2022-08-19 DIAGNOSIS — Z124 Encounter for screening for malignant neoplasm of cervix: Secondary | ICD-10-CM | POA: Insufficient documentation

## 2022-08-19 DIAGNOSIS — N898 Other specified noninflammatory disorders of vagina: Secondary | ICD-10-CM

## 2022-08-19 DIAGNOSIS — Z01419 Encounter for gynecological examination (general) (routine) without abnormal findings: Secondary | ICD-10-CM

## 2022-08-19 DIAGNOSIS — Z114 Encounter for screening for human immunodeficiency virus [HIV]: Secondary | ICD-10-CM

## 2022-08-19 DIAGNOSIS — Z113 Encounter for screening for infections with a predominantly sexual mode of transmission: Secondary | ICD-10-CM

## 2022-08-19 DIAGNOSIS — A599 Trichomoniasis, unspecified: Secondary | ICD-10-CM

## 2022-08-19 LAB — URINALYSIS, COMPLETE
Bilirubin Urine: NEGATIVE
Glucose, UA: NEGATIVE
Hyaline Cast: NONE SEEN /LPF
Ketones, ur: NEGATIVE
Leukocytes,Ua: NEGATIVE
Nitrite: NEGATIVE
Protein, ur: NEGATIVE
RBC / HPF: NONE SEEN /HPF (ref 0–2)
Specific Gravity, Urine: 1.015 (ref 1.001–1.035)
pH: 6 (ref 5.0–8.0)

## 2022-08-19 LAB — WET PREP FOR TRICH, YEAST, CLUE

## 2022-08-19 MED ORDER — METRONIDAZOLE 500 MG PO TABS: 500.0000 mg | ORAL_TABLET | Freq: Two times a day (BID) | ORAL | 0 refills | Status: DC

## 2022-08-19 MED ORDER — OXYBUTYNIN CHLORIDE ER 5 MG PO TB24: 5.0000 mg | ORAL_TABLET | Freq: Every day | ORAL | 1 refills | Status: DC

## 2022-08-19 NOTE — Patient Instructions (Addendum)
Trichomoniasis Trichomoniasis is a sexually transmitted infection (STI). Many people with trichomoniasis do not have any symptoms (are asymptomatic) or have only minimal symptoms. Untreated trichomoniasis can last from months to years. This condition is treated with medicine. What are the causes? This condition is caused by a parasite called Trichomonas vaginalis and is transmitted during sexual contact. What increases the risk? The following factors may make you more likely to develop this condition: Having unprotected sex. Having sex with a partner who has trichomoniasis. Having multiple sexual partners. Having had previous trichomoniasis infections or other STIs. What are the signs or symptoms? In females, symptoms of trichomoniasis include: Itching, burning, redness, or soreness in the genital area. Discomfort while urinating. Abnormal vaginal discharge that is clear, white, gray, or yellow-green and foamy and has an unusual fishy odor. In males, symptoms of trichomoniasis include: Discharge from the penis. Burning after urination or ejaculation. Itching or discomfort inside the penis. How is this diagnosed? This condition is diagnosed based on tests. To perform a test, your health care provider Geiger do one of the following: Ask you to provide a urine sample. Take a sample of discharge. The sample may be taken from the vagina or cervix in females and from the urethra in males. Your health care provider may use a swab to collect the sample. Your health care provider may test you for other STIs, including human immunodeficiency virus (HIV). How is this treated?  This condition is treated with medicines such as metronidazole or tinidazole. These are called antimicrobial medicines, and they are taken by mouth (orally). Your sexual partner or partners also need to be tested and treated. If they have the infection and are not treated, you Manfredi likely get reinfected. If you plan to become  pregnant or think you may be pregnant, tell your health care provider right away. Some medicines that are used to treat the infection should not be taken during pregnancy. Your health care provider may recommend over-the-counter medicines or creams to help relieve itching or irritation. You may be tested for the infection again 3 months after treatment. Follow these instructions at home: Medicines Take over-the-counter and prescription medicines only as told by your health care provider. Take your antimicrobial medicine as told by your health care provider. Do not stop taking it even if you start to feel better. Use creams as told by your health care provider. General instructions Do not have sex until after you finish your medicine and your symptoms have resolved. Do not wear tampons while you have the infection (if you are female). Talk with your sexual partner or partners about any symptoms that either of you may have, as well as any history of STIs. Keep all follow-up visits. This is important. How is this prevented?  Use condoms every time you have sex. Using condoms correctly and consistently can help protect against STIs. Do not have sexual contact if you have symptoms of trichomoniasis or another STI. Avoid having multiple sexual partners. Get tested for STIs before you have sex with a partner. Ask all partners to do the same. Do not douche (if you are female). Douching may increase your risk for getting STIs due to the removal of good bacteria in the vagina. Contact a health care provider if: You still have symptoms after you finish your medicine. You develop a rash. You plan to become pregnant or think you may be pregnant. Summary Trichomoniasis is a sexually transmitted infection (STI). This condition often has no symptoms or minimal   symptoms. Take your antimicrobial medicine as told by your health care provider. Do not stop taking even if you start to feel better. Discuss your  infection with your sexual partner or partners. Make sure that all partners get tested and treated, if necessary. You should not have sex until after you finish your medicine and your symptoms have resolved. Keep all follow-up visits. This is important. This information is not intended to replace advice given to you by your health care provider. Make sure you discuss any questions you have with your health care provider. Document Revised: 08/29/2021 Document Reviewed: 08/29/2021 Elsevier Patient Education  2023 Elsevier Inc.    Kegel Exercises  Kegel exercises can help strengthen your pelvic floor muscles. The pelvic floor is a group of muscles that support your rectum, small intestine, and bladder. In females, pelvic floor muscles also help support the uterus. These muscles help you control the flow of urine and stool (feces). Kegel exercises are painless and simple. They do not require any equipment. Your provider may suggest Kegel exercises to: Improve bladder and bowel control. Improve sexual response. Improve weak pelvic floor muscles after surgery to remove the uterus (hysterectomy) or after pregnancy, in females. Improve weak pelvic floor muscles after prostate gland removal or surgery, in males. Kegel exercises involve squeezing your pelvic floor muscles. These are the same muscles you squeeze when you try to stop the flow of urine or keep from passing gas. The exercises can be done while sitting, standing, or lying down, but it is best to vary your position. Ask your health care provider which exercises are safe for you. Do exercises exactly as told by your health care provider and adjust them as directed. Do not begin these exercises until told by your health care provider. Exercises How to do Kegel exercises: Squeeze your pelvic floor muscles tight. You should feel a tight lift in your rectal area. If you are a female, you should also feel a tightness in your vaginal area. Keep your  stomach, buttocks, and legs relaxed. Hold the muscles tight for up to 10 seconds. Breathe normally. Relax your muscles for up to 10 seconds. Repeat as told by your health care provider. Repeat this exercise daily as told by your health care provider. Continue to do this exercise for at least 4-6 weeks, or for as long as told by your health care provider. You may be referred to a physical therapist who can help you learn more about how to do Kegel exercises. Depending on your condition, your health care provider may recommend: Varying how long you squeeze your muscles. Doing several sets of exercises every day. Doing exercises for several weeks. Making Kegel exercises a part of your regular exercise routine. This information is not intended to replace advice given to you by your health care provider. Make sure you discuss any questions you have with your health care provider. Document Revised: 02/08/2021 Document Reviewed: 02/08/2021 Elsevier Patient Education  2023 Elsevier Inc.   EXERCISE   We recommended that you start or continue a regular exercise program for good health. Physical activity is anything that gets your body moving, some is better than none. The CDC recommends 150 minutes per week of Moderate-Intensity Aerobic Activity and 2 or more days of Muscle Strengthening Activity.  Benefits of exercise are limitless: helps weight loss/weight maintenance, improves mood and energy, helps with depression and anxiety, improves sleep, tones and strengthens muscles, improves balance, improves bone density, protects from chronic conditions such as heart  disease, high blood pressure and diabetes and so much more. To learn more visit: http://kirby-bean.org/  DIET: Good nutrition starts with a healthy diet of fruits, vegetables, whole grains, and lean protein sources. Drink plenty of water for hydration. Minimize empty calories, sodium, sweets. For more information about  dietary recommendations visit: CriticalGas.be and https://www.carpenter-henry.info/  ALCOHOL:  Women should limit their alcohol intake to no more than 7 drinks/beers/glasses of wine (combined, not each!) per week. Moderation of alcohol intake to this level decreases your risk of breast cancer and liver damage.  If you are concerned that you may have a problem, or your friends have told you they are concerned about your drinking, there are many resources to help. A well-known program that is free, effective, and available to all people all over the nation is Alcoholics Anonymous.  Check out this site to learn more: BeverageBargains.co.za   CALCIUM AND VITAMIN D:  Adequate intake of calcium and Vitamin D are recommended for bone health.  You should be getting between 1000-1200 mg of calcium and 800 units of Vitamin D daily between diet and supplements  MAMMOGRAMS:  All women over 63 years old should have a routine mammogram.   COLON CANCER SCREENING: Now recommend starting at age 22. At this time colonoscopy is not covered for routine screening until 50. There are take home tests that can be done between 45-49.   COLONOSCOPY:  Colonoscopy to screen for colon cancer is recommended for all women at age 74.  We know, you hate the idea of the prep.  We agree, BUT, having colon cancer and not knowing it is worse!!  Colon cancer so often starts as a polyp that can be seen and removed at colonscopy, which can quite literally save your life!  And if your first colonoscopy is normal and you have no family history of colon cancer, most women don't have to have it again for 10 years.  Once every ten years, you can do something that may end up saving your life, right?  We will be happy to help you get it scheduled when you are ready.  Be sure to check your insurance coverage so you understand how much it will cost.  It may be covered as a preventative service at no cost,  but you should check your particular policy.      Breast Self-Awareness Breast self-awareness means being familiar with how your breasts look and feel. It involves checking your breasts regularly and reporting any changes to your health care provider. Practicing breast self-awareness is important. A change in your breasts can be a sign of a serious medical problem. Being familiar with how your breasts look and feel allows you to find any problems early, when treatment is more likely to be successful. All women should practice breast self-awareness, including women who have had breast implants. How to do a breast self-exam One way to learn what is normal for your breasts and whether your breasts are changing is to do a breast self-exam. To do a breast self-exam: Look for Changes  Remove all the clothing above your waist. Stand in front of a mirror in a room with good lighting. Put your hands on your hips. Push your hands firmly downward. Compare your breasts in the mirror. Look for differences between them (asymmetry), such as: Differences in shape. Differences in size. Puckers, dips, and bumps in one breast and not the other. Look at each breast for changes in your skin, such as: Redness. Scaly  areas. Look for changes in your nipples, such as: Discharge. Bleeding. Dimpling. Redness. A change in position. Feel for Changes Carefully feel your breasts for lumps and changes. It is best to do this while lying on your back on the floor and again while sitting or standing in the shower or tub with soapy water on your skin. Feel each breast in the following way: Place the arm on the side of the breast you are examining above your head. Feel your breast with the other hand. Start in the nipple area and make  inch (2 cm) overlapping circles to feel your breast. Use the pads of your three middle fingers to do this. Apply light pressure, then medium pressure, then firm pressure. The light  pressure will allow you to feel the tissue closest to the skin. The medium pressure will allow you to feel the tissue that is a little deeper. The firm pressure will allow you to feel the tissue close to the ribs. Continue the overlapping circles, moving downward over the breast until you feel your ribs below your breast. Move one finger-width toward the center of the body. Continue to use the  inch (2 cm) overlapping circles to feel your breast as you move slowly up toward your collarbone. Continue the up and down exam using all three pressures until you reach your armpit.  Write Down What You Find  Write down what is normal for each breast and any changes that you find. Keep a written record with breast changes or normal findings for each breast. By writing this information down, you do not need to depend only on memory for size, tenderness, or location. Write down where you are in your menstrual cycle, if you are still menstruating. If you are having trouble noticing differences in your breasts, do not get discouraged. With time you will become more familiar with the variations in your breasts and more comfortable with the exam. How often should I examine my breasts? Examine your breasts every month. If you are breastfeeding, the best time to examine your breasts is after a feeding or after using a breast pump. If you menstruate, the best time to examine your breasts is 5-7 days after your period is over. During your period, your breasts are lumpier, and it may be more difficult to notice changes. When should I see my health care provider? See your health care provider if you notice: A change in shape or size of your breasts or nipples. A change in the skin of your breast or nipples, such as a reddened or scaly area. Unusual discharge from your nipples. A lump or thick area that was not there before. Pain in your breasts. Anything that concerns you.

## 2022-08-19 NOTE — Progress Notes (Signed)
56 y.o. No obstetric history on file. Married Black or [place] American Not Hispanic or Latino female here for a breast and pelvic exam.     She has some urinary frequency, urgency and urinary incontinence (urge). Voids small to large amounts. Leaks urine daily, can be large amounts. She is on HCTZ.  She drinks decaffeinated coffee.   She has a hx of trichomonas 1-2 years ago. She would like STD testing to be safe. She has only been with her husband and he denies other partners.     No LMP recorded. (Menstrual status: Perimenopausal).          Sexually active: Yes.    The current method of family planning is post menopausal status.    Exercising: Yes.     Walking  Smoker:  no  Health Maintenance: Pap:  11/07/15 WNL Hr HPV Neg  History of abnormal Pap:  yes no surgery on her cervix  MMG:  06/11/22 density B Bi-rads 1 neg  BMD:   none  Colonoscopy: 10/19/15 normal f/u 10 years  TDaP:  07/09/11 Gardasil: n/a   reports that she has never smoked. She has never used smokeless tobacco. She reports current alcohol use. She reports that she does not use drugs. She is on disability. Son is 65, daughter is 8. 3 grandchildren. Everyone is local.   Past Medical History:  Diagnosis Date   Allergy    Asthma    Bronchitis    GERD (gastroesophageal reflux disease)    Sleep apnea    uses c pap     Past Surgical History:  Procedure Laterality Date   DENTAL SURGERY     ECTOPIC PREGNANCY SURGERY  2006    Current Outpatient Medications  Medication Sig Dispense Refill   albuterol (PROVENTIL) (2.5 MG/3ML) 0.083% nebulizer solution Take 3 mLs (2.5 mg total) by nebulization every 6 (six) hours as needed for wheezing. 75 mL 12   albuterol (VENTOLIN HFA) 108 (90 Base) MCG/ACT inhaler INHALE 2 PUFFS BY MOUTH EVERY 4 HOURS AS NEEDED FOR WHEEZE OR FOR SHORTNESS OF BREATH 36 each 3   atenolol (TENORMIN) 100 MG tablet Take 100 mg by mouth daily.     cyclobenzaprine (FLEXERIL) 10 MG tablet Take 1 tablet (10  mg total) by mouth 2 (two) times daily as needed for muscle spasms. 20 tablet 0   furosemide (LASIX) 20 MG tablet Take 1 tablet (20 mg total) by mouth 2 (two) times daily as needed for up to 3 days. 6 tablet 0   gabapentin (NEURONTIN) 600 MG tablet Take 600 mg by mouth at bedtime.     hydrochlorothiazide (HYDRODIURIL) 12.5 MG tablet TAKE 1 TABLET (12.5 MG TOTAL) DAILY BY MOUTH. 30 tablet 3   HYDROcodone-acetaminophen (NORCO/VICODIN) 5-325 MG tablet Take 1 tablet by mouth 2 (two) times daily as needed for moderate pain.     ibuprofen (ADVIL,MOTRIN) 800 MG tablet Take 1 tablet (800 mg total) by mouth every 8 (eight) hours as needed. (Patient taking differently: Take 600 mg by mouth every 8 (eight) hours as needed.) 30 tablet 2   meloxicam (MOBIC) 7.5 MG tablet meloxicam     montelukast (SINGULAIR) 10 MG tablet TAKE 1 TABLET BY MOUTH EVERYDAY AT BEDTIME 30 tablet 3   Nebulizers (COMPRESSOR/NEBULIZER) MISC As directed 1 each 0   sulfamethoxazole-trimethoprim (BACTRIM DS) 800-160 MG tablet Take 1 tablet by mouth 2 (two) times daily.     SYMBICORT 160-4.5 MCG/ACT inhaler INHALE 2 PUFFS BY MOUTH INTO THE LUNGS TWICE  A DAY (Patient not taking: Reported on 08/07/2022) 10.2 each 6   No current facility-administered medications for this visit.    Family History  Problem Relation Age of Onset   Hypertension Father    Hypertension Sister    Stroke Paternal Grandfather    Hypertension Paternal Grandfather    Hypertension Sister    Colon cancer Neg Hx    Colon polyps Neg Hx    Esophageal cancer Neg Hx    Rectal cancer Neg Hx    Stomach cancer Neg Hx    Breast cancer Neg Hx     Review of Systems  Genitourinary:  Positive for frequency.  All other systems reviewed and are negative.   Exam:   There were no vitals taken for this visit.  Weight change: @WEIGHTCHANGE @ Height:      Ht Readings from Last 3 Encounters:  08/07/22 5\' 5"  (1.651 m)  02/05/22 5\' 5"  (1.651 m)  08/30/21 5\' 5"  (1.651 m)     General appearance: alert, cooperative and appears stated age Head: Normocephalic, without obvious abnormality, atraumatic Neck: no adenopathy, supple, symmetrical, trachea midline and thyroid normal to inspection and palpation Breasts: normal appearance, no masses or tenderness Abdomen: soft, non-tender; non distended,  no masses,  no organomegaly Extremities: extremities normal, atraumatic, no cyanosis or edema Skin: Skin color, texture, turgor normal. No rashes or lesions Lymph nodes: Cervical, supraclavicular, and axillary nodes normal. No abnormal inguinal nodes palpated Neurologic: Grossly normal   Pelvic: External genitalia:  no lesions              Urethra:  normal appearing urethra with no masses, tenderness or lesions              Bartholins and Skenes: normal                 Vagina: normal appearing vagina with an increase in thick, white vaginal discharge              Cervix: no lesions               Bimanual Exam:  Uterus:   no masses or tenderness              Adnexa: no mass, fullness, tenderness               Rectovaginal: Confirms               Anus:  normal sphincter tone, no lesions  Gae Dry chaperoned for the exam.  1. Encounter for breast and pelvic examination Discussed breast self exam Discussed calcium and vit D intake Mammogram and colonoscopy UTD Labs and immunizations with primary  2. Overactive bladder Given information on bladder training and kegels - Urinalysis, Complete - Urine Culture  3. Urinary frequency - Urinalysis, Complete - Urine Culture  4. Urge incontinence - Urinalysis, Complete - Urine Culture - oxybutynin (DITROPAN-XL) 5 MG 24 hr tablet; Take 1 tablet (5 mg total) by mouth at bedtime.  Dispense: 30 tablet; Refill: 1  5. Screening examination for STD (sexually transmitted disease) - SURESWAB CT/NG/T. vaginalis - RPR - HIV Antibody (routine testing w rflx)  6. Screening for cervical cancer - Cytology - PAP  7.  Screening for HIV (human immunodeficiency virus) - HIV Antibody (routine testing w rflx)  8. Vaginal discharge - WET PREP FOR TRICH, YEAST, CLUE  9. BV (bacterial vaginosis) - metroNIDAZOLE (FLAGYL) 500 MG tablet; Take 1 tablet (500 mg total) by mouth 2 (two) times daily.  Dispense: 14 tablet; Refill: 0

## 2022-08-20 LAB — URINE CULTURE
MICRO NUMBER:: 14148637
Result:: NO GROWTH
SPECIMEN QUALITY:: ADEQUATE

## 2022-08-20 LAB — CYTOLOGY - PAP: Diagnosis: NEGATIVE

## 2022-08-20 LAB — SURESWAB CT/NG/T. VAGINALIS
C. trachomatis RNA, TMA: NOT DETECTED
N. gonorrhoeae RNA, TMA: NOT DETECTED
Trichomonas vaginalis RNA: NOT DETECTED

## 2022-08-20 LAB — HIV ANTIBODY (ROUTINE TESTING W REFLEX): HIV 1&2 Ab, 4th Generation: NONREACTIVE

## 2022-08-20 LAB — RPR: RPR Ser Ql: NONREACTIVE

## 2022-09-02 ENCOUNTER — Other Ambulatory Visit: Payer: Self-pay | Admitting: Obstetrics and Gynecology

## 2022-09-02 DIAGNOSIS — N3941 Urge incontinence: Secondary | ICD-10-CM

## 2022-09-03 NOTE — Telephone Encounter (Signed)
Bienville Callas with pt to confirm that JJ typically does not prescribe this medication at first with 90day supply if its their first time taking it. Typically we want to see how well they take it first. However, pt reports that she has already notified us that she doesn't not wish to take medication so will refuse this request.

## 2022-09-13 ENCOUNTER — Ambulatory Visit: Payer: Medicare Other

## 2022-09-13 ENCOUNTER — Ambulatory Visit
Admission: RE | Admit: 2022-09-13 | Discharge: 2022-09-13 | Disposition: A | Discharge status: Home / Self Care | Payer: Medicare Other | Source: Ambulatory Visit | Attending: Internal Medicine | Admitting: Internal Medicine

## 2022-09-13 ENCOUNTER — Ambulatory Visit (INDEPENDENT_AMBULATORY_CARE_PROVIDER_SITE_OTHER): Payer: Medicare Other

## 2022-09-13 VITALS — BP 140/77 | HR 73 | Temp 99.0°F | Resp 18

## 2022-09-13 DIAGNOSIS — M25552 Pain in left hip: Secondary | ICD-10-CM | POA: Diagnosis not present

## 2022-09-13 DIAGNOSIS — M545 Low back pain, unspecified: Secondary | ICD-10-CM | POA: Diagnosis not present

## 2022-09-13 DIAGNOSIS — W19XXXA Unspecified fall, initial encounter: Secondary | ICD-10-CM

## 2022-09-13 NOTE — Discharge Instructions (Signed)
Your x-ray did not show any fracture or dislocation.  Recommend ice application and the pain relievers that you have at home.  Please follow-up with your orthopedic specialist if symptoms persist or worsen.

## 2022-09-13 NOTE — ED Provider Notes (Signed)
[facility]    CSN: [id number] Arrival date & time: 09/13/22  1357      History   Chief Complaint Chief Complaint  Patient presents with   Hip Pain    HPI [name] is a 56 y.o. female.   Patient presents with fall that occurred on 08/31/2022.  Patient reports that she was getting up off the toilet when she lost her balance and fell backwards landing on her buttocks/left lower back.  Patient reports that she does have a history of chronic back pain.  Patient has taken meloxicam that she has for chronic pain with minimal improvement.  Movement exacerbates pain.  She walks with a cane at baseline given history of chronic back pain.  Denies urinary frequency, urinary or bowel incontinence, saddle anesthesia.  Denies hitting head or losing consciousness during fall.  Patient does not take any blood thinning medications.   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   Porokeratosis 12/16/2014   Pain of left heel 12/05/2014   Severe obesity (BMI >= 40) (HCC) 12/15/2013   Other dysphagia 12/15/2013   Edema 07/22/2012   Visual changes 07/22/2012   Seasonal allergic rhinitis 12/16/2011   Asthma 11/13/2011   General medical examination 07/09/2011   Exposure to hepatitis B 07/09/2011   OSA (obstructive sleep apnea) 07/09/2011   Back pain 07/09/2011    Past Surgical History:  Procedure Laterality Date   DENTAL SURGERY     ECTOPIC PREGNANCY SURGERY  2006     OB History     Gravida  3   Para  2   Term  2   Preterm      AB  1   Living         SAB      IAB      Ectopic  1   Multiple      Live Births               Home Medications    Prior to Admission medications   Medication Sig Start Date End Date Taking? Authorizing Provider  albuterol (PROVENTIL) (2.5 MG/3ML) 0.083% nebulizer solution Take 3 mLs (2.5 mg total) by nebulization every 6 (six) hours as needed for wheezing. 06/06/20 12/13/24  Oretha MilchAlva, Rakesh V, MD  albuterol (VENTOLIN HFA) 108 (90 Base) MCG/ACT inhaler INHALE 2 PUFFS BY MOUTH EVERY 4 HOURS AS NEEDED FOR WHEEZE OR FOR SHORTNESS OF BREATH 08/07/21   Oretha MilchAlva, Rakesh V, MD  atenolol (TENORMIN) 100 MG tablet Take 100 mg by mouth daily.    [provider]  cyclobenzaprine (FLEXERIL) 10 MG tablet Take 1 tablet (10 mg total) by mouth 2 (two) times daily as needed for muscle spasms. 07/08/18   Wieters, Hallie C, PA-C  furosemide (LASIX) 20 MG tablet Take 1 tablet (20 mg total) by mouth 2 (two) times daily as needed for up to 3 days. 01/17/20  01/20/20  Hall-Potvin, [place], PA-C  gabapentin (NEURONTIN) 600 MG tablet Take 600 mg by mouth at bedtime.    [provider]  hydrochlorothiazide (HYDRODIURIL) 12.5 MG tablet TAKE 1 TABLET (12.5 MG TOTAL) DAILY BY MOUTH. 12/22/17   Sheliah Hatch, MD  HYDROcodone-acetaminophen (NORCO/VICODIN) 5-325 MG tablet Take 1 tablet by mouth 2 (two) times daily as needed for moderate pain.    [provider]  ibuprofen (ADVIL,MOTRIN) 800 MG tablet Take 1 tablet (800 mg total) by mouth every 8 (eight) hours as needed. Patient taking differently: Take 600 mg by mouth every 8 (eight) hours as needed. 01/13/18   Tarry Kos, MD  meloxicam (MOBIC) 7.5 MG tablet meloxicam    [provider]  metroNIDAZOLE (FLAGYL) 500 MG tablet Take 1 tablet (500 mg total) by mouth 2 (two) times daily. 08/19/22   Romualdo Bolk, MD  montelukast (SINGULAIR) 10 MG tablet TAKE 1  TABLET BY MOUTH EVERYDAY AT BEDTIME 09/19/17   Oretha Milch, MD  Nebulizers (COMPRESSOR/NEBULIZER) MISC As directed 09/20/11   Zola Button, Grayling [place], DO  oxybutynin (DITROPAN-XL) 5 MG 24 hr tablet Take 1 tablet (5 mg total) by mouth at bedtime. 08/19/22   Romualdo Bolk, MD  SYMBICORT 160-4.5 MCG/ACT inhaler INHALE 2 PUFFS BY MOUTH INTO THE LUNGS TWICE A DAY 03/12/22   Oretha Milch, MD    Family History Family History  Problem Relation Age of Onset   Hypertension Father    Hypertension Sister    Stroke Paternal Grandfather    Hypertension Paternal Grandfather    Hypertension Sister    Colon cancer Neg Hx    Colon polyps Neg Hx    Esophageal cancer Neg Hx    Rectal cancer Neg Hx    Stomach cancer Neg Hx    Breast cancer Neg Hx     Social History Social History   Tobacco Use   Smoking status: Never   Smokeless tobacco: Never  Vaping Use   Vaping Use: Never used  Substance Use Topics   Alcohol use: Yes    Alcohol/week: 0.0 standard drinks of alcohol    Comment: rare   Drug use: No     Allergies   Patient has no known allergies.   Review of Systems Review of Systems Per HPI  Physical Exam Triage Vital Signs ED Triage Vitals  Enc Vitals Group     BP 09/13/22 1417 (!) 140/77     Pulse Rate 09/13/22 1414 (!) 104     Resp 09/13/22 1414 18     Temp 09/13/22 1414 99 F (37.2 C)     Temp src --      SpO2 09/13/22 1414 95 %     Weight --      Height --      Head Circumference --      Peak Flow --      Pain Score 09/13/22 1413 7     Pain Loc --      Pain Edu? --      Excl. in GC? --    No data found.  Updated Vital Signs BP (!) 140/77   Pulse 73   Temp 99 F (37.2 C)   Resp 18   LMP  (LMP Unknown)   SpO2 95%   Visual Acuity Right Eye Distance:   Left Eye Distance:   Bilateral Distance:    Right Eye Near:   Left Eye Near:    Bilateral Near:  Physical Exam Constitutional:      General: She is not in acute distress.    Appearance:  Normal appearance. She is not toxic-appearing or diaphoretic.  HENT:     Head: Normocephalic and atraumatic.  Eyes:     Extraocular Movements: Extraocular movements intact.     Conjunctiva/sclera: Conjunctivae normal.  Pulmonary:     Effort: Pulmonary effort is normal.  Musculoskeletal:       Back:     Comments: Tenderness to palpation to left lower back.  No obvious direct spinal tenderness, crepitus, step-off noted.  No lacerations, abrasions, swelling, discoloration noted.  Patient can bear weight.  Neurological:     General: No focal deficit present.     Mental Status: She is alert and oriented to person, place, and time. Mental status is at baseline.     Deep Tendon Reflexes: Reflexes are normal and symmetric.  Psychiatric:        Mood and Affect: Mood normal.        Behavior: Behavior normal.        Thought Content: Thought content normal.        Judgment: Judgment normal.      UC Treatments / Results  Labs (all labs ordered are listed, but only abnormal results are displayed) Labs Reviewed - No data to display  EKG   Radiology DG Pelvis 1-2 Views  Result Date: 09/13/2022 CLINICAL DATA:  Larey Seat 08/31/2022, left-sided back and hip pain EXAM: PELVIS - 1-2 VIEW COMPARISON:  None Available. FINDINGS: Single frontal view of the pelvis includes both hips. No acute fracture, subluxation, or dislocation. Mild bilateral hip osteoarthritis, right greater than left. Sacroiliac joints are unremarkable. Lower lumbar spondylosis. Soft tissues are unremarkable. IMPRESSION: 1. No acute pelvic fracture. 2. Degenerative changes of the lumbar spine and bilateral hips. Electronically Signed   By: Sharlet Salina M.D.   On: 09/13/2022 15:22   DG Lumbar Spine Complete  Result Date: 09/13/2022 CLINICAL DATA:  Fall. EXAM: LUMBAR SPINE - COMPLETE 4+ VIEW COMPARISON:  Lumbar spine x-ray 01/16/2015 FINDINGS: There is mild levoconvex curvature of the lumbar spine centered at L2 which is similar to  prior. Alignment is anatomic. No fractures are seen. There is mild disc space narrowing at L3-L4 and L4-L5 which is new from prior. Minimal degenerative endplate osteophytes are seen throughout the lumbar spine. There is increasing facet arthropathy at L3-L4, L4-L5 and L5-S1. Soft tissues are within normal limits. IMPRESSION: 1. No acute fracture or malalignment. 2. Mild degenerative changes of the lumbar spine, progressed from prior exam. Electronically Signed   By: Darliss Cheney M.D.   On: 09/13/2022 15:21    Procedures Procedures (including critical care time)  Medications Ordered in UC Medications - No data to display  Initial Impression / Assessment and Plan / UC Course  I have reviewed the triage vital signs and the nursing notes.  Pertinent labs & imaging results that were available during my care of the patient were reviewed by me and considered in my medical decision making (see chart for details).     X-rays were negative for any acute bony abnormality.  Suspect contusions related to mechanism of fall.  Did discussed with patient chronic changes on x-ray and advised to follow-up with established orthopedist for further evaluation and management.  Patient already has Norco, 600 mg ibuprofen, meloxicam at home for pain.  Advised patient to not take ibuprofen and meloxicam at the same time.  Discussed ice application as well.  Discussed return  precautions.  Patient verbalized understanding and was agreeable with plan. Final Clinical Impressions(s) / UC Diagnoses   Final diagnoses:  Fall, initial encounter  Acute left-sided low back pain without sciatica     Discharge Instructions      Your x-ray did not show any fracture or dislocation.  Recommend ice application and the pain relievers that you have at home.  Please follow-up with your orthopedic specialist if symptoms persist or worsen.    ED Prescriptions   None    I have reviewed the PDMP during this encounter.   Gustavus Bryant, [place] 09/13/22 1600

## 2022-09-13 NOTE — ED Triage Notes (Addendum)
Pt presents to uc with co of fall on left side on 11/18 and has had left sided back pain since then. L sided hip hurts the most. Pain is 7/ 10.  Pt denies any dysuria. Pt reports pmh  of sciatica on right side.

## 2022-09-16 ENCOUNTER — Ambulatory Visit: Payer: Medicare Other | Admitting: Obstetrics and Gynecology

## 2022-12-16 DIAGNOSIS — I83893 Varicose veins of bilateral lower extremities with other complications: Secondary | ICD-10-CM | POA: Diagnosis not present

## 2022-12-24 DIAGNOSIS — M79604 Pain in right leg: Secondary | ICD-10-CM | POA: Diagnosis not present

## 2022-12-24 DIAGNOSIS — M79662 Pain in left lower leg: Secondary | ICD-10-CM | POA: Diagnosis not present

## 2022-12-24 DIAGNOSIS — M79661 Pain in right lower leg: Secondary | ICD-10-CM | POA: Diagnosis not present

## 2022-12-24 DIAGNOSIS — I83893 Varicose veins of bilateral lower extremities with other complications: Secondary | ICD-10-CM | POA: Diagnosis not present

## 2023-01-28 ENCOUNTER — Emergency Department (HOSPITAL_COMMUNITY)
Admission: EM | Admit: 2023-01-28 | Discharge: 2023-01-28 | Disposition: A | Discharge status: Home / Self Care | Payer: Medicare HMO | Attending: Emergency Medicine | Admitting: Emergency Medicine

## 2023-01-28 ENCOUNTER — Emergency Department (HOSPITAL_COMMUNITY): Payer: Medicare HMO

## 2023-01-28 ENCOUNTER — Other Ambulatory Visit: Payer: Self-pay

## 2023-01-28 DIAGNOSIS — Z7984 Long term (current) use of oral hypoglycemic drugs: Secondary | ICD-10-CM | POA: Insufficient documentation

## 2023-01-28 DIAGNOSIS — E1165 Type 2 diabetes mellitus with hyperglycemia: Secondary | ICD-10-CM | POA: Diagnosis not present

## 2023-01-28 DIAGNOSIS — R739 Hyperglycemia, unspecified: Secondary | ICD-10-CM | POA: Diagnosis not present

## 2023-01-28 DIAGNOSIS — Z7951 Long term (current) use of inhaled steroids: Secondary | ICD-10-CM | POA: Diagnosis not present

## 2023-01-28 DIAGNOSIS — R9431 Abnormal electrocardiogram [ECG] [EKG]: Secondary | ICD-10-CM | POA: Diagnosis not present

## 2023-01-28 DIAGNOSIS — J45909 Unspecified asthma, uncomplicated: Secondary | ICD-10-CM | POA: Diagnosis not present

## 2023-01-28 DIAGNOSIS — R002 Palpitations: Secondary | ICD-10-CM | POA: Diagnosis not present

## 2023-01-28 DIAGNOSIS — E119 Type 2 diabetes mellitus without complications: Secondary | ICD-10-CM

## 2023-01-28 LAB — URINALYSIS, ROUTINE W REFLEX MICROSCOPIC
Bacteria, UA: NONE SEEN
Bilirubin Urine: NEGATIVE
Glucose, UA: >=500 mg/dL — AB
Hgb urine dipstick: NEGATIVE
Ketones, ur: 20 mg/dL — AB
Leukocytes,Ua: NEGATIVE
Nitrite: NEGATIVE
Protein, ur: NEGATIVE mg/dL
Specific Gravity, Urine: 1.027 (ref 1.005–1.030)
pH: 7 (ref 5.0–8.0)

## 2023-01-28 LAB — BASIC METABOLIC PANEL
Anion gap: 14 (ref 5–15)
BUN: 9 mg/dL (ref 6–20)
CO2: 27 mmol/L (ref 22–32)
Calcium: 9.7 mg/dL (ref 8.9–10.3)
Chloride: 92 mmol/L — ABNORMAL LOW (ref 98–111)
Creatinine, Ser: 0.68 mg/dL (ref 0.44–1.00)
GFR, Estimated: >60 mL/min (ref >60)
Glucose, Bld: 397 mg/dL — ABNORMAL HIGH (ref 70–99)
Potassium: 4.2 mmol/L (ref 3.5–5.1)
Sodium: 133 mmol/L — ABNORMAL LOW (ref 135–145)

## 2023-01-28 LAB — I-STAT VENOUS BLOOD GAS, ED
Acid-Base Excess: 5 mmol/L — ABNORMAL HIGH (ref 0.0–2.0)
Bicarbonate: 32.5 mmol/L — ABNORMAL HIGH (ref 20.0–28.0)
Calcium, Ion: 1.2 mmol/L (ref 1.15–1.40)
HCT: 48 % — ABNORMAL HIGH (ref 36.0–46.0)
Hemoglobin: 16.3 g/dL — ABNORMAL HIGH (ref 12.0–15.0)
O2 Saturation: 47 %
Potassium: 4 mmol/L (ref 3.5–5.1)
Sodium: 135 mmol/L (ref 135–145)
TCO2: 34 mmol/L — ABNORMAL HIGH (ref 22–32)
pCO2, Ven: 56.9 mmHg (ref 44–60)
pH, Ven: 7.364 (ref 7.25–7.43)
pO2, Ven: 27 mmHg — CL (ref 32–45)

## 2023-01-28 LAB — CBG MONITORING, ED
Glucose-Capillary: 386 mg/dL — ABNORMAL HIGH (ref 70–99)
Glucose-Capillary: 293 mg/dL — ABNORMAL HIGH (ref 70–99)

## 2023-01-28 LAB — CBC
HCT: 44.9 % (ref 36.0–46.0)
Hemoglobin: 14.7 g/dL (ref 12.0–15.0)
MCH: 30.9 pg (ref 26.0–34.0)
MCHC: 32.7 g/dL (ref 30.0–36.0)
MCV: 94.3 fL (ref 80.0–100.0)
Platelets: 295 10*3/uL (ref 150–400)
RBC: 4.76 MIL/uL (ref 3.87–5.11)
RDW: 12.4 % (ref 11.5–15.5)
WBC: 6.6 10*3/uL (ref 4.0–10.5)
nRBC: 0 % (ref 0.0–0.2)

## 2023-01-28 LAB — HEMOGLOBIN A1C: Hemoglobin A1C: 14.2

## 2023-01-28 LAB — BETA-HYDROXYBUTYRIC ACID: Beta-Hydroxybutyric Acid: 0.75 mmol/L — ABNORMAL HIGH (ref 0.05–0.27)

## 2023-01-28 MED ORDER — SODIUM CHLORIDE 0.9 % IV BOLUS
1000.0000 mL | Freq: Once | INTRAVENOUS | Status: AC
  Administered 2023-01-28: 1000 mL via INTRAVENOUS

## 2023-01-28 MED ORDER — METFORMIN HCL 500 MG PO TABS: 500.0000 mg | ORAL_TABLET | Freq: Every day | ORAL | 0 refills | Status: DC

## 2023-01-28 NOTE — ED Triage Notes (Signed)
Pt reports two weeks of polyuria, and polydipsia.Went to UC today and was told she has diabetes, so was advised to come to the ED. A1C at UC was 14.2 and CBG there >500.

## 2023-01-28 NOTE — Discharge Instructions (Addendum)
You are seen in the emergency department today for elevated blood glucose readings.    Your levels today in combination with an A1c of 14.2 from urgent care, equates to a formal diagnosis of diabetes.  Your blood sugar was starting to come down well, and we gave you some IV fluids to help with this as well.  I am starting you on a medication called metformin.  You will start taking this once daily.  It is incredibly important that you follow-up with your primary care doctor about this, as you will need to start checking her blood sugar at home, and they may make alterations to your medication regimen.  Continue to monitor how you're doing and return to the ER for new or worsening symptoms.

## 2023-01-28 NOTE — ED Provider Notes (Signed)
[place] EMERGENCY DEPARTMENT AT [facility] Provider Note   CSN: [id number] Arrival date & time: 01/28/23  1214     History  Chief Complaint  Patient presents with   Hyperglycemia    [name] is a 57 y.o. female with history of asthma, GERD, and sleep apnea who presents to the ER for abnormal labs.  Patient had reported 2 weeks of polyuria and polydipsia.  She went to urgent care today because she reported that the co-pay at her PCP was too high.  At urgent care she was noted to have an elevated A1c of 14.2, and a blood glucose greater than 500.  No history of diabetes.  She reports unintentional weight loss of about 20 pounds.    Hyperglycemia Associated symptoms: increased thirst and polyuria        Home Medications Prior to Admission medications   Medication Sig Start Date End Date Taking? Authorizing Provider  metFORMIN (GLUCOPHAGE) 500 MG tablet Take 1 tablet (500 mg total) by mouth daily with breakfast. 01/28/23 02/27/23 Yes Daemon Dowty T, PA-C  albuterol (PROVENTIL) (2.5 MG/3ML) 0.083% nebulizer solution Take 3 mLs (2.5 mg total) by nebulization every 6 (six) hours as needed for wheezing. 06/06/20 12/13/24  Oretha Milch, MD  albuterol (VENTOLIN HFA) 108 (90 Base) MCG/ACT inhaler INHALE 2 PUFFS BY MOUTH EVERY 4 HOURS AS NEEDED FOR WHEEZE OR FOR SHORTNESS OF BREATH 08/07/21   Oretha Milch, MD  atenolol (TENORMIN) 100 MG tablet Take 100 mg by mouth daily.    [provider]  cyclobenzaprine (FLEXERIL) 10 MG tablet Take 1 tablet (10 mg total) by mouth 2 (two) times daily as needed for muscle spasms. 07/08/18   Wieters, Hallie C, PA-C  furosemide (LASIX) 20 MG tablet Take 1 tablet (20 mg total) by mouth 2 (two) times daily as needed for up to 3 days. 01/17/20 01/20/20  Hall-Potvin, [place], PA-C  gabapentin (NEURONTIN) 600 MG tablet Take 600 mg by mouth at bedtime.    [provider]  hydrochlorothiazide (HYDRODIURIL) 12.5 MG tablet TAKE  1 TABLET (12.5 MG TOTAL) DAILY BY MOUTH. 12/22/17   Sheliah Hatch, MD  HYDROcodone-acetaminophen (NORCO/VICODIN) 5-325 MG tablet Take 1 tablet by mouth 2 (two) times daily as needed for moderate pain.    [provider]  ibuprofen (ADVIL,MOTRIN) 800 MG tablet Take 1 tablet (800 mg total) by mouth every 8 (eight) hours as needed. Patient taking differently: Take 600 mg by mouth every 8 (eight) hours as needed. 01/13/18   Tarry Kos, MD  meloxicam (MOBIC) 7.5 MG tablet meloxicam    [provider]  metroNIDAZOLE (FLAGYL) 500 MG tablet Take 1 tablet (500 mg total) by mouth 2 (two) times daily. 08/19/22   Romualdo Bolk, MD  montelukast (SINGULAIR) 10 MG tablet TAKE 1 TABLET BY MOUTH EVERYDAY AT BEDTIME 09/19/17   Oretha Milch, MD  Nebulizers (COMPRESSOR/NEBULIZER) MISC As directed 09/20/11   Zola Button, Grayling Congress, DO  oxybutynin (DITROPAN-XL) 5 MG 24 hr tablet Take 1 tablet (5 mg total) by mouth at bedtime. 08/19/22   Romualdo Bolk, MD  SYMBICORT 160-4.5 MCG/ACT inhaler INHALE 2 PUFFS BY MOUTH INTO THE LUNGS TWICE A DAY 03/12/22   Oretha Milch, MD      Allergies    Patient has no known allergies.    Review of Systems   Review of Systems  Endocrine: Positive for polydipsia and polyuria.  All other systems reviewed and are negative.  Physical Exam Updated Vital Signs BP (!) 156/77   Pulse 72   Temp 98.1 F (36.7 C)   Resp 19   LMP  (LMP Unknown)   SpO2 97%  Physical Exam Vitals and nursing note reviewed.  Constitutional:      Appearance: Normal appearance.  HENT:     Head: Normocephalic and atraumatic.  Eyes:     Conjunctiva/sclera: Conjunctivae normal.  Cardiovascular:     Rate and Rhythm: Normal rate and regular rhythm.  Pulmonary:     Effort: Pulmonary effort is normal. No respiratory distress.     Breath sounds: Normal breath sounds.  Abdominal:     General: There is no distension.     Palpations: Abdomen is soft.     Tenderness: There  is no abdominal tenderness.  Skin:    General: Skin is warm and dry.  Neurological:     General: No focal deficit present.     Mental Status: She is alert.     ED Results / Procedures / Treatments   Labs (all labs ordered are listed, but only abnormal results are displayed) Labs Reviewed  BASIC METABOLIC PANEL - Abnormal; Notable for the following components:      Result Value   Sodium 133 (*)    Chloride 92 (*)    Glucose, Bld 397 (*)    All other components within normal limits  URINALYSIS, ROUTINE W REFLEX MICROSCOPIC - Abnormal; Notable for the following components:   Glucose, UA >=500 (*)    Ketones, ur 20 (*)    All other components within normal limits  BETA-HYDROXYBUTYRIC ACID - Abnormal; Notable for the following components:   Beta-Hydroxybutyric Acid 0.75 (*)    All other components within normal limits  CBG MONITORING, ED - Abnormal; Notable for the following components:   Glucose-Capillary 386 (*)    All other components within normal limits  CBG MONITORING, ED - Abnormal; Notable for the following components:   Glucose-Capillary 293 (*)    All other components within normal limits  I-STAT VENOUS BLOOD GAS, ED - Abnormal; Notable for the following components:   pO2, Ven 27 (*)    Bicarbonate 32.5 (*)    TCO2 34 (*)    Acid-Base Excess 5.0 (*)    HCT 48.0 (*)    Hemoglobin 16.3 (*)    All other components within normal limits  CBC  CBG MONITORING, ED    EKG None  Radiology DG Chest 2 View  Result Date: 01/28/2023 CLINICAL DATA:  Palpitations. EXAM: CHEST - 2 VIEW COMPARISON:  X-ray 12/07/2020 FINDINGS: Eventration of the right hemidiaphragm. Underinflation. No pneumothorax, effusion or edema. There is some patchy left lung base opacity. Atelectasis versus infiltrate. Recommend follow-up. Normal cardiopericardial silhouette. Degenerative changes of the spine on lateral view IMPRESSION: Underinflation. Subtle opacity left lung base. Atelectasis versus  infiltrate. Recommend follow-up Electronically Signed   By: Karen Kays M.D.   On: 01/28/2023 13:18    Procedures Procedures    Medications Ordered in ED Medications  sodium chloride 0.9 % bolus 1,000 mL (1,000 mLs Intravenous New Bag/Given 01/28/23 1701)    ED Course/ Medical Decision Making/ A&P                             Medical Decision Making Amount and/or Complexity of Data Reviewed Labs: ordered.   This patient is a 57 y.o. female  who presents to the ED for concern of hyperglycemia.  No hx of diabetes.    Past Medical History / Co-morbidities: asthma, GERD, and sleep apnea  Additional history: Chart reviewed. Pertinent results include: Cannot view lab work from urgent care in Care Everywhere  Physical Exam: Physical exam performed. The pertinent findings include: Hypertensive, but otherwise normal vital signs, in no distress. Abdomen soft, non-tender.Marland Kitchen Heart RRR, lung sounds clear.   Lab Tests/Imaging studies: I personally interpreted labs/imaging and the pertinent results include: CBC unremarkable.  BMP with glucose 397, sodium 133 (corrected to 138), chloride 92, normal potassium, normal kidney function.  Beta hydroxybutyrate 0.75.  Urinalysis with over 500 glucose.  Venous blood gas with bicarb of 32.5, normal pH. No evidence of DKA  CXR with subtle opacity in left lung base, atelectasis versus infiltrate  Cardiac monitoring: EKG obtained and interpreted by my attending physician which shows: Sinus rhythm with borderline T wave abnormalities   Medications: I ordered medication including IVF.  I have reviewed the patients home medicines and have made adjustments as needed.   Disposition: After consideration of the diagnostic results and the patients response to treatment, I feel that emergency department workup does not suggest an emergent condition requiring admission or immediate intervention beyond what has been performed at this time. The plan is: Discharged  to home with new diagnosis of diabetes, and started on metformin once daily..  No evidence of DKA.  No other emergent etiology found today. The patient is safe for discharge and has been instructed to return immediately for worsening symptoms, change in symptoms or any other concerns.  Final Clinical Impression(s) / ED Diagnoses Final diagnoses:  Hyperglycemia  Diabetes mellitus, new onset    Rx / DC Orders ED Discharge Orders          Ordered    metFORMIN (GLUCOPHAGE) 500 MG tablet  Daily with breakfast        01/28/23 1814           Portions of this report may have been transcribed using voice recognition software. Every effort was made to ensure accuracy; however, inadvertent computerized transcription errors may be present.    Jeanella Flattery 01/28/23 1815    Derwood Kaplan, MD 01/28/23 2358

## 2023-01-28 NOTE — ED Provider Triage Note (Signed)
Emergency Medicine Provider Triage Evaluation Note  [name] , a 57 y.o. female  was evaluated in triage.  Pt complains of generalized weakness, palpitations, polydipsia, and polyuria for the last 2 weeks.  Attempted to be seen by her PCP this morning but cannot afford co-pay so went to urgent care instead where she had an elevated A1c at 14.2 and blood glucose greater than 500.  No history of diabetes/gestational diabetes/prediabetes.  Patient also reports a recent weight loss of about 20 pounds.  She denies chest pain, shortness of breath, cough, congestion, fever, chills, nausea, vomiting, diarrhea, or abdominal pain.  She was not prescribed any diabetic medications at urgent care but was sent to the ED for further evaluation due to how high her A1c was.  Review of Systems  Positive: See HPI Negative: See HPI  Physical Exam  BP (!) 147/79 (BP Location: Right Arm)   Pulse 77   Temp 98.7 F (37.1 C) (Oral)   Resp 18   LMP  (LMP Unknown)   SpO2 94%  Gen:   Awake, no distress   Resp:  Normal effort lungs clear to auscultation MSK:   Moves extremities without difficulty  Other:  Regular rate and rhythm, abdomen soft and nontender, mildly dry mucous membranes  Medical Decision Making  Medically screening exam initiated at 12:40 PM.  Appropriate orders placed.  [name] was informed that the remainder of the evaluation will be completed by another provider, this initial triage assessment does not replace that evaluation, and the importance of remaining in the ED until their evaluation is complete.     Tonette Lederer, PA-C 01/28/23 1243

## 2023-01-30 DIAGNOSIS — G4733 Obstructive sleep apnea (adult) (pediatric): Secondary | ICD-10-CM | POA: Diagnosis not present

## 2023-01-30 DIAGNOSIS — Z8639 Personal history of other endocrine, nutritional and metabolic disease: Secondary | ICD-10-CM | POA: Diagnosis not present

## 2023-01-30 DIAGNOSIS — J4540 Moderate persistent asthma, uncomplicated: Secondary | ICD-10-CM | POA: Diagnosis not present

## 2023-01-30 DIAGNOSIS — E1165 Type 2 diabetes mellitus with hyperglycemia: Secondary | ICD-10-CM | POA: Diagnosis not present

## 2023-01-30 DIAGNOSIS — M545 Low back pain, unspecified: Secondary | ICD-10-CM | POA: Diagnosis not present

## 2023-01-31 ENCOUNTER — Encounter: Payer: Self-pay | Admitting: Obstetrics and Gynecology

## 2023-01-31 ENCOUNTER — Ambulatory Visit (INDEPENDENT_AMBULATORY_CARE_PROVIDER_SITE_OTHER): Payer: Medicare HMO | Admitting: Obstetrics and Gynecology

## 2023-01-31 VITALS — BP 122/82 | HR 84 | Wt 308.0 lb

## 2023-01-31 DIAGNOSIS — N760 Acute vaginitis: Secondary | ICD-10-CM

## 2023-01-31 DIAGNOSIS — R300 Dysuria: Secondary | ICD-10-CM | POA: Diagnosis not present

## 2023-01-31 DIAGNOSIS — R350 Frequency of micturition: Secondary | ICD-10-CM

## 2023-01-31 LAB — CBC AND DIFFERENTIAL
HCT: 45 (ref 36–46)
Hemoglobin: 14.9 (ref 12.0–16.0)
Platelets: 254 10*3/uL (ref 150–400)
WBC: 5.9

## 2023-01-31 LAB — URINALYSIS, COMPLETE
Ketones, ur: NEGATIVE
Nitrite: NEGATIVE
Specific Gravity, Urine: 1.015 (ref 1.001–1.035)
pH: 5.5 (ref 5.0–8.0)

## 2023-01-31 LAB — IRON,TIBC AND FERRITIN PANEL
%SAT: 29
Ferritin: 257

## 2023-01-31 LAB — BASIC METABOLIC PANEL
Chloride: 93 — AB (ref 99–108)
Creatinine: 0.7 (ref 0.5–1.1)
Glucose: 383
Sodium: 134 — AB (ref 137–147)

## 2023-01-31 LAB — HEPATIC FUNCTION PANEL
ALT: 31 U/L (ref 7–35)
AST: 38 — AB (ref 13–35)

## 2023-01-31 LAB — VITAMIN B12: Vitamin B-12: 1077

## 2023-01-31 LAB — COMPREHENSIVE METABOLIC PANEL
Albumin: 4.5 (ref 3.5–5.0)
Globulin: 3
eGFR: 98

## 2023-01-31 LAB — WET PREP FOR TRICH, YEAST, CLUE

## 2023-01-31 LAB — VITAMIN D 25 HYDROXY (VIT D DEFICIENCY, FRACTURES): Vit D, 25-Hydroxy: 60

## 2023-01-31 LAB — LAB REPORT - SCANNED
A1c: 14.2
EGFR: 98

## 2023-01-31 LAB — CBC: RBC: 4.81 (ref 3.87–5.11)

## 2023-01-31 LAB — TSH: TSH: 1.42 (ref 0.41–5.90)

## 2023-01-31 LAB — HEMOGLOBIN A1C: Hemoglobin A1C: 14.2

## 2023-01-31 MED ORDER — FLUCONAZOLE 150 MG PO TABS
150.0000 mg | ORAL_TABLET | Freq: Once | ORAL | 0 refills | Status: AC
Start: 2023-01-31 — End: 2023-01-31

## 2023-01-31 MED ORDER — CLOBETASOL PROPIONATE 0.05 % EX OINT
1.0000 | TOPICAL_OINTMENT | Freq: Two times a day (BID) | CUTANEOUS | 0 refills | Status: AC
Start: 2023-01-31 — End: ?

## 2023-01-31 NOTE — Progress Notes (Signed)
GYNECOLOGY  VISIT   HPI: 57 y.o.   Married  [place]  female   [phone] with No LMP recorded (lmp unknown). Patient is postmenopausal.   here for   urinary frequency. Pt has noticed irritation and small bumps externally, as well as urinary frequency and burning thinks the burning is external). She c/o severe vaginal/vulvar itching. She was given diflucan by her primary last month, after taking antibiotics.   UA  on 01/28/23 with >=500, ketones  Pt was diagnosed on Monday with Type 2 diabetes (hgbA1C 14.2, glucose was 500). She was started on metformin. She presented with polydipsia and polyuria.    GYNECOLOGIC HISTORY: No LMP recorded (lmp unknown). Patient is postmenopausal. Contraception:  PMP Menopausal hormone therapy:  n/a Last mammogram:  06/07/22 Breast Density Cat B, BI-RADS CAT 1 neg Last pap smear:   08/19/22 neg        OB History     Gravida  3   Para  2   Term  2   Preterm      AB  1   Living         SAB      IAB      Ectopic  1   Multiple      Live Births                 Patient Active Problem List   Diagnosis Date Noted   HTN (hypertension) 08/29/2017   Vitamin D deficiency 06/30/2017   Lumbar degenerative disc disease 06/04/2017   Chronic foot pain, left 06/04/2017   Chronic pain of left ankle 06/04/2017   Plantar fasciitis 06/04/2017   Carpal tunnel syndrome on both sides 11/25/2016   Allergic rhinitis 11/13/2015   Thyromegaly 12/23/2014   Porokeratosis 12/16/2014   Pain of left heel 12/05/2014   Severe obesity (BMI >= 40) 12/15/2013   Other dysphagia 12/15/2013   Edema 07/22/2012   Visual changes 07/22/2012   Seasonal allergic rhinitis 12/16/2011   Asthma 11/13/2011   General medical examination 07/09/2011   Exposure to hepatitis B 07/09/2011   OSA (obstructive sleep apnea) 07/09/2011   Back pain 07/09/2011    Past Medical History:  Diagnosis Date   Allergy    Asthma    Bronchitis    GERD (gastroesophageal reflux  disease)    Sleep apnea    uses c pap     Past Surgical History:  Procedure Laterality Date   DENTAL SURGERY     ECTOPIC PREGNANCY SURGERY  2006    Current Outpatient Medications  Medication Sig Dispense Refill   albuterol (PROVENTIL) (2.5 MG/3ML) 0.083% nebulizer solution Take 3 mLs (2.5 mg total) by nebulization every 6 (six) hours as needed for wheezing. 75 mL 12   albuterol (VENTOLIN HFA) 108 (90 Base) MCG/ACT inhaler INHALE 2 PUFFS BY MOUTH EVERY 4 HOURS AS NEEDED FOR WHEEZE OR FOR SHORTNESS OF BREATH 36 each 3   atenolol (TENORMIN) 100 MG tablet Take 100 mg by mouth daily.     cyclobenzaprine (FLEXERIL) 10 MG tablet Take 1 tablet (10 mg total) by mouth 2 (two) times daily as needed for muscle spasms. 20 tablet 0   gabapentin (NEURONTIN) 600 MG tablet Take 600 mg by mouth at bedtime.     hydrochlorothiazide (HYDRODIURIL) 12.5 MG tablet TAKE 1 TABLET (12.5 MG TOTAL) DAILY BY MOUTH. 30 tablet 3   ibuprofen (ADVIL,MOTRIN) 800 MG tablet Take 1 tablet (800 mg total) by mouth every 8 (eight) hours as needed. (  Patient taking differently: Take 600 mg by mouth every 8 (eight) hours as needed.) 30 tablet 2   meloxicam (MOBIC) 7.5 MG tablet meloxicam     metFORMIN (GLUCOPHAGE) 500 MG tablet Take 1 tablet (500 mg total) by mouth daily with breakfast. 30 tablet 0   montelukast (SINGULAIR) 10 MG tablet TAKE 1 TABLET BY MOUTH EVERYDAY AT BEDTIME 30 tablet 3   Nebulizers (COMPRESSOR/NEBULIZER) MISC As directed 1 each 0   SYMBICORT 160-4.5 MCG/ACT inhaler INHALE 2 PUFFS BY MOUTH INTO THE LUNGS TWICE A DAY 10.2 each 6   furosemide (LASIX) 20 MG tablet Take 1 tablet (20 mg total) by mouth 2 (two) times daily as needed for up to 3 days. 6 tablet 0   HYDROcodone-acetaminophen (NORCO/VICODIN) 5-325 MG tablet Take 1 tablet by mouth 2 (two) times daily as needed for moderate pain. (Patient not taking: Reported on 01/31/2023)     No current facility-administered medications for this visit.     ALLERGIES:  Patient has no known allergies.  Family History  Problem Relation Age of Onset   Hypertension Father    Hypertension Sister    Stroke Paternal Grandfather    Hypertension Paternal Grandfather    Hypertension Sister    Colon cancer Neg Hx    Colon polyps Neg Hx    Esophageal cancer Neg Hx    Rectal cancer Neg Hx    Stomach cancer Neg Hx    Breast cancer Neg Hx     Social History   Socioeconomic History   Marital status: Married    Spouse name: Not on file   Number of children: Not on file   Years of education: Not on file   Highest education level: Not on file  Occupational History   Not on file  Tobacco Use   Smoking status: Never   Smokeless tobacco: Never  Vaping Use   Vaping Use: Never used  Substance and Sexual Activity   Alcohol use: Yes    Alcohol/week: 0.0 standard drinks of alcohol    Comment: rare   Drug use: No   Sexual activity: Yes    Birth control/protection: Post-menopausal    Comment: >5 sexual partners  Other Topics Concern   Not on file  Social History Narrative   Not on file   Social Determinants of Health   Financial Resource Strain: Not on file  Food Insecurity: Not on file  Transportation Needs: Not on file  Physical Activity: Not on file  Stress: Not on file  Social Connections: Not on file  Intimate Partner Violence: Not on file    Review of Systems  Genitourinary:  Positive for dysuria and frequency.  All other systems reviewed and are negative.   PHYSICAL EXAMINATION:    BP 122/82 (BP Location: Left Arm, Patient Position: Sitting, Cuff Size: Large)   Pulse 84   Wt (!) 308 lb (139.7 kg)   LMP  (LMP Unknown)   SpO2 94%   BMI 51.94 kg/m     General appearance: alert, cooperative and appears stated age Head: Normocephalic, without obvious abnormality, atraumatic Neck: no adenopathy, supple, symmetrical, trachea midline and thyroid normal to inspection and palpation Lungs: clear to auscultation bilaterally Breasts: normal  appearance, no masses or tenderness, No nipple retraction or dimpling, No nipple discharge or bleeding, No axillary or supraclavicular adenopathy Heart: regular rate and rhythm Abdomen: soft, non-tender, no masses,  no organomegaly Extremities: extremities normal, atraumatic, no cyanosis or edema Skin: Skin color, texture, turgor normal. No rashes  or lesions Lymph nodes: Cervical, supraclavicular, and axillary nodes normal. No abnormal inguinal nodes palpated Neurologic: Grossly normal  Pelvic: External genitalia:  erythematous, irritated, small vulvar cysts noted              Urethra:  normal appearing urethra with no masses, tenderness or lesions              Bartholins and Skenes: normal                 Vagina: erythematous appearing vagina with an increase in creamy, white vaginal d/c              Cervix: no lesions                 Chaperone was present for exam:  Kristin Bruins, CMA  1. Urinary frequency Suspect from the DM - Urinalysis, Complete -Urine culture  2. Dysuria - Urinalysis, Complete -Urine culture  3. Acute vaginitis - WET PREP FOR TRICH, YEAST, CLUE: negative - clobetasol ointment (TEMOVATE) 0.05 %; Apply 1 Application topically 2 (two) times daily. Apply a small amount topically twice daily for up to 2 weeks  Dispense: 60 g; Refill: 0 - SureSwab Advanced Vaginitis, TMA - fluconazole (DIFLUCAN) 150 MG tablet; Take 1 tablet (150 mg total) by mouth once for 1 dose.  Dispense: 1 tablet; Refill: 0. Clinically appears like yeast, will treat will awaiting culture. If + for yeast will send in more diflucan

## 2023-02-01 LAB — SURESWAB® ADVANCED VAGINITIS,TMA
CANDIDA SPECIES: DETECTED — AB
Candida glabrata: NOT DETECTED
SURESWAB(R) ADV BACTERIAL VAGINOSIS(BV),TMA: POSITIVE — AB
TRICHOMONAS VAGINALIS (TV),TMA: NOT DETECTED

## 2023-02-01 LAB — URINE CULTURE
MICRO NUMBER:: 14849126
SPECIMEN QUALITY:: ADEQUATE

## 2023-02-05 ENCOUNTER — Other Ambulatory Visit: Payer: Self-pay | Admitting: *Deleted

## 2023-02-05 MED ORDER — FLUCONAZOLE 150 MG PO TABS: 150.0000 mg | ORAL_TABLET | Freq: Once | ORAL | 0 refills | Status: AC

## 2023-02-05 MED ORDER — METRONIDAZOLE 500 MG PO TABS: 500.0000 mg | ORAL_TABLET | Freq: Two times a day (BID) | ORAL | 0 refills | Status: DC

## 2023-02-13 ENCOUNTER — Ambulatory Visit: Payer: Medicare HMO | Admitting: Pharmacist

## 2023-02-17 DIAGNOSIS — E119 Type 2 diabetes mellitus without complications: Secondary | ICD-10-CM | POA: Diagnosis not present

## 2023-02-17 DIAGNOSIS — M79604 Pain in right leg: Secondary | ICD-10-CM | POA: Diagnosis not present

## 2023-02-17 DIAGNOSIS — M545 Low back pain, unspecified: Secondary | ICD-10-CM | POA: Diagnosis not present

## 2023-02-17 DIAGNOSIS — J4540 Moderate persistent asthma, uncomplicated: Secondary | ICD-10-CM | POA: Diagnosis not present

## 2023-02-17 DIAGNOSIS — M1990 Unspecified osteoarthritis, unspecified site: Secondary | ICD-10-CM | POA: Diagnosis not present

## 2023-02-18 ENCOUNTER — Encounter (HOSPITAL_BASED_OUTPATIENT_CLINIC_OR_DEPARTMENT_OTHER): Payer: Self-pay | Admitting: Pulmonary Disease

## 2023-02-18 ENCOUNTER — Ambulatory Visit (HOSPITAL_BASED_OUTPATIENT_CLINIC_OR_DEPARTMENT_OTHER): Payer: Medicare HMO | Admitting: Pulmonary Disease

## 2023-02-18 VITALS — BP 142/86 | HR 91 | Temp 98.0°F | Ht 65.0 in | Wt 276.4 lb

## 2023-02-18 DIAGNOSIS — J4530 Mild persistent asthma, uncomplicated: Secondary | ICD-10-CM

## 2023-02-18 DIAGNOSIS — G4733 Obstructive sleep apnea (adult) (pediatric): Secondary | ICD-10-CM | POA: Diagnosis not present

## 2023-02-18 NOTE — Patient Instructions (Signed)
CPAP is working well. Okay to trial dental appliance

## 2023-02-18 NOTE — Assessment & Plan Note (Signed)
Mild persistent symptoms. Appears well-controlled on Symbicort.  Albuterol usage 2-3 times a week

## 2023-02-18 NOTE — Progress Notes (Signed)
   Subjective:    Patient ID: [name], female    DOB: 02-Jan-1966, 57 y.o.   MRN: [id number]  HPI  57 yo never smoker for FU of asthma and mod obstructive sleep apnea.  -On auto CPAP    Asthma was diagnosed in 2006. Her triggers include uri and weather changes. h/o seasonal allergies >>  No formal allergy testing  PMH - HFpEF  43-month follow-up visit. Accompanied by her son.  She has just been diagnosed with diabetes and started on metformin. She takes Lasix every day and complains of leg cramps, restarting on magnesium. Asthma is well-controlled on Symbicort.  She uses albuterol MDI 2-3 times a week.  She has been taking Zyrtec during pollen season She reports good compliance with CPAP but still finds this to be a nuisance.  No problems with nasal mask or pressure    Significant tests/ events reviewed  01/2022 spirometry shows no obstruction with ratio 84, FEV1 73%, FVC 69%   01/29/2012 Spirometry showed no airway obstruction, FEV1 was 82% FVC was 79% ratio 79 PSG 2013 moderate obstructive sleep apnea - AHI 18/h   Review of Systems neg for any significant sore throat, dysphagia, itching, sneezing, nasal congestion or excess/ purulent secretions, fever, chills, sweats, unintended wt loss, pleuritic or exertional cp, hempoptysis, orthopnea pnd or change in chronic leg swelling. Also denies presyncope, palpitations, heartburn, abdominal pain, nausea, vomiting, diarrhea or change in bowel or urinary habits, dysuria,hematuria, rash, arthralgias, visual complaints, headache, numbness weakness or ataxia.     Objective:   Physical Exam  Gen. Pleasant, obese, in no distress ENT - no lesions, no post nasal drip Neck: No JVD, no thyromegaly, no carotid bruits Lungs: no use of accessory muscles, no dullness to percussion, decreased without rales or rhonchi  Cardiovascular: Rhythm regular, heart sounds  normal, no murmurs or gallops, no peripheral edema Musculoskeletal: No  deformities, no cyanosis or clubbing , no tremors       Assessment & Plan:

## 2023-02-18 NOTE — Assessment & Plan Note (Signed)
CPAP download was reviewed which shows excellent control of events on CPAP of 9 cm.  She has very good compliance and mild leak.  CPAP is only helped improve her daytime somnolence and fatigue. She would like to explore dental appliance especially for travel and she will discuss with her dentist.  Weight loss encouraged, compliance with goal of at least 4-6 hrs every night is the expectation. Advised against medications with sedative side effects Cautioned against driving when sleepy - understanding that sleepiness will vary on a day to day basis

## 2023-02-19 DIAGNOSIS — M79662 Pain in left lower leg: Secondary | ICD-10-CM | POA: Diagnosis not present

## 2023-02-19 DIAGNOSIS — M79661 Pain in right lower leg: Secondary | ICD-10-CM | POA: Diagnosis not present

## 2023-02-19 DIAGNOSIS — R600 Localized edema: Secondary | ICD-10-CM | POA: Diagnosis not present

## 2023-02-19 DIAGNOSIS — R252 Cramp and spasm: Secondary | ICD-10-CM | POA: Diagnosis not present

## 2023-02-19 DIAGNOSIS — I87393 Chronic venous hypertension (idiopathic) with other complications of bilateral lower extremity: Secondary | ICD-10-CM | POA: Diagnosis not present

## 2023-02-22 DIAGNOSIS — E119 Type 2 diabetes mellitus without complications: Secondary | ICD-10-CM | POA: Diagnosis not present

## 2023-02-22 DIAGNOSIS — H524 Presbyopia: Secondary | ICD-10-CM | POA: Diagnosis not present

## 2023-02-22 LAB — HM DIABETES EYE EXAM

## 2023-02-27 DIAGNOSIS — E1121 Type 2 diabetes mellitus with diabetic nephropathy: Secondary | ICD-10-CM | POA: Diagnosis not present

## 2023-02-27 DIAGNOSIS — B3731 Acute candidiasis of vulva and vagina: Secondary | ICD-10-CM | POA: Diagnosis not present

## 2023-02-27 DIAGNOSIS — R609 Edema, unspecified: Secondary | ICD-10-CM | POA: Diagnosis not present

## 2023-02-27 DIAGNOSIS — Z1231 Encounter for screening mammogram for malignant neoplasm of breast: Secondary | ICD-10-CM | POA: Diagnosis not present

## 2023-02-27 DIAGNOSIS — Z1211 Encounter for screening for malignant neoplasm of colon: Secondary | ICD-10-CM | POA: Diagnosis not present

## 2023-02-27 DIAGNOSIS — J4540 Moderate persistent asthma, uncomplicated: Secondary | ICD-10-CM | POA: Diagnosis not present

## 2023-02-27 DIAGNOSIS — I10 Essential (primary) hypertension: Secondary | ICD-10-CM | POA: Diagnosis not present

## 2023-02-27 LAB — LIPID PANEL
Cholesterol: 209 — AB (ref 0–200)
LDL Cholesterol: 126
Triglycerides: 120 (ref 40–160)

## 2023-02-28 ENCOUNTER — Other Ambulatory Visit: Payer: Self-pay | Admitting: Physician Assistant

## 2023-02-28 DIAGNOSIS — Z1231 Encounter for screening mammogram for malignant neoplasm of breast: Secondary | ICD-10-CM

## 2023-03-06 DIAGNOSIS — J4489 Other specified chronic obstructive pulmonary disease: Secondary | ICD-10-CM | POA: Diagnosis not present

## 2023-03-06 DIAGNOSIS — J302 Other seasonal allergic rhinitis: Secondary | ICD-10-CM | POA: Diagnosis not present

## 2023-03-06 DIAGNOSIS — E1142 Type 2 diabetes mellitus with diabetic polyneuropathy: Secondary | ICD-10-CM | POA: Diagnosis not present

## 2023-03-06 DIAGNOSIS — M545 Low back pain, unspecified: Secondary | ICD-10-CM | POA: Diagnosis not present

## 2023-03-06 DIAGNOSIS — R32 Unspecified urinary incontinence: Secondary | ICD-10-CM | POA: Diagnosis not present

## 2023-03-06 DIAGNOSIS — I129 Hypertensive chronic kidney disease with stage 1 through stage 4 chronic kidney disease, or unspecified chronic kidney disease: Secondary | ICD-10-CM | POA: Diagnosis not present

## 2023-03-06 DIAGNOSIS — E1122 Type 2 diabetes mellitus with diabetic chronic kidney disease: Secondary | ICD-10-CM | POA: Diagnosis not present

## 2023-03-06 DIAGNOSIS — M62838 Other muscle spasm: Secondary | ICD-10-CM | POA: Diagnosis not present

## 2023-03-06 DIAGNOSIS — I872 Venous insufficiency (chronic) (peripheral): Secondary | ICD-10-CM | POA: Diagnosis not present

## 2023-03-06 DIAGNOSIS — E1151 Type 2 diabetes mellitus with diabetic peripheral angiopathy without gangrene: Secondary | ICD-10-CM | POA: Diagnosis not present

## 2023-03-06 DIAGNOSIS — Z008 Encounter for other general examination: Secondary | ICD-10-CM | POA: Diagnosis not present

## 2023-03-06 DIAGNOSIS — M1990 Unspecified osteoarthritis, unspecified site: Secondary | ICD-10-CM | POA: Diagnosis not present

## 2023-03-17 DIAGNOSIS — Z1211 Encounter for screening for malignant neoplasm of colon: Secondary | ICD-10-CM | POA: Diagnosis not present

## 2023-03-17 LAB — COLOGUARD: Cologuard: NEGATIVE

## 2023-03-18 ENCOUNTER — Ambulatory Visit (INDEPENDENT_AMBULATORY_CARE_PROVIDER_SITE_OTHER): Payer: Medicare HMO | Admitting: Nurse Practitioner

## 2023-03-18 ENCOUNTER — Encounter: Payer: Self-pay | Admitting: Nurse Practitioner

## 2023-03-18 VITALS — BP 130/80 | HR 69 | Temp 97.7°F | Ht 65.0 in | Wt 275.4 lb

## 2023-03-18 DIAGNOSIS — Z6841 Body Mass Index (BMI) 40.0 and over, adult: Secondary | ICD-10-CM

## 2023-03-18 DIAGNOSIS — I10 Essential (primary) hypertension: Secondary | ICD-10-CM | POA: Diagnosis not present

## 2023-03-18 DIAGNOSIS — J4540 Moderate persistent asthma, uncomplicated: Secondary | ICD-10-CM

## 2023-03-18 DIAGNOSIS — G4733 Obstructive sleep apnea (adult) (pediatric): Secondary | ICD-10-CM | POA: Diagnosis not present

## 2023-03-18 DIAGNOSIS — E1165 Type 2 diabetes mellitus with hyperglycemia: Secondary | ICD-10-CM

## 2023-03-18 DIAGNOSIS — M5136 Other intervertebral disc degeneration, lumbar region: Secondary | ICD-10-CM | POA: Diagnosis not present

## 2023-03-18 NOTE — Assessment & Plan Note (Signed)
Controlled on Symbicort, albuterol inhaler Continue current medications and maintain close follow-up with pulmonology

## 2023-03-18 NOTE — Assessment & Plan Note (Signed)
Currently established with pulmonology uses a CPAP at home

## 2023-03-18 NOTE — Patient Instructions (Addendum)
Type 2 diabetes mellitus with hyperglycemia, without long-term current use of insulin (HCC)  - Amb Referral to Nutrition and Diabetic Education   Moderate persistent asthma without complication   Class 3 severe obesity due to excess calories with serious comorbidity and body mass index (BMI) of 40.0 to 44.9 in adult (HCC)  Goal for fasting blood sugar ranges from 80 to 120 and 2 hours after any meal or at bedtime should be between 130 to 170.      It is important that you exercise regularly at least 30 minutes 5 times a week as tolerated  Think about what you will eat, plan ahead. Choose " clean, green, fresh or frozen" over canned, processed or packaged foods which are more sugary, salty and fatty. 70 to 75% of food eaten should be vegetables and fruit. Three meals at set times with snacks allowed between meals, but they must be fruit or vegetables. Aim to eat over a 12 hour period , example 7 am to 7 pm, and STOP after  your last meal of the day. Drink water,generally about 64 ounces per day, no other drink is as healthy. Fruit juice is best enjoyed in a healthy way, by EATING the fruit.  Thanks for choosing [facility] we consider it a privelige to serve you.

## 2023-03-18 NOTE — Assessment & Plan Note (Addendum)
Chronic condition uncontrolled Currently on metformin 1000 mg daily Continue current medication Patient counseled on low-carb modified diet Referred to diabetic education CBG goals discussed with the patient She denies history of pancreatitis, personal or family history of medullary thyroid cancer plans to start patient on a GLP-1 at next visit if her A1c remains uncontrolled She is not on an ACE or ARB but her blood pressure is well-controlled, not on a statin.  Stated that she had labs done recently.  Records requested from her previous PCP

## 2023-03-18 NOTE — Progress Notes (Signed)
New Patient Office Visit  Subjective:  Patient ID: [name], female    DOB: Oct 08, 1966  Age: 57 y.o. MRN: [id number]  CC:  Chief Complaint  Patient presents with   Follow-up    Hospital follow up.    HPI [name] is a 57 y.o. female  has a past medical history of Allergy, Asthma, Bronchitis, Diabetes (HCC), Diabetes mellitus without complication (HCC), GERD (gastroesophageal reflux disease), Hypertension, and Sleep apnea.   Patient presents to establish care for her chronic medical conditions.  [facility], her last visit today was in April 2024 and next appointment is in July.  She is switching her primary care needs to [place] due to insurance reasons.   Uncontrolled type 2 diabetes.  Most recent A1c was 4.2.  She is currently on metformin 500 mg twice daily, states that she is on the extended release form of metformin.  Had problems with diarrhea with the immediate release form, diarrhea has improved since starting the extended release form.  She has also been checking her blood sugars at home blood sugar readings has improved.  She tries to do some walking exercises, working on her diet.   Due for shingles vaccine and Tdap vaccine patient encouraged to get the vaccine at the pharmacy    Past Medical History:  Diagnosis Date   Allergy    Asthma    Bronchitis    Diabetes (HCC)    Diabetes mellitus without complication (HCC)    GERD (gastroesophageal reflux disease)    Hypertension    Sleep apnea    uses c pap     Past Surgical History:  Procedure Laterality Date   DENTAL SURGERY     ECTOPIC PREGNANCY SURGERY  2006    Family History  Problem Relation Age of Onset   Hypertension Father    Asthma Father    Hypertension Sister    Hypertension Sister    Stroke Paternal Grandfather    Hypertension Paternal Grandfather    Colon cancer Neg Hx    Colon polyps Neg Hx    Esophageal cancer Neg Hx    Rectal cancer Neg Hx    Stomach  cancer Neg Hx    Breast cancer Neg Hx     Social History   Socioeconomic History   Marital status: Married    Spouse name: Not on file   Number of children: 2   Years of education: Not on file   Highest education level: Not on file  Occupational History   Not on file  Tobacco Use   Smoking status: Never   Smokeless tobacco: Never  Vaping Use   Vaping Use: Never used  Substance and Sexual Activity   Alcohol use: Not Currently    Comment: rare   Drug use: No   Sexual activity: Yes    Birth control/protection: Post-menopausal    Comment: >5 sexual partners  Other Topics Concern   Not on file  Social History Narrative   Lives with her husband   Social Determinants of Health   Financial Resource Strain: Not on file  Food Insecurity: Not on file  Transportation Needs: Not on file  Physical Activity: Not on file  Stress: Not on file  Social Connections: Not on file  Intimate Partner Violence: Not on file    ROS Review of Systems  Constitutional:  Negative for activity change, appetite change, chills, fatigue and fever.  HENT:  Negative for ear discharge, ear  pain, hearing loss, rhinorrhea, sinus pressure and sinus pain.   Respiratory:  Negative for cough, chest tightness, shortness of breath and wheezing.   Cardiovascular:  Negative for chest pain, palpitations and leg swelling.  Gastrointestinal:  Negative for abdominal distention, abdominal pain, anal bleeding, blood in stool and constipation.  Endocrine: Negative for polydipsia, polyphagia and polyuria.  Genitourinary:  Negative for difficulty urinating, dysuria, flank pain, frequency, hematuria, menstrual problem, pelvic pain and vaginal bleeding.  Musculoskeletal:  Positive for arthralgias and back pain. Negative for joint swelling and myalgias.  Skin:  Negative for color change, pallor, rash and wound.  Allergic/Immunologic: Negative for immunocompromised state.  Neurological:  Negative for dizziness, tremors,  facial asymmetry, weakness and headaches.  Hematological:  Negative for adenopathy. Does not bruise/bleed easily.  Psychiatric/Behavioral:  Negative for agitation, behavioral problems, confusion, decreased concentration, hallucinations, self-injury and suicidal ideas.     Objective:   Today's Vitals: BP 130/80   Pulse 69   Temp 97.7 F (36.5 C)   Ht 5\' 5"  (1.651 m)   Wt 275 lb 6.4 oz (124.9 kg)   LMP  (LMP Unknown)   SpO2 96%   BMI 45.83 kg/m   Physical Exam Vitals and nursing note reviewed.  Constitutional:      General: She is not in acute distress.    Appearance: Normal appearance. She is obese. She is not ill-appearing, toxic-appearing or diaphoretic.  HENT:     Mouth/Throat:     Mouth: Mucous membranes are moist.     Pharynx: Oropharynx is clear. No oropharyngeal exudate or posterior oropharyngeal erythema.  Eyes:     General: No scleral icterus.       Right eye: No discharge.        Left eye: No discharge.     Extraocular Movements: Extraocular movements intact.     Conjunctiva/sclera: Conjunctivae normal.  Cardiovascular:     Rate and Rhythm: Normal rate and regular rhythm.     Pulses: Normal pulses.     Heart sounds: Normal heart sounds. No murmur heard.    No friction rub. No gallop.  Pulmonary:     Effort: Pulmonary effort is normal. No respiratory distress.     Breath sounds: Normal breath sounds. No stridor. No wheezing, rhonchi or rales.  Chest:     Chest wall: No tenderness.  Abdominal:     General: There is no distension.     Palpations: Abdomen is soft.     Tenderness: There is no abdominal tenderness. There is no right CVA tenderness, left CVA tenderness or guarding.  Musculoskeletal:        General: No swelling, tenderness, deformity or signs of injury.     Right lower leg: No edema.     Left lower leg: No edema.     Comments: Patient sitting comfortably in a chair. Using a cane for ambulation  Skin:    General: Skin is warm and dry.      Capillary Refill: Capillary refill takes less than 2 seconds.     Coloration: Skin is not jaundiced or pale.     Findings: No bruising, erythema or lesion.  Neurological:     Mental Status: She is alert and oriented to person, place, and time.     Motor: No weakness.     Coordination: Coordination normal.     Gait: Gait normal.  Psychiatric:        Mood and Affect: Mood normal.        Behavior: Behavior  normal.        Thought Content: Thought content normal.        Judgment: Judgment normal.     Assessment & Plan:   Problem List Items Addressed This Visit       Cardiovascular and Mediastinum   HTN (hypertension)    BP Readings from Last 3 Encounters:  03/18/23 130/80  02/18/23 (!) 142/86  01/31/23 122/82   HTN Controlled on hydrochlorothiazide 12.5 mg daily, atenolol 100 mg daily, Continue current medications. No changes in management. Discussed DASH diet and dietary sodium restrictions Continue to increase dietary efforts and exercise.           Respiratory   OSA (obstructive sleep apnea)    Currently established with pulmonology uses a CPAP at home      Asthma    Controlled on Symbicort, albuterol inhaler Continue current medications and maintain close follow-up with pulmonology      Relevant Medications   albuterol (VENTOLIN HFA) 108 (90 Base) MCG/ACT inhaler     Endocrine   Type 2 diabetes mellitus with hyperglycemia, without long-term current use of insulin (HCC) - Primary    Chronic condition uncontrolled Currently on metformin 1000 mg daily Continue current medication Patient counseled on low-carb modified diet Referred to diabetic education CBG goals discussed with the patient She denies history of pancreatitis, personal or family history of medullary thyroid cancer plans to start patient on a GLP-1 at next visit if her A1c remains uncontrolled She is not on an ACE or ARB but her blood pressure is well-controlled, not on a statin.  Stated that she had  labs done recently.  Records requested from her previous PCP       Relevant Orders   Amb Referral to Nutrition and Diabetic Education     Musculoskeletal and Integument   Lumbar degenerative disc disease    Chronic condition, Worse with sitting , standing, walking. uses a cane for ambulation Continue gabapentin 600 mg at bedtime, meloxicam 7.5 mg daily as needed.  Moderate Exercise as tolerated recommended        Other   Class 3 severe obesity due to excess calories with serious comorbidity and body mass index (BMI) of 45.0 to 49.9 in adult [facility])    Wt Readings from Last 3 Encounters:  03/18/23 275 lb 6.4 oz (124.9 kg)  02/18/23 276 lb 6.4 oz (125.4 kg)  01/31/23 (!) 308 lb (139.7 kg)   Body mass index is 45.83 kg/m.  Patient counseled on low-carb modified diet Encouraged to engage in regular moderate exercise with at least 150 minutes weekly as tolerated. Plans on starting the patient on a GLP-1 at next visit if A1c is not under control       Outpatient Encounter Medications as of 03/18/2023  Medication Sig   albuterol (PROVENTIL) (2.5 MG/3ML) 0.083% nebulizer solution Take 3 mLs (2.5 mg total) by nebulization every 6 (six) hours as needed for wheezing.   albuterol (VENTOLIN HFA) 108 (90 Base) MCG/ACT inhaler INHALE 2 PUFFS BY MOUTH EVERY 4 HOURS AS NEEDED FOR WHEEZE OR FOR SHORTNESS OF BREATH   albuterol (VENTOLIN HFA) 108 (90 Base) MCG/ACT inhaler Ventolin HFA   atenolol (TENORMIN) 100 MG tablet Take 100 mg by mouth daily.   clobetasol ointment (TEMOVATE) 0.05 % Apply 1 Application topically 2 (two) times daily. Apply a small amount topically twice daily for up to 2 weeks   cyclobenzaprine (FLEXERIL) 10 MG tablet Take 1 tablet (10 mg total) by mouth 2 (two)  times daily as needed for muscle spasms.   gabapentin (NEURONTIN) 600 MG tablet Take 600 mg by mouth at bedtime.   hydrochlorothiazide (HYDRODIURIL) 12.5 MG tablet TAKE 1 TABLET (12.5 MG TOTAL) DAILY BY MOUTH.   ibuprofen  (ADVIL,MOTRIN) 800 MG tablet Take 1 tablet (800 mg total) by mouth every 8 (eight) hours as needed. (Patient taking differently: Take 600 mg by mouth every 8 (eight) hours as needed.)   meloxicam (MOBIC) 7.5 MG tablet meloxicam   metFORMIN (GLUCOPHAGE) 500 MG tablet Take 1 tablet (500 mg total) by mouth daily with breakfast.   montelukast (SINGULAIR) 10 MG tablet TAKE 1 TABLET BY MOUTH EVERYDAY AT BEDTIME   Nebulizers (COMPRESSOR/NEBULIZER) MISC As directed   SYMBICORT 160-4.5 MCG/ACT inhaler INHALE 2 PUFFS BY MOUTH INTO THE LUNGS TWICE A DAY   furosemide (LASIX) 20 MG tablet Take 1 tablet (20 mg total) by mouth 2 (two) times daily as needed for up to 3 days. (Patient not taking: Reported on 03/18/2023)   HYDROcodone-acetaminophen (NORCO/VICODIN) 5-325 MG tablet Take 1 tablet by mouth 2 (two) times daily as needed for moderate pain. (Patient not taking: Reported on 03/18/2023)   No facility-administered encounter medications on file as of 03/18/2023.    Follow-up: Return in about 6 weeks (around 04/29/2023) for DM, HTN.   Donell Beers, FNP

## 2023-03-18 NOTE — Assessment & Plan Note (Addendum)
Wt Readings from Last 3 Encounters:  03/18/23 275 lb 6.4 oz (124.9 kg)  02/18/23 276 lb 6.4 oz (125.4 kg)  01/31/23 (!) 308 lb (139.7 kg)   Body mass index is 45.83 kg/m.  Patient counseled on low-carb modified diet Encouraged to engage in regular moderate exercise with at least 150 minutes weekly as tolerated. Plans on starting the patient on a GLP-1 at next visit if A1c is not under control

## 2023-03-18 NOTE — Assessment & Plan Note (Signed)
BP Readings from Last 3 Encounters:  03/18/23 130/80  02/18/23 (!) 142/86  01/31/23 122/82   HTN Controlled on hydrochlorothiazide 12.5 mg daily, atenolol 100 mg daily, Continue current medications. No changes in management. Discussed DASH diet and dietary sodium restrictions Continue to increase dietary efforts and exercise.

## 2023-03-18 NOTE — Assessment & Plan Note (Addendum)
Chronic condition, Worse with sitting , standing, walking. uses a cane for ambulation Continue gabapentin 600 mg at bedtime, meloxicam 7.5 mg daily as needed.  Moderate Exercise as tolerated recommended

## 2023-03-22 LAB — EXTERNAL GENERIC LAB PROCEDURE: COLOGUARD: NEGATIVE

## 2023-03-22 LAB — COLOGUARD: COLOGUARD: NEGATIVE

## 2023-04-16 ENCOUNTER — Ambulatory Visit: Payer: Medicare Other | Admitting: Family Medicine

## 2023-04-28 ENCOUNTER — Other Ambulatory Visit: Payer: Self-pay | Admitting: Nurse Practitioner

## 2023-04-28 ENCOUNTER — Encounter: Payer: Self-pay | Admitting: Pharmacist

## 2023-04-28 DIAGNOSIS — Z1231 Encounter for screening mammogram for malignant neoplasm of breast: Secondary | ICD-10-CM

## 2023-04-28 DIAGNOSIS — G720 Drug-induced myopathy: Secondary | ICD-10-CM

## 2023-04-28 NOTE — Progress Notes (Unsigned)
[facility] Quality Pharmacy Team Statin Quality Measure Assessment  04/28/2023  [name] November 28, 1965 [id number]  Per review of chart and payor information, patient has a diagnosis of diabetes but is not currently filling a statin prescription.  This places patient into the Statin Use In Patients with Diabetes (SUPD) measure for CMS.    Patient has documented trials of statins   with reported myopathy , but no corresponding CPT codes that would exclude patient from SUPD measure.  Patient has an upcoming follow up appointment on 04/29/2023.  If deemed therapeutically appropriate, a statin exclusion code could be associated with the upcoming visit which will remove the patient from the measure.     Component Value Date/Time   CHOL 226 (H) 06/30/2017 1043   TRIG 86.0 06/30/2017 1043   HDL 74.30 06/30/2017 1043   CHOLHDL 3 06/30/2017 1043   VLDL 17.2 06/30/2017 1043   LDLCALC 134 (H) 06/30/2017 1043    Please consider ONE of the following recommendations:  Initiate high intensity statin Atorvastatin 40 mg once daily, #90, 3 refills   Rosuvastatin 20 mg once daily, #90, 3 refills    Initiate moderate intensity          statin with reduced frequency if prior          statin intolerance 1x weekly, #13, 3 refills   2x weekly, #26, 3 refills   3x weekly, #39, 3 refills    Code for past statin intolerance or  other exclusions (required annually)  Provider Requirements: Associate code during an office visit or telehealth encounter  Drug Induced Myopathy G72.0   Myopathy, unspecified G72.9   Myositis, unspecified M60.9   Rhabdomyolysis M62.82   Cirrhosis of liver K74.69   Prediabetes R73.03   PCOS E28.2    Plan:  Route note to the Primary Care Provider prior to upcoming appointment.  Beecher Mcardle, PharmD, BCACP [facility] Clinical Pharmacist [phone]

## 2023-04-29 ENCOUNTER — Ambulatory Visit (INDEPENDENT_AMBULATORY_CARE_PROVIDER_SITE_OTHER): Payer: Medicare HMO | Admitting: Nurse Practitioner

## 2023-04-29 ENCOUNTER — Encounter: Payer: Self-pay | Admitting: Nurse Practitioner

## 2023-04-29 VITALS — BP 120/50 | HR 65 | Temp 97.0°F | Ht 65.0 in | Wt 269.8 lb

## 2023-04-29 DIAGNOSIS — Z6841 Body Mass Index (BMI) 40.0 and over, adult: Secondary | ICD-10-CM | POA: Diagnosis not present

## 2023-04-29 DIAGNOSIS — E785 Hyperlipidemia, unspecified: Secondary | ICD-10-CM | POA: Diagnosis not present

## 2023-04-29 DIAGNOSIS — D1724 Benign lipomatous neoplasm of skin and subcutaneous tissue of left leg: Secondary | ICD-10-CM | POA: Diagnosis not present

## 2023-04-29 DIAGNOSIS — E1165 Type 2 diabetes mellitus with hyperglycemia: Secondary | ICD-10-CM

## 2023-04-29 DIAGNOSIS — J45909 Unspecified asthma, uncomplicated: Secondary | ICD-10-CM | POA: Diagnosis not present

## 2023-04-29 DIAGNOSIS — I10 Essential (primary) hypertension: Secondary | ICD-10-CM | POA: Diagnosis not present

## 2023-04-29 DIAGNOSIS — M5136 Other intervertebral disc degeneration, lumbar region: Secondary | ICD-10-CM | POA: Diagnosis not present

## 2023-04-29 HISTORY — DX: Hyperlipidemia, unspecified: E78.5

## 2023-04-29 LAB — POCT GLYCOSYLATED HEMOGLOBIN (HGB A1C): Hemoglobin A1C: 7.5 % — AB (ref 4.0–5.6)

## 2023-04-29 MED ORDER — BUDESONIDE-FORMOTEROL FUMARATE 160-4.5 MCG/ACT IN AERO: 2.0000 | INHALATION_SPRAY | Freq: Two times a day (BID) | RESPIRATORY_TRACT | 6 refills | Status: DC

## 2023-04-29 MED ORDER — SEMAGLUTIDE(0.25 OR 0.5MG/DOS) 2 MG/3ML ~~LOC~~ SOPN
0.2500 mg | PEN_INJECTOR | SUBCUTANEOUS | 0 refills | Status: DC
Start: 2023-04-29 — End: 2023-05-26

## 2023-04-29 NOTE — Assessment & Plan Note (Deleted)
Continue gabapentin 600 mg daily at bedtime

## 2023-04-29 NOTE — Patient Instructions (Addendum)
Please get your shingles vaccine and Tdap vaccines at the pharmacy.  Nurse please get records of diabetic eye exam from her ophthalmologist  1. Type 2 diabetes mellitus with hyperglycemia, without long-term current use of insulin (HCC)  - Microalbumin / creatinine urine ratio - POCT glycosylated hemoglobin (Hb A1C) - Semaglutide,0.25 or 0.5MG /DOS, 2 MG/3ML SOPN; Inject 0.25 mg into the skin once a week.  Dispense: 3 mL; Refill: 0  Goal for fasting blood sugar ranges from 80 to 120 and 2 hours after any meal or at bedtime should be between 130 to 170.    It is important that you exercise regularly at least 30 minutes 5 times a week as tolerated  Think about what you will eat, plan ahead. Choose " clean, green, fresh or frozen" over canned, processed or packaged foods which are more sugary, salty and fatty. 70 to 75% of food eaten should be vegetables and fruit. Three meals at set times with snacks allowed between meals, but they must be fruit or vegetables. Aim to eat over a 12 hour period , example 7 am to 7 pm, and STOP after  your last meal of the day. Drink water,generally about 64 ounces per day, no other drink is as healthy. Fruit juice is best enjoyed in a healthy way, by EATING the fruit.  Thanks for choosing [facility] we consider it a privelige to serve you.

## 2023-04-29 NOTE — Assessment & Plan Note (Signed)
Lab Results  Component Value Date   HGBA1C 7.5 (A) 04/29/2023  A1c has improved greatly from 14.23 months ago, patient congratulated on her efforts at getting her diabetes under control. We discussed starting a GLP-1 to help lose some weight and also for kidney and heart protection. Start Ozempic 0.25 mg once weekly injection, side effects of medication discussed Will plan to take patient off metformin if she tolerates Ozempic well Follow-up in 4 weeks Continue low-carb modified diet and monitor sugar, and courage to call the office to report hypoglycemia

## 2023-04-29 NOTE — Assessment & Plan Note (Signed)
She is currently on a Crestor, she is not sure of the dosage Continue current medication LDL goal of less than 70 discussed

## 2023-04-29 NOTE — Assessment & Plan Note (Signed)
Reports tenderness on palpation, no redness or drainage noted I discussed referral to general surgery, patient declined

## 2023-04-29 NOTE — Assessment & Plan Note (Signed)
BP Readings from Last 3 Encounters:  04/29/23 (!) 127/50  03/18/23 130/80  02/18/23 (!) 142/86   HTN Controlled continue current medications Continue current medications. No changes in management. Discussed DASH diet and dietary sodium restrictions Continue to increase dietary efforts and exercise.

## 2023-04-29 NOTE — Assessment & Plan Note (Signed)
Chronic condition, Worse with sitting , standing, walking. uses a cane for ambulation Continue gabapentin 600 mg at bedtime, meloxicam 7.5 mg daily as needed.  Moderate Exercise as tolerated recommended

## 2023-04-29 NOTE — Assessment & Plan Note (Signed)
Wt Readings from Last 3 Encounters:  04/29/23 269 lb 12.8 oz (122.4 kg)  03/18/23 275 lb 6.4 oz (124.9 kg)  02/18/23 276 lb 6.4 oz (125.4 kg)  She has lost about 6 pounds since her last visit Starting Ozempic 0.25 mg once weekly for type 2 diabetes Patient counseled on low-carb modified diet Encouraged to engage in regular moderate exercises as tolerated

## 2023-04-29 NOTE — Progress Notes (Signed)
Established Patient Office Visit  Subjective:  Patient ID: [name], female    DOB: 1966/02/29  Age: 57 y.o. MRN: [id number]  CC:  Chief Complaint  Patient presents with   Follow-up    HPI [name] is a 57 y.o. female  has a past medical history of Allergy, Asthma, Bronchitis, Diabetes (HCC), Diabetes mellitus without complication (HCC), GERD (gastroesophageal reflux disease), Hypertension, Obesity, and Sleep apnea.  Patient presents for follow-up for uncontrolled type 2 diabetes.  Uncontrolled type 2 diabetes.  Currently on metformin 1000 mg daily, she has been staying away from sugar, pasta does some exercises.  Fasting blood sugar has been around 80s to 90s.  Has occasional low blood sugars.  No complaints of polyphagia, polyuria, polydipsia.  She has restarted taking Crestor about a week ago, not sure of the dosage, she was encouraged to call the office to confirm the dosage of Crestor that she is taking.  She had initially stopped taking Crestor due to having muscle spasm, stated that her muscle spasms as improved since she started taking magnesium.   She was encouraged to get shingles vaccine and Tdap vaccine from the pharmacy.  Past Medical History:  Diagnosis Date   Allergy    Asthma    Bronchitis    Diabetes (HCC)    Diabetes mellitus without complication (HCC)    GERD (gastroesophageal reflux disease)    Hypertension    Obesity    Sleep apnea    uses c pap     Past Surgical History:  Procedure Laterality Date   DENTAL SURGERY     ECTOPIC PREGNANCY SURGERY  2006    Family History  Problem Relation Age of Onset   Hypertension Father    Asthma Father    Hypertension Sister    Hypertension Sister    Stroke Paternal Grandfather    Hypertension Paternal Grandfather    Colon cancer Neg Hx    Colon polyps Neg Hx    Esophageal cancer Neg Hx    Rectal cancer Neg Hx    Stomach cancer Neg Hx    Breast cancer Neg Hx     Social History    Socioeconomic History   Marital status: Married    Spouse name: Not on file   Number of children: 2   Years of education: Not on file   Highest education level: Not on file  Occupational History   Not on file  Tobacco Use   Smoking status: Never   Smokeless tobacco: Never  Vaping Use   Vaping status: Never Used  Substance and Sexual Activity   Alcohol use: Not Currently    Comment: rare   Drug use: No   Sexual activity: Yes    Birth control/protection: Post-menopausal    Comment: >5 sexual partners  Other Topics Concern   Not on file  Social History Narrative   Lives with her husband   Social Determinants of Health   Financial Resource Strain: Not on file  Food Insecurity: Not on file  Transportation Needs: Not on file  Physical Activity: Not on file  Stress: Not on file  Social Connections: Not on file  Intimate Partner Violence: Not on file    Outpatient Medications Prior to Visit  Medication Sig Dispense Refill   albuterol (PROVENTIL) (2.5 MG/3ML) 0.083% nebulizer solution Take 3 mLs (2.5 mg total) by nebulization every 6 (six) hours as needed for wheezing. 75 mL 12   albuterol (VENTOLIN HFA) 108 (90 Base)  MCG/ACT inhaler INHALE 2 PUFFS BY MOUTH EVERY 4 HOURS AS NEEDED FOR WHEEZE OR FOR SHORTNESS OF BREATH 36 each 3   albuterol (VENTOLIN HFA) 108 (90 Base) MCG/ACT inhaler Ventolin HFA     atenolol (TENORMIN) 100 MG tablet Take 100 mg by mouth daily.     cyclobenzaprine (FLEXERIL) 10 MG tablet Take 1 tablet (10 mg total) by mouth 2 (two) times daily as needed for muscle spasms. 20 tablet 0   furosemide (LASIX) 20 MG tablet Take 1 tablet (20 mg total) by mouth 2 (two) times daily as needed for up to 3 days. 6 tablet 0   gabapentin (NEURONTIN) 600 MG tablet Take 600 mg by mouth at bedtime.     hydrochlorothiazide (HYDRODIURIL) 12.5 MG tablet TAKE 1 TABLET (12.5 MG TOTAL) DAILY BY MOUTH. 30 tablet 3   HYDROcodone-acetaminophen (NORCO/VICODIN) 5-325 MG tablet Take 1  tablet by mouth 2 (two) times daily as needed for moderate pain.     ibuprofen (ADVIL,MOTRIN) 800 MG tablet Take 1 tablet (800 mg total) by mouth every 8 (eight) hours as needed. (Patient taking differently: Take 600 mg by mouth every 8 (eight) hours as needed.) 30 tablet 2   Magnesium 100 MG CAPS Take by mouth.     meloxicam (MOBIC) 7.5 MG tablet meloxicam     metFORMIN (GLUCOPHAGE) 500 MG tablet Take 1 tablet (500 mg total) by mouth daily with breakfast. 30 tablet 0   montelukast (SINGULAIR) 10 MG tablet TAKE 1 TABLET BY MOUTH EVERYDAY AT BEDTIME 30 tablet 3   Nebulizers (COMPRESSOR/NEBULIZER) MISC As directed 1 each 0   SYMBICORT 160-4.5 MCG/ACT inhaler INHALE 2 PUFFS BY MOUTH INTO THE LUNGS TWICE A DAY 10.2 each 6   clobetasol ointment (TEMOVATE) 0.05 % Apply 1 Application topically 2 (two) times daily. Apply a small amount topically twice daily for up to 2 weeks 60 g 0   No facility-administered medications prior to visit.    Allergies  Allergen Reactions   Seasonal Ic [Octacosanol]     ROS Review of Systems  Constitutional:  Negative for activity change, appetite change, chills, fatigue and fever.  HENT:  Negative for congestion, dental problem, ear discharge, ear pain, hearing loss, rhinorrhea, sinus pressure, sinus pain, sneezing and sore throat.   Eyes:  Negative for pain, discharge, redness and itching.  Respiratory:  Negative for cough, chest tightness, shortness of breath and wheezing.   Cardiovascular:  Negative for chest pain, palpitations and leg swelling.  Gastrointestinal:  Negative for abdominal distention, abdominal pain, anal bleeding, blood in stool, constipation and diarrhea.  Endocrine: Negative for cold intolerance, heat intolerance, polydipsia, polyphagia and polyuria.  Genitourinary:  Negative for difficulty urinating, dysuria, flank pain, frequency, hematuria, menstrual problem, pelvic pain and vaginal bleeding.  Musculoskeletal:  Positive for arthralgias.  Negative for back pain, gait problem, joint swelling and myalgias.  Skin:  Negative for color change, pallor, rash and wound.  Allergic/Immunologic: Negative for environmental allergies, food allergies and immunocompromised state.  Neurological:  Negative for dizziness, tremors, facial asymmetry, weakness and headaches.  Hematological:  Negative for adenopathy. Does not bruise/bleed easily.  Psychiatric/Behavioral:  Negative for agitation, behavioral problems, confusion, decreased concentration, hallucinations, self-injury and suicidal ideas.       Objective:    Physical Exam Vitals and nursing note reviewed.  Constitutional:      General: She is not in acute distress.    Appearance: Normal appearance. She is obese. She is not ill-appearing, toxic-appearing or diaphoretic.  HENT:  Mouth/Throat:     Mouth: Mucous membranes are moist.     Pharynx: Oropharynx is clear. No oropharyngeal exudate or posterior oropharyngeal erythema.  Eyes:     General: No scleral icterus.       Right eye: No discharge.        Left eye: No discharge.     Extraocular Movements: Extraocular movements intact.     Conjunctiva/sclera: Conjunctivae normal.  Cardiovascular:     Rate and Rhythm: Normal rate and regular rhythm.     Pulses: Normal pulses.     Heart sounds: Normal heart sounds. No murmur heard.    No friction rub. No gallop.  Pulmonary:     Effort: Pulmonary effort is normal. No respiratory distress.     Breath sounds: Normal breath sounds. No stridor. No wheezing, rhonchi or rales.  Chest:     Chest wall: No tenderness.  Abdominal:     General: There is no distension.     Palpations: Abdomen is soft.     Tenderness: There is no abdominal tenderness. There is no right CVA tenderness, left CVA tenderness or guarding.  Musculoskeletal:        General: No deformity or signs of injury.     Right lower leg: No edema.     Left lower leg: No edema.  Skin:    General: Skin is warm and dry.      Capillary Refill: Capillary refill takes less than 2 seconds.     Coloration: Skin is not jaundiced or pale.     Findings: No bruising, erythema or lesion.  Neurological:     Mental Status: She is alert and oriented to person, place, and time.     Motor: No weakness.     Coordination: Coordination normal.     Gait: Gait normal.  Psychiatric:        Mood and Affect: Mood normal.        Behavior: Behavior normal.        Thought Content: Thought content normal.        Judgment: Judgment normal.     BP (!) 127/50   Pulse 65   Temp (!) 97 F (36.1 C)   Ht 5\' 5"  (1.651 m)   Wt 269 lb 12.8 oz (122.4 kg)   LMP  (LMP Unknown)   SpO2 94%   BMI 44.90 kg/m  Wt Readings from Last 3 Encounters:  04/29/23 269 lb 12.8 oz (122.4 kg)  03/18/23 275 lb 6.4 oz (124.9 kg)  02/18/23 276 lb 6.4 oz (125.4 kg)    Lab Results  Component Value Date   TSH 1.42 01/31/2023   Lab Results  Component Value Date   WBC 5.9 01/31/2023   HGB 14.9 01/31/2023   HCT 45 01/31/2023   MCV 94.3 01/28/2023   PLT 254 01/31/2023   Lab Results  Component Value Date   NA 134 (A) 01/31/2023   K 4.0 01/28/2023   CO2 27 01/28/2023   GLUCOSE 397 (H) 01/28/2023   BUN 9 01/28/2023   CREATININE 0.7 01/31/2023   BILITOT 0.4 06/30/2017   ALKPHOS 78 06/30/2017   AST 38 (A) 01/31/2023   ALT 31 01/31/2023   PROT 7.3 06/30/2017   ALBUMIN 4.5 01/31/2023   CALCIUM 9.7 01/28/2023   ANIONGAP 14 01/28/2023   EGFR 98 01/31/2023   GFR 116.24 09/24/2017   Lab Results  Component Value Date   CHOL 209 (A) 02/27/2023   Lab Results  Component Value Date  HDL 74.30 06/30/2017   Lab Results  Component Value Date   LDLCALC 126 02/27/2023   Lab Results  Component Value Date   TRIG 120 02/27/2023   Lab Results  Component Value Date   CHOLHDL 3 06/30/2017   Lab Results  Component Value Date   HGBA1C 7.5 (A) 04/29/2023      Assessment & Plan:   Problem List Items Addressed This Visit        Cardiovascular and Mediastinum   HTN (hypertension)    BP Readings from Last 3 Encounters:  04/29/23 (!) 127/50  03/18/23 130/80  02/18/23 (!) 142/86   HTN Controlled continue current medications Continue current medications. No changes in management. Discussed DASH diet and dietary sodium restrictions Continue to increase dietary efforts and exercise.           Respiratory   Asthma   Relevant Medications   budesonide-formoterol (SYMBICORT) 160-4.5 MCG/ACT inhaler     Endocrine   Type 2 diabetes mellitus with hyperglycemia, without long-term current use of insulin (HCC) - Primary    Lab Results  Component Value Date   HGBA1C 7.5 (A) 04/29/2023  A1c has improved greatly from 14.23 months ago, patient congratulated on her efforts at getting her diabetes under control. We discussed starting a GLP-1 to help lose some weight and also for kidney and heart protection. Start Ozempic 0.25 mg once weekly injection, side effects of medication discussed Will plan to take patient off metformin if she tolerates Ozempic well Follow-up in 4 weeks Continue low-carb modified diet and monitor sugar, and courage to call the office to report hypoglycemia      Relevant Medications   Semaglutide,0.25 or 0.5MG /DOS, 2 MG/3ML SOPN   Other Relevant Orders   Microalbumin / creatinine urine ratio   POCT glycosylated hemoglobin (Hb A1C) (Completed)     Musculoskeletal and Integument   Lumbar degenerative disc disease    Chronic condition, Worse with sitting , standing, walking. uses a cane for ambulation Continue gabapentin 600 mg at bedtime, meloxicam 7.5 mg daily as needed.  Moderate Exercise as tolerated recommended        Other   Class 3 severe obesity due to excess calories with serious comorbidity and body mass index (BMI) of 45.0 to 49.9 in adult (HCC)    Wt Readings from Last 3 Encounters:  04/29/23 269 lb 12.8 oz (122.4 kg)  03/18/23 275 lb 6.4 oz (124.9 kg)  02/18/23 276 lb 6.4 oz  (125.4 kg)  She has lost about 6 pounds since her last visit Starting Ozempic 0.25 mg once weekly for type 2 diabetes Patient counseled on low-carb modified diet Encouraged to engage in regular moderate exercises as tolerated      Relevant Medications   Semaglutide,0.25 or 0.5MG /DOS, 2 MG/3ML SOPN   Dyslipidemia, goal LDL below 70    She is currently on a Crestor, she is not sure of the dosage Continue current medication LDL goal of less than 70 discussed       Relevant Orders   Lipid panel   Lipoma of left lower extremity    Reports tenderness on palpation, no redness or drainage noted I discussed referral to general surgery, patient declined       Meds ordered this encounter  Medications   Semaglutide,0.25 or 0.5MG /DOS, 2 MG/3ML SOPN    Sig: Inject 0.25 mg into the skin once a week.    Dispense:  3 mL    Refill:  0   budesonide-formoterol (SYMBICORT) 160-4.5  MCG/ACT inhaler    Sig: Inhale 2 puffs into the lungs 2 (two) times daily.    Dispense:  10.2 each    Refill:  6    Follow-up: Return in about 4 weeks (around 05/27/2023) for FASTING LABS THIS WEEK, DM.    Donell Beers, FNP

## 2023-04-30 ENCOUNTER — Telehealth: Payer: Self-pay

## 2023-04-30 LAB — MICROALBUMIN / CREATININE URINE RATIO
Creatinine, Urine: 132.7 mg/dL
Microalb/Creat Ratio: 7 mg/g creat (ref 0–29)
Microalbumin, Urine: 9.2 ug/mL

## 2023-04-30 NOTE — Telephone Encounter (Signed)
Left a message. Gh 

## 2023-05-01 ENCOUNTER — Telehealth: Payer: Self-pay

## 2023-05-01 ENCOUNTER — Other Ambulatory Visit: Payer: Self-pay

## 2023-05-01 DIAGNOSIS — E785 Hyperlipidemia, unspecified: Secondary | ICD-10-CM

## 2023-05-01 DIAGNOSIS — G4733 Obstructive sleep apnea (adult) (pediatric): Secondary | ICD-10-CM | POA: Diagnosis not present

## 2023-05-02 LAB — LIPID PANEL
Chol/HDL Ratio: 2.4 ratio (ref 0.0–4.4)
Cholesterol, Total: 128 mg/dL (ref 100–199)
HDL: 53 mg/dL (ref >39)
LDL Chol Calc (NIH): 61 mg/dL (ref 0–99)
Triglycerides: 69 mg/dL (ref 0–149)
VLDL Cholesterol Cal: 14 mg/dL (ref 5–40)

## 2023-05-02 NOTE — Telephone Encounter (Signed)
Called pt and  left a message for a call back. Gh 

## 2023-05-05 ENCOUNTER — Telehealth: Payer: Self-pay

## 2023-05-05 NOTE — Telephone Encounter (Signed)
Called pt advise message. Gh

## 2023-05-05 NOTE — Telephone Encounter (Signed)
Called pt and inform results and instructions, pt verbalized understand. Gh

## 2023-05-12 ENCOUNTER — Telehealth: Payer: Self-pay | Admitting: Nurse Practitioner

## 2023-05-12 NOTE — Telephone Encounter (Signed)
Spoke with patient to schedule AWV.  She asked if someone can call her in reference to questions about recent medication changes   Thank you,  Judeth Cornfield,  AMB Clinical Support [facility] AWV Program Direct Dial ??[phone]

## 2023-05-13 NOTE — Telephone Encounter (Signed)
Results have been relayed to the patient/authorized caretaker. The patient/authorized caretaker verbalized understanding. No questions at this time.  Already had appt for 10/4 scheduled.

## 2023-05-20 ENCOUNTER — Other Ambulatory Visit: Payer: Self-pay | Admitting: Nurse Practitioner

## 2023-05-20 ENCOUNTER — Encounter: Payer: Medicare HMO | Attending: Nurse Practitioner | Admitting: Dietician

## 2023-05-20 ENCOUNTER — Encounter: Payer: Self-pay | Admitting: Dietician

## 2023-05-20 VITALS — Wt 272.4 lb

## 2023-05-20 DIAGNOSIS — E119 Type 2 diabetes mellitus without complications: Secondary | ICD-10-CM | POA: Insufficient documentation

## 2023-05-20 DIAGNOSIS — E1165 Type 2 diabetes mellitus with hyperglycemia: Secondary | ICD-10-CM | POA: Diagnosis not present

## 2023-05-20 NOTE — Progress Notes (Signed)
Patient was seen on 05/20/23 for the first of a series of three diabetes self-management courses at [facility].  Patient Education Plan per assessed needs and concerns is to attend three course education program for Diabetes Self Management Education.  A1C was 7.5 on 04/29/23. Pt states A1c came down from 14.2 on 01/31/23.   The following learning objectives were met by the patient during this class: Describe diabetes, types of diabetes and pathophysiology State some common risk factors for diabetes Defines the role of glucose and insulin Describe the relationship between diabetes and cardiovascular and other risks State the members of the Healthcare Team States the rationale for glucose monitoring and when to test State their individual Target Range State the importance of logging glucose readings and how to interpret the readings Identifies A1C target Explain the correlation between A1c and eAG values State symptoms and treatment of high blood glucose and low blood glucose Explain proper technique for glucose testing and identify proper sharps disposal  Handouts given during class include: How to Thrive:  A Guide for Your Journey with Diabetes by the ADA Meal Plan Card and carbohydrate content list Dietary intake form Low Sodium Flavoring Tips Types of Fats Dining Out Label reading Snack list The diabetes portion plate Diabetes Resources A1c to eAG Conversion Chart Blood Glucose Log Diabetes Recommended Care Schedule Support Group Diabetes Success Plan Core Class Satisfaction Survey   Follow-Up Plan: Attend core 2

## 2023-05-23 ENCOUNTER — Other Ambulatory Visit: Payer: Self-pay | Admitting: Nurse Practitioner

## 2023-05-23 ENCOUNTER — Other Ambulatory Visit: Payer: Self-pay

## 2023-05-23 ENCOUNTER — Telehealth: Payer: Self-pay | Admitting: Nurse Practitioner

## 2023-05-23 DIAGNOSIS — E119 Type 2 diabetes mellitus without complications: Secondary | ICD-10-CM

## 2023-05-23 MED ORDER — ONETOUCH VERIO VI STRP
ORAL_STRIP | 1 refills | Status: DC
Start: 2023-05-23 — End: 2023-05-23

## 2023-05-23 NOTE — Telephone Encounter (Signed)
Caller & Relationship to patient:  MRN #  [id number]   Call Back Number:   Date of Last Office Visit: 05/20/2023     Date of Next Office Visit: 05/29/2023    Medication(s) to be Refilled: one touch strip  Lancets  [facility]  ** Please notify patient to allow 48-72 hours to process** **Let patient know to contact pharmacy at the end of the day to make sure medication is ready. ** **If patient has not been seen in a year or longer, book an appointment **Advise to use MyChart for refill requests OR to contact their pharmacy

## 2023-05-24 ENCOUNTER — Other Ambulatory Visit: Payer: Self-pay | Admitting: Nurse Practitioner

## 2023-05-24 DIAGNOSIS — E1165 Type 2 diabetes mellitus with hyperglycemia: Secondary | ICD-10-CM

## 2023-05-26 ENCOUNTER — Other Ambulatory Visit: Payer: Self-pay

## 2023-05-26 DIAGNOSIS — E119 Type 2 diabetes mellitus without complications: Secondary | ICD-10-CM

## 2023-05-26 MED ORDER — ONETOUCH VERIO VI STRP
ORAL_STRIP | 1 refills | Status: DC
Start: 2023-05-26 — End: 2024-05-24

## 2023-05-27 ENCOUNTER — Encounter: Payer: Self-pay | Admitting: Dietician

## 2023-05-27 ENCOUNTER — Encounter: Payer: Medicare HMO | Attending: Nurse Practitioner | Admitting: Dietician

## 2023-05-27 DIAGNOSIS — E1165 Type 2 diabetes mellitus with hyperglycemia: Secondary | ICD-10-CM | POA: Insufficient documentation

## 2023-05-27 NOTE — Progress Notes (Signed)
Patient was seen on 05/27/23 for the second of a series of three diabetes self-management courses at the [facility]. The following learning objectives were met by the patient during this class:  Describe the role of different macronutrients on glucose Explain how carbohydrates affect blood glucose State what foods contain the most carbohydrates Demonstrate carbohydrate counting Demonstrate how to read Nutrition Facts food label Describe effects of various fats on heart health Describe the importance of good nutrition for health and healthy eating strategies Describe techniques for managing your shopping, cooking and meal planning List strategies to follow meal plan when dining out Describe the effects of alcohol on glucose and how to use it safely  Goals:  Follow Diabetes Meal Plan as instructed  Aim to spread carbs evenly throughout the day  Aim for 3 meals per day and snacks as needed Include lean protein foods to meals/snacks  Monitor glucose levels as instructed by your doctor   Follow-Up Plan: Attend Core 3 Work towards following your personal food plan.

## 2023-05-29 ENCOUNTER — Ambulatory Visit (INDEPENDENT_AMBULATORY_CARE_PROVIDER_SITE_OTHER): Payer: Medicare HMO

## 2023-05-29 ENCOUNTER — Encounter: Payer: Self-pay | Admitting: Pharmacist

## 2023-05-29 VITALS — Ht 65.0 in | Wt 267.0 lb

## 2023-05-29 DIAGNOSIS — G720 Drug-induced myopathy: Secondary | ICD-10-CM

## 2023-05-29 DIAGNOSIS — Z789 Other specified health status: Secondary | ICD-10-CM

## 2023-05-29 DIAGNOSIS — E119 Type 2 diabetes mellitus without complications: Secondary | ICD-10-CM

## 2023-05-29 DIAGNOSIS — Z5941 Food insecurity: Secondary | ICD-10-CM

## 2023-05-29 DIAGNOSIS — Z0000 Encounter for general adult medical examination without abnormal findings: Secondary | ICD-10-CM

## 2023-05-29 DIAGNOSIS — Z1159 Encounter for screening for other viral diseases: Secondary | ICD-10-CM

## 2023-05-29 NOTE — Progress Notes (Signed)
[facility]) [facility] Quality Pharmacy Team Statin Quality Measure Assessment  05/29/2023  Jordis Damonique [place] 1966/07/28 [id number]  Per review of chart and payor information, Ms. Chern has a diagnosis of diabetes but is not currently filling a statin prescription.  This places patient into the Statin Use In Patients with Diabetes (SUPD) measure for CMS.    Patient has documented trials of rosuvastatin with reported muscle pain, but no corresponding CPT codes that would exclude patient from SUPD measure.  Please consider evaluating her past statin intolerance and code accordingly at tomorrow's office visit.  Code for past statin intolerance or  other exclusions (required annually)  Provider Requirements: Associate code during an office visit or telehealth encounter  Drug Induced Myopathy G72.0   Myopathy, unspecified G72.9   Myositis, unspecified M60.9   Rhabdomyolysis M62.82   Cirrhosis of liver K74.69   Prediabetes R73.03   PCOS E28.2   Thank you for allowing [facility] pharmacy to be a part of this patient's care.  Dellie Burns, PharmD Clinical Pharmacist Outlook  Direct Dial: [phone]

## 2023-05-29 NOTE — Progress Notes (Signed)
Because this visit was a virtual/telehealth visit,  certain criteria was not obtained, such a blood pressure, CBG if patient is a diabetic, and timed get up and go. Any medications not marked as "taking" was not mentioned during the medication reconciliation part of the visit. Any vitals not documented were not able to be obtained due to this being a telehealth visit. Vitals that have been documented are verbally provided by the patient.  Patient was unable to self-report a recent blood pressure reading due to a lack of equipment at home via telehealth.  Subjective:   [name] is a 57 y.o. female who presents for an Initial Medicare Annual Wellness Visit.  Visit Complete: Virtual  I connected with  [name] on 05/29/23 by a audio enabled telemedicine application and verified that I am speaking with the correct person using two identifiers.  Patient Location: Home  Provider Location: Home Office  I discussed the limitations of evaluation and management by telemedicine. The patient expressed understanding and agreed to proceed.  Patient Medicare AWV questionnaire was completed by the patient on na; I have confirmed that all information answered by patient is correct and no changes since this date.  Review of Systems     Cardiac Risk Factors include: advanced age (>77men, >21 women);diabetes mellitus;dyslipidemia;hypertension;obesity (BMI >30kg/m2);sedentary lifestyle     Objective:    Today's Vitals   05/29/23 0957 05/29/23 0958  Weight: 267 lb (121.1 kg)   Height: 5\' 5"  (1.651 m)   PainSc:  8    Body mass index is 44.43 kg/m.     05/29/2023    9:56 AM 05/20/2023    2:25 PM 07/08/2016    6:04 PM 10/26/2015    9:13 AM 10/12/2015    3:26 PM  Advanced Directives  Does Patient Have a Medical Advance Directive? No No No No No  Would patient like information on creating a medical advance directive? No - Patient declined Yes (MAU/Ambulatory/Procedural Areas -  Information given) No - patient declined information Yes - Educational materials given     Current Medications (verified) Outpatient Encounter Medications as of 05/29/2023  Medication Sig   albuterol (PROVENTIL) (2.5 MG/3ML) 0.083% nebulizer solution Take 3 mLs (2.5 mg total) by nebulization every 6 (six) hours as needed for wheezing.   albuterol (VENTOLIN HFA) 108 (90 Base) MCG/ACT inhaler INHALE 2 PUFFS BY MOUTH EVERY 4 HOURS AS NEEDED FOR WHEEZE OR FOR SHORTNESS OF BREATH   albuterol (VENTOLIN HFA) 108 (90 Base) MCG/ACT inhaler Ventolin HFA   atenolol (TENORMIN) 100 MG tablet Take 100 mg by mouth daily.   budesonide-formoterol (SYMBICORT) 160-4.5 MCG/ACT inhaler Inhale 2 puffs into the lungs 2 (two) times daily.   clobetasol ointment (TEMOVATE) 0.05 % Apply 1 Application topically 2 (two) times daily. Apply a small amount topically twice daily for up to 2 weeks   cyclobenzaprine (FLEXERIL) 10 MG tablet Take 1 tablet (10 mg total) by mouth 2 (two) times daily as needed for muscle spasms.   furosemide (LASIX) 20 MG tablet Take 1 tablet (20 mg total) by mouth 2 (two) times daily as needed for up to 3 days.   gabapentin (NEURONTIN) 600 MG tablet Take 600 mg by mouth at bedtime.   glucose blood (ONETOUCH VERIO) test strip Use as instructed to check blood sugars twice daily and once additional if elevated.   hydrochlorothiazide (HYDRODIURIL) 12.5 MG tablet TAKE 1 TABLET (12.5 MG TOTAL) DAILY BY MOUTH.   HYDROcodone-acetaminophen (NORCO/VICODIN) 5-325 MG tablet Take 1  tablet by mouth 2 (two) times daily as needed for moderate pain.   ibuprofen (ADVIL,MOTRIN) 800 MG tablet Take 1 tablet (800 mg total) by mouth every 8 (eight) hours as needed. (Patient taking differently: Take 600 mg by mouth every 8 (eight) hours as needed.)   Magnesium 100 MG CAPS Take by mouth.   meloxicam (MOBIC) 7.5 MG tablet meloxicam   metFORMIN (GLUCOPHAGE) 500 MG tablet Take 1 tablet (500 mg total) by mouth daily with  breakfast.   montelukast (SINGULAIR) 10 MG tablet TAKE 1 TABLET BY MOUTH EVERYDAY AT BEDTIME   Nebulizers (COMPRESSOR/NEBULIZER) MISC As directed   Semaglutide,0.25 or 0.5MG /DOS, (OZEMPIC, 0.25 OR 0.5 MG/DOSE,) 2 MG/3ML SOPN INJECT 0.25MG  INTO THE SKIN ONE TIME PER WEEK   No facility-administered encounter medications on file as of 05/29/2023.    Allergies (verified) Seasonal ic [octacosanol]   History: Past Medical History:  Diagnosis Date   Allergy    Asthma    Bronchitis    Diabetes (HCC)    Diabetes mellitus without complication (HCC)    GERD (gastroesophageal reflux disease)    Hypertension    Obesity    Sleep apnea    uses c pap    Past Surgical History:  Procedure Laterality Date   DENTAL SURGERY     ECTOPIC PREGNANCY SURGERY  2006   Family History  Problem Relation Age of Onset   Hypertension Father    Asthma Father    Hypertension Sister    Hypertension Sister    Stroke Paternal Grandfather    Hypertension Paternal Grandfather    Colon cancer Neg Hx    Colon polyps Neg Hx    Esophageal cancer Neg Hx    Rectal cancer Neg Hx    Stomach cancer Neg Hx    Breast cancer Neg Hx    Social History   Socioeconomic History   Marital status: Married    Spouse name: Not on file   Number of children: 2   Years of education: Not on file   Highest education level: Not on file  Occupational History   Not on file  Tobacco Use   Smoking status: Never   Smokeless tobacco: Never  Vaping Use   Vaping status: Never Used  Substance and Sexual Activity   Alcohol use: Not Currently    Comment: rare   Drug use: No   Sexual activity: Yes    Birth control/protection: Post-menopausal    Comment: >5 sexual partners  Other Topics Concern   Not on file  Social History Narrative   Lives with her husband   Social Determinants of Health   Financial Resource Strain: Medium Risk (05/29/2023)   Overall Financial Resource Strain (CARDIA)    Difficulty of Paying Living  Expenses: Somewhat hard  Food Insecurity: Food Insecurity Present (05/29/2023)   Hunger Vital Sign    Worried About Running Out of Food in the Last Year: Often true    Ran Out of Food in the Last Year: Sometimes true  Transportation Needs: No Transportation Needs (05/29/2023)   PRAPARE - Administrator, Civil Service (Medical): No    Lack of Transportation (Non-Medical): No  Physical Activity: Insufficiently Active (05/29/2023)   Exercise Vital Sign    Days of Exercise per Week: 7 days    Minutes of Exercise per Session: 20 min  Stress: No Stress Concern Present (05/29/2023)   [employer] of Occupational Health - Occupational Stress Questionnaire    Feeling of Stress : Not  at all  Social Connections: Moderately Integrated (05/29/2023)   Social Connection and Isolation Panel [NHANES]    Frequency of Communication with Friends and Family: More than three times a week    Frequency of Social Gatherings with Friends and Family: More than three times a week    Attends Religious Services: More than 4 times per year    Active Member of [employer] or Organizations: No    Attends Engineer, structural: Never    Marital Status: Married    Tobacco Counseling Counseling given: Yes   Clinical Intake:  Pre-visit preparation completed: Yes  Pain : 0-10 Pain Score: 8  Pain Location: Back Pain Orientation: Lower, Right, Left Pain Descriptors / Indicators: Radiating, Constant, Aching, Shooting Pain Onset: More than a month ago Pain Frequency: Constant     BMI - recorded: 44.43 Nutritional Status: BMI > 30  Obese Nutritional Risks: None Diabetes: Yes CBG done?: No (telehealth visit. unable to obtain cbg. Patient states her CBG was 89 last night) Did pt. bring in CBG monitor from home?: No  How often do you need to have someone help you when you read instructions, pamphlets, or other written materials from your doctor or pharmacy?: 1 - Never  Interpreter Needed?:  No  Information entered by :: Abby , CMA   Activities of Daily Living    05/29/2023   10:16 AM  In your present state of health, do you have any difficulty performing the following activities:  Hearing? 0  Vision? 0  Difficulty concentrating or making decisions? 0  Walking or climbing stairs? 0  Dressing or bathing? 0  Doing errands, shopping? 0  Preparing Food and eating ? N  Using the Toilet? N  In the past six months, have you accidently leaked urine? N  Do you have problems with loss of bowel control? N  Managing your Medications? N  Managing your Finances? N  Housekeeping or managing your Housekeeping? N    Patient Care Team: Donell Beers, FNP as PCP - General (Nurse Practitioner) Huel Cote, MD as Obstetrician (Obstetrics and Gynecology) Meryl Dare, MD as Consulting Physician (Gastroenterology) Romualdo Bolk, MD (Inactive) as Consulting Physician (Obstetrics and Gynecology)  Indicate any recent Medical Services you may have received from other than [facility] providers in the past year (date may be approximate).     Assessment:   This is a routine wellness examination for Sherilyn.  Hearing/Vision screen Hearing Screening - Comments:: Patient denies any hearing difficulties.   Vision Screening - Comments:: Wears rx glasses - up to date with routine eye exams  [facility] [place] the mall.  Dietary issues and exercise activities discussed:     Goals Addressed             This Visit's Progress    Patient Stated       Work towards being able to walk without the assistance of a cane and to be able to discontinue Metformin.       Depression Screen    05/29/2023   10:05 AM 05/20/2023    2:24 PM 03/18/2023   10:09 AM 09/24/2017   11:36 AM 08/29/2017   11:37 AM 06/30/2017   10:11 AM 06/14/2016    8:04 AM  PHQ 2/9 Scores  PHQ - 2 Score 1 0 0 0 0 0 0  PHQ- 9 Score 4  4 0 0 0     Fall Risk    05/29/2023   10:16 AM  05/20/2023  2:24 PM 03/18/2023   10:08 AM 09/24/2017   11:36 AM 06/30/2017   10:11 AM  Fall Risk   Falls in the past year? 1 1 1  No No  Number falls in past yr: 1 0 0    Injury with Fall? 1  1    Comment   Shoulder injury    Risk for fall due to : History of fall(s);Impaired balance/gait;Impaired mobility;Orthopedic patient  Impaired balance/gait    Follow up Education provided;Falls prevention discussed  Falls evaluation completed      MEDICARE RISK AT HOME:  Medicare Risk at Home - 05/29/23 1014     Any stairs in or around the home? No    If so, are there any without handrails? No    Home free of loose throw rugs in walkways, pet beds, electrical cords, etc? Yes    Adequate lighting in your home to reduce risk of falls? Yes    Life alert? No    Use of a cane, walker or w/c? Yes    Grab bars in the bathroom? No    Shower chair or bench in shower? No    Elevated toilet seat or a handicapped toilet? No             TIMED UP AND GO:  Was the test performed? No    Cognitive Function:        05/29/2023   10:03 AM  6CIT Screen  What Year? 0 points  What month? 0 points  What time? 0 points  Count back from 20 0 points  Months in reverse 0 points  Repeat phrase 0 points  Total Score 0 points    Immunizations Immunization History  Administered Date(s) Administered   Influenza Split 07/09/2011   Influenza Whole 07/31/2012   Influenza,inj,Quad PF,6+ Mos 07/01/2013, 07/29/2014, 08/25/2015, 06/14/2016, 06/30/2017, 08/30/2021   Influenza-Unspecified 07/27/2019   PFIZER(Purple Top)SARS-COV-2 Vaccination 01/02/2020, 01/23/2020   Tdap 07/09/2011    TDAP status: Due, Education has been provided regarding the importance of this vaccine. Advised may receive this vaccine at local pharmacy or Health Dept. Aware to provide a copy of the vaccination record if obtained from local pharmacy or Health Dept. Verbalized acceptance and understanding.  Flu Vaccine status: Due,  Education has been provided regarding the importance of this vaccine. Advised may receive this vaccine at local pharmacy or Health Dept. Aware to provide a copy of the vaccination record if obtained from local pharmacy or Health Dept. Verbalized acceptance and understanding.  Pneumococcal vaccine status: NOT AGE APPROPRIATE FOR THIS PATIENT   Covid-19 vaccine status: Information provided on how to obtain vaccines.   Qualifies for Shingles Vaccine? Yes   Zostavax completed No   Shingrix Completed?: No.    Education has been provided regarding the importance of this vaccine. Patient has been advised to call insurance company to determine out of pocket expense if they have not yet received this vaccine. Advised may also receive vaccine at local pharmacy or Health Dept. Verbalized acceptance and understanding.  Screening Tests Health Maintenance  Topic Date Due   Medicare Annual Wellness (AWV)  Never done   OPHTHALMOLOGY EXAM  Never done   Hepatitis C Screening  Never done   Zoster Vaccines- Shingrix (1 of 2) Never done   DTaP/Tdap/Td (2 - Td or Tdap) 07/08/2021   COVID-19 Vaccine (3 - 2023-24 season) 06/14/2022   INFLUENZA VACCINE  05/15/2023   MAMMOGRAM  06/08/2023   HEMOGLOBIN A1C  10/30/2023   Diabetic kidney  evaluation - eGFR measurement  01/31/2024   Diabetic kidney evaluation - Urine ACR  04/28/2024   FOOT EXAM  04/28/2024   PAP SMEAR-Modifier  08/19/2025   Colonoscopy  10/18/2025   HIV Screening  Completed   HPV VACCINES  Aged Out    Health Maintenance  Health Maintenance Due  Topic Date Due   Medicare Annual Wellness (AWV)  Never done   OPHTHALMOLOGY EXAM  Never done   Hepatitis C Screening  Never done   Zoster Vaccines- Shingrix (1 of 2) Never done   DTaP/Tdap/Td (2 - Td or Tdap) 07/08/2021   COVID-19 Vaccine (3 - 2023-24 season) 06/14/2022   INFLUENZA VACCINE  05/15/2023    Colorectal cancer screening: Type of screening: Cologuard. Completed 03/17/2023. Repeat every  3 years  Mammogram status: Completed 06/07/2022. Repeat every year Patient is scheduled for yearly mammogram on 06/10/2023 Bone Density Screening: NOT AGE APPROPRIATE FOR THIS PATIENT   Lung Cancer Screening: (Low Dose CT Chest recommended if Age 41-80 years, 20 pack-year currently smoking OR have quit w/in 15years.) does not qualify.   Additional Screening:  Hepatitis C Screening: does qualify; Ordered 05/29/2023  Vision Screening: Recommended annual ophthalmology exams for early detection of glaucoma and other disorders of the eye. Is the patient up to date with their annual eye exam?  Yes  Who is the provider or what is the name of the office in which the patient attends annual eye exams? [facility] If pt is not established with a provider, would they like to be referred to a provider to establish care? No .   Dental Screening: Recommended annual dental exams for proper oral hygiene  Diabetic Foot Exam: Diabetic Foot Exam: Completed 04/29/2023  Community Resource Referral / Chronic Care Management: CRR required this visit?  Yes   CCM required this visit?  No     Plan:     I have personally reviewed and noted the following in the patient's chart:   Medical and social history Use of alcohol, tobacco or illicit drugs  Current medications and supplements including opioid prescriptions. Patient is not currently taking opioid prescriptions. Functional ability and status Nutritional status Physical activity Advanced directives List of other physicians Hospitalizations, surgeries, and ER visits in previous 12 months Vitals Screenings to include cognitive, depression, and falls Referrals and appointments  In addition, I have reviewed and discussed with patient certain preventive protocols, quality metrics, and best practice recommendations. A written personalized care plan for preventive services as well as general preventive health recommendations were provided to patient.      Jordan Hawks , CMA   05/29/2023   After Visit Summary: (Mail) Due to this being a telephonic visit, the after visit summary with patients personalized plan was offered to patient via mail   Nurse Notes: CRR placed today for patient due to food insecurity as documented in SDOH. Patient is aware and is in agreement with treatment plan.

## 2023-05-29 NOTE — Patient Instructions (Signed)
[name] , Thank you for taking time to come for your Medicare Wellness Visit. I appreciate your ongoing commitment to your health goals. Please review the following plan we discussed and let me know if I can assist you in the future.   These are the goals we discussed:  Goals      Patient Stated     Work towards being able to walk without the assistance of a cane and to be able to discontinue Metformin.        This is a list of the screening recommended for you and due dates:  Health Maintenance  Topic Date Due   Eye exam for diabetics  Never done   Hepatitis C Screening  Never done   Zoster (Shingles) Vaccine (1 of 2) Never done   DTaP/Tdap/Td vaccine (2 - Td or Tdap) 07/08/2021   COVID-19 Vaccine (3 - 2023-24 season) 06/14/2022   Flu Shot  05/15/2023   Mammogram  06/08/2023   Hemoglobin A1C  10/30/2023   Yearly kidney function blood test for diabetes  01/31/2024   Yearly kidney health urinalysis for diabetes  04/28/2024   Complete foot exam   04/28/2024   Medicare Annual Wellness Visit  05/28/2024   Pap Smear  08/19/2025   Colon Cancer Screening  10/18/2025   Cologuard (Stool DNA test)  03/16/2026   HIV Screening  Completed   HPV Vaccine  Aged Out    Advanced directives: Advance directive discussed with you today. Even though you declined this today, please call our office should you change your mind, and we can give you the proper paperwork for you to fill out. Advance care planning is a way to make decisions about medical care that fits your values in case you are ever unable to make these decisions for yourself.  Information on Advanced Care Planning can be found at [facility] Advance Health Care Directives Advance Health Care Directives ([email or web address]    Conditions/risks identified:  [name] been referred to [facility]. If you have not heard from them in a week, call them to schedule your appointment.   [facility] at [facility] Address: [address] [place], [name], [place] [address] Phone: [phone]   A referral has been placed for you to [facility] for your food insecurities. Someone should contact you in the next couple of weeks.   A Hepatitis C Screening has been ordered for you today. You do not have to fast to have this lab drawn.    Next appointment: VIRTUAL/TELEPHONE APPOINTMENT Follow up in one year for your annual wellness visit. June 03, 2024 at 10:00am This appointment will be a telephone call. Please make sure that you are available anywhere between 20 minutes before or after your scheduled appointment time depending on how the nurses schedule is running that day. Please have a list of your current medications ready to review and a list of any other providers you may be seeing. We look forward to speaking with you again next year.    Preventive Care 40-64 Years, Female Preventive care refers to lifestyle choices and visits with your health care provider that can promote health and wellness. What does preventive care include? A yearly physical exam. This is also called an annual well check. Dental exams once or twice a year. Routine eye exams. Ask your health care provider how often you should have your eyes checked. Personal lifestyle choices, including: Daily care of  your teeth and gums. Regular physical activity. Eating a healthy diet. Avoiding tobacco and drug use. Limiting alcohol use. Practicing safe sex. Taking low-dose aspirin daily starting at age 23. Taking vitamin and mineral supplements as recommended by your health care provider. What happens during an annual well check? The services and screenings done by your health care provider during your annual well check will depend on your age, overall health, lifestyle risk factors, and family history of disease. Counseling  Your health care provider may ask you questions about your: Alcohol use. Tobacco use. Drug  use. Emotional well-being. Home and relationship well-being. Sexual activity. Eating habits. Work and work Astronomer. Method of birth control. Menstrual cycle. Pregnancy history. Screening  You may have the following tests or measurements: Height, weight, and BMI. Blood pressure. Lipid and cholesterol levels. These may be checked every 5 years, or more frequently if you are over 22 years old. Skin check. Lung cancer screening. You may have this screening every year starting at age 26 if you have a 30-pack-year history of smoking and currently smoke or have quit within the past 15 years. Fecal occult blood test (FOBT) of the stool. You may have this test every year starting at age 66. Flexible sigmoidoscopy or colonoscopy. You may have a sigmoidoscopy every 5 years or a colonoscopy every 10 years starting at age 66. Hepatitis C blood test. Hepatitis B blood test. Sexually transmitted disease (STD) testing. Diabetes screening. This is done by checking your blood sugar (glucose) after you have not eaten for a while (fasting). You may have this done every 1-3 years. Mammogram. This may be done every 1-2 years. Talk to your health care provider about when you should start having regular mammograms. This may depend on whether you have a family history of breast cancer. BRCA-related cancer screening. This may be done if you have a family history of breast, ovarian, tubal, or peritoneal cancers. Pelvic exam and Pap test. This may be done every 3 years starting at age 38. Starting at age 31, this may be done every 5 years if you have a Pap test in combination with an HPV test. Bone density scan. This is done to screen for osteoporosis. You may have this scan if you are at high risk for osteoporosis. Discuss your test results, treatment options, and if necessary, the need for more tests with your health care provider. Vaccines  Your health care provider may recommend certain vaccines, such  as: Influenza vaccine. This is recommended every year. Tetanus, diphtheria, and acellular pertussis (Tdap, Td) vaccine. You may need a Td booster every 10 years. Zoster vaccine. You may need this after age 67. Pneumococcal 13-valent conjugate (PCV13) vaccine. You may need this if you have certain conditions and were not previously vaccinated. Pneumococcal polysaccharide (PPSV23) vaccine. You may need one or two doses if you smoke cigarettes or if you have certain conditions. Talk to your health care provider about which screenings and vaccines you need and how often you need them. This information is not intended to replace advice given to you by your health care provider. Make sure you discuss any questions you have with your health care provider. Document Released: 10/27/2015 Document Revised: 06/19/2016 Document Reviewed: 08/01/2015 Elsevier Interactive Patient Education  2017 [employer].    Fall Prevention in the Home Falls can cause injuries. They can happen to people of all ages. There are many things you can do to make your home safe and to help prevent falls. What can  I do on the outside of my home? Regularly fix the edges of walkways and driveways and fix any cracks. Remove anything that might make you trip as you walk through a door, such as a raised step or threshold. Trim any bushes or trees on the path to your home. Use bright outdoor lighting. Clear any walking paths of anything that might make someone trip, such as rocks or tools. Regularly check to see if handrails are loose or broken. Make sure that both sides of any steps have handrails. Any raised decks and porches should have guardrails on the edges. Have any leaves, snow, or ice cleared regularly. Use sand or salt on walking paths during winter. Clean up any spills in your garage right away. This includes oil or grease spills. What can I do in the bathroom? Use night lights. Install grab bars by the toilet and in  the tub and shower. Do not use towel bars as grab bars. Use non-skid mats or decals in the tub or shower. If you need to sit down in the shower, use a plastic, non-slip stool. Keep the floor dry. Clean up any water that spills on the floor as soon as it happens. Remove soap buildup in the tub or shower regularly. Attach bath mats securely with double-sided non-slip rug tape. Do not have throw rugs and other things on the floor that can make you trip. What can I do in the bedroom? Use night lights. Make sure that you have a light by your bed that is easy to reach. Do not use any sheets or blankets that are too big for your bed. They should not hang down onto the floor. Have a firm chair that has side arms. You can use this for support while you get dressed. Do not have throw rugs and other things on the floor that can make you trip. What can I do in the kitchen? Clean up any spills right away. Avoid walking on wet floors. Keep items that you use a lot in easy-to-reach places. If you need to reach something above you, use a strong step stool that has a grab bar. Keep electrical cords out of the way. Do not use floor polish or wax that makes floors slippery. If you must use wax, use non-skid floor wax. Do not have throw rugs and other things on the floor that can make you trip. What can I do with my stairs? Do not leave any items on the stairs. Make sure that there are handrails on both sides of the stairs and use them. Fix handrails that are broken or loose. Make sure that handrails are as long as the stairways. Check any carpeting to make sure that it is firmly attached to the stairs. Fix any carpet that is loose or worn. Avoid having throw rugs at the top or bottom of the stairs. If you do have throw rugs, attach them to the floor with carpet tape. Make sure that you have a light switch at the top of the stairs and the bottom of the stairs. If you do not have them, ask someone to add them  for you. What else can I do to help prevent falls? Wear shoes that: Do not have high heels. Have rubber bottoms. Are comfortable and fit you well. Are closed at the toe. Do not wear sandals. If you use a stepladder: Make sure that it is fully opened. Do not climb a closed stepladder. Make sure that both sides of the  stepladder are locked into place. Ask someone to hold it for you, if possible. Clearly mark and make sure that you can see: Any grab bars or handrails. First and last steps. Where the edge of each step is. Use tools that help you move around (mobility aids) if they are needed. These include: Canes. Walkers. Scooters. Crutches. Turn on the lights when you go into a dark area. Replace any light bulbs as soon as they burn out. Set up your furniture so you have a clear path. Avoid moving your furniture around. If any of your floors are uneven, fix them. If there are any pets around you, be aware of where they are. Review your medicines with your doctor. Some medicines can make you feel dizzy. This can increase your chance of falling. Ask your doctor what other things that you can do to help prevent falls. This information is not intended to replace advice given to you by your health care provider. Make sure you discuss any questions you have with your health care provider. Document Released: 07/27/2009 Document Revised: 03/07/2016 Document Reviewed: 11/04/2014 Elsevier Interactive Patient Education  2017 Elsevier Inc.  Diabetes Mellitus and Foot Care Diabetes, also called diabetes mellitus, may cause problems with your feet and legs because of poor blood flow (circulation). Poor circulation may make your skin: Become thinner and drier. Break more easily. Heal more slowly. Peel and crack. You may also have nerve damage (neuropathy). This can cause decreased feeling in your legs and feet. This means that you may not notice minor injuries to your feet that could lead to more  serious problems. Finding and treating problems early is the best way to prevent future foot problems. How to care for your feet Foot hygiene  Wash your feet daily with warm water and mild soap. Do not use hot water. Then, pat your feet and the areas between your toes until they are fully dry. Do not soak your feet. This can dry your skin. Trim your toenails straight across. Do not dig under them or around the cuticle. File the edges of your nails with an emery board or nail file. Apply a moisturizing lotion or petroleum jelly to the skin on your feet and to dry, brittle toenails. Use lotion that does not contain alcohol and is unscented. Do not apply lotion between your toes. Shoes and socks Wear clean socks or stockings every day. Make sure they are not too tight. Do not wear knee-high stockings. These may decrease blood flow to your legs. Wear shoes that fit well and have enough cushioning. Always look in your shoes before you put them on to be sure there are no objects inside. To break in new shoes, wear them for just a few hours a day. This prevents injuries on your feet. Wounds, scrapes, corns, and calluses  Check your feet daily for blisters, cuts, bruises, sores, and redness. If you cannot see the bottom of your feet, use a mirror or ask someone for help. Do not cut off corns or calluses or try to remove them with medicine. If you find a minor scrape, cut, or break in the skin on your feet, keep it and the skin around it clean and dry. You may clean these areas with mild soap and water. Do not clean the area with peroxide, alcohol, or iodine. If you have a wound, scrape, corn, or callus on your foot, look at it several times a day to make sure it is healing and not infected.  Check for: Redness, swelling, or pain. Fluid or blood. Warmth. Pus or a bad smell. General tips Do not cross your legs. This may decrease blood flow to your feet. Do not use heating pads or hot water bottles on  your feet. They may burn your skin. If you have lost feeling in your feet or legs, you may not know this is happening until it is too late. Protect your feet from hot and cold by wearing shoes, such as at the beach or on hot pavement. Schedule a complete foot exam at least once a year or more often if you have foot problems. Report any cuts, sores, or bruises to your health care provider right away. Where to find more information American Diabetes Association: diabetes.org Association of Diabetes Care & Education Specialists: diabeteseducator.org Contact a health care provider if: You have a condition that increases your risk of infection, and you have any cuts, sores, or bruises on your feet. You have an injury that is not healing. You have redness on your legs or feet. You feel burning or tingling in your legs or feet. You have pain or cramps in your legs and feet. Your legs or feet are numb. Your feet always feel cold. You have pain around any toenails. Get help right away if: You have a wound, scrape, corn, or callus on your foot and: You have signs of infection. You have a fever. You have a red line going up your leg. This information is not intended to replace advice given to you by your health care provider. Make sure you discuss any questions you have with your health care provider. Document Revised: 04/03/2022 Document Reviewed: 04/03/2022 Elsevier Patient Education  2024 Elsevier Inc.  Screening for Type 2 Diabetes  A screening test for type 2 diabetes (type 2 diabetes mellitus) is a blood test to measure your blood sugar (glucose) level. This test is done to check for early signs of diabetes, before you develop symptoms.  Type 2 diabetes is a long-term (chronic) disease. In type 2 diabetes, one or both of these problems may be present: The pancreas does not make enough of a hormone called insulin. Cells in the body do not respond properly to insulin that the body makes  (insulin resistance). Normally, insulin allows blood sugar (glucose) to enter cells in the body. The cells use glucose for energy. Insulin resistance or lack of insulin causes excess glucose to build up in the blood instead of going into cells. This results in high blood glucose levels (hyperglycemia), which can cause many complications. You may be screened for type 2 diabetes as part of your regular health care, especially if you have a high risk for diabetes. Screening can help to identify type 2 diabetes at its early stage (prediabetes). Identifying and treating prediabetes may delay or prevent the development of type 2 diabetes. Tell a health care provider about: All medicines you are taking, including vitamins, herbs, eye drops, creams, and over-the-counter medicines. Any bleeding problems you have. Any medical conditions you have. Whether you are pregnant or may be pregnant. Who should be screened for type 2 diabetes? Adults Adults age 61 and older. These adults should be screened once every three years. Adults who are any age, are overweight, and have one other risk factor. These adults should be screened once every three years. Adults who have normal blood glucose levels and two or more risk factors. These adults may be screened once every year (annually). Women who have had gestational  diabetes in the past. These women should be screened once every three years. Pregnant women who have risk factors. These women should be screened at their first prenatal visit and again between weeks 24 and 28 of pregnancy. Children and adolescents Children and adolescents should be screened for type 2 diabetes if they are overweight and have any of the following risk factors: A family history of type 2 diabetes. Being a member of a high-risk ethnic group. Signs of insulin resistance or conditions that are associated with insulin resistance. A mother who had gestational diabetes while pregnant. Screening  should be done at least once every three years, starting at age 63 or at the onset of puberty, whichever comes first. Your health care provider or your child's health care provider may recommend having a screening more or less often. What are the risk factors for type 2 diabetes? The following are factors that may make you more likely to develop type 2 diabetes and can be modified: Not getting enough exercise. Having high blood pressure. Having low levels of good cholesterol (HDL-C) or high levels of blood fats (triglycerides). Having high blood glucose in a previous blood test. Being overweight or obese. The following are factors that may make you more likely to develop type 2 diabetes and can not be modified: Having a parent or sibling (first-degree relative) who has diabetes. Being of American-Indian, African-American, Hispanic/Latino, Asian, or Pacific Islander descent. Being older than age 24. Having a history of diabetes during pregnancy (gestational diabetes). Having certain diseases or conditions that may be caused by insulin resistance, including: Acanthosis nigricans. This is a condition that causes dark skin on the neck, armpits, and groin. Polycystic ovary syndrome (PCOS). Cardiovascular heart disease. What happens during screening? During screening, your health care provider may ask questions about: Your health and your risk factors, including your activity level and any medical conditions that you have. The health of your first-degree relatives. Past pregnancies, if this applies. Your health care provider will also do a physical exam, including a blood pressure measurement and blood tests. There are four blood tests that can be used to screen for type 2 diabetes. You may have one or more of the following: A fasting blood glucose (FBG) test. You will not be allowed to eat (you will fast) for 8 hours or more before a blood sample is taken. A random blood glucose test. This test  checks your blood glucose at any time of the day regardless of when you ate. An oral glucose tolerance test (OGTT). This test measures your blood glucose at two times: After you have not eaten (have fasted) overnight. This is your baseline glucose level. Two hours after you drink a glucose-containing beverage. An A1C (hemoglobin A1C) blood test. This test provides information about blood glucose control over the previous 2-3 months. What do the results mean? Your test results are a measurement of how much glucose is in your blood. Normal blood glucose levels mean that you do not have diabetes or prediabetes. High blood glucose levels may mean that you have prediabetes or diabetes. Depending on the results, other tests may be needed to confirm the diagnosis. You may be diagnosed with type 2 diabetes if: Your FBG level is 126 mg/dL (7.0 mmol/L) or higher. Your random blood glucose level is 200 mg/dL (96.2 mmol/L) or higher. Your A1C level is 6.5% or higher. Your OGTT result is higher than 200 mg/dL (95.2 mmol/L). These blood tests may be repeated to confirm your diagnosis. Talk  with your health care provider about what your results mean. Summary A screening test for type 2 diabetes (type 2 diabetes mellitus) is a blood test to measure your blood sugar (glucose) level. Know what your risk factors are for developing type 2 diabetes. If you are at risk, get screening tests as often as told by your health care provider. Screening may help you identify type 2 diabetes at its early stage (prediabetes). Identifying and treating prediabetes may delay or prevent the development of type 2 diabetes. This information is not intended to replace advice given to you by your health care provider. Make sure you discuss any questions you have with your health care provider. Document Revised: 12/25/2020 Document Reviewed: 12/25/2020 Elsevier Patient Education  2024 [employer].

## 2023-05-30 ENCOUNTER — Telehealth: Payer: Self-pay | Admitting: *Deleted

## 2023-05-30 ENCOUNTER — Ambulatory Visit: Payer: Self-pay | Admitting: Nurse Practitioner

## 2023-05-30 NOTE — Progress Notes (Signed)
  Care Coordination   Note   05/30/2023 Name: [name] MRN: [id number] DOB: 14-Feb-1966  [name] is a 57 y.o. year old female who sees Paseda, Baird Kay, FNP for primary care. I reached out to Sharion Dove by phone today to offer care coordination services.  Ms. Casanas was given information about Care Coordination services today including:   The Care Coordination services include support from the care team which includes your Nurse Coordinator, Clinical Social Worker, or Pharmacist.  The Care Coordination team is here to help remove barriers to the health concerns and goals most important to you. Care Coordination services are voluntary, and the patient may decline or stop services at any time by request to their care team member.   Care Coordination Consent Status: Patient agreed to services and verbal consent obtained.   Follow up plan:  Telephone appointment with care coordination team member scheduled for:  06/06/2023  Encounter Outcome:  Pt. Scheduled from referral   Burman Nieves, [facility] Care Coordination Care Guide Direct Dial: [phone]

## 2023-06-01 DIAGNOSIS — G4733 Obstructive sleep apnea (adult) (pediatric): Secondary | ICD-10-CM | POA: Diagnosis not present

## 2023-06-03 ENCOUNTER — Encounter: Payer: Self-pay | Admitting: Dietician

## 2023-06-03 ENCOUNTER — Encounter: Payer: Medicare HMO | Admitting: Dietician

## 2023-06-03 DIAGNOSIS — E119 Type 2 diabetes mellitus without complications: Secondary | ICD-10-CM | POA: Diagnosis not present

## 2023-06-03 DIAGNOSIS — E1165 Type 2 diabetes mellitus with hyperglycemia: Secondary | ICD-10-CM | POA: Diagnosis not present

## 2023-06-03 NOTE — Progress Notes (Signed)
Patient was seen on 06/03/23 for the third of a series of three diabetes self[facility]management courses [facility].   State the amount of activity recommended for healthy living Describe activities suitable for individual needs Identify ways to regularly incorporate activity into daily life Identify barriers to activity and ways to over come these barriers Identify diabetes medications being personally used and their primary action for lowering glucose and possible side effects Describe role of stress on blood glucose and develop strategies to address psychosocial issues Identify diabetes complications and ways to prevent them Explain how to manage diabetes during illness Evaluate success in meeting personal goal Establish 2-3 goals that they will plan to diligently work on  Goals:  I will be active 30 minutes or more 3 times a week I will take my diabetes medications as scheduled To help manage stress I will  relax at least 3 times a week  Your patient has identified these potential barriers to change:  Motivation Finances Stress Lack of Family Support  Your patient has identified their diabetes self-care support plan as  Family Education officer, environmental Resources [facility] Support Group  American Diabetes Association Website    Plan:  Attend Support Group as desired

## 2023-06-06 ENCOUNTER — Ambulatory Visit: Payer: Self-pay

## 2023-06-06 NOTE — Patient Outreach (Signed)
  Care Coordination   Initial Visit Note   06/06/2023 Name: Quanesha Floyd MRN: [id number] DOB: 1966/01/21  Ola Kenna Tassinari is a 57 y.o. year old female who sees Paseda, Baird Kay, FNP for primary care. I spoke with  Sharion Dove by phone today.  What matters to the patients health and wellness today?  Patient declined the need for resource assistance. The patient requests advice on caregiver assistance for a family member. Patient advised to contact DSS to request assistance with a placement worker for her family member.   SDOH assessments and interventions completed:  Yes  SDOH Interventions Today    Flowsheet Row Most Recent Value  SDOH Interventions   Food Insecurity Interventions Patient Declined        Care Coordination Interventions:  Yes, provided   Interventions Today    Flowsheet Row Most Recent Value  Education Interventions   Education Provided Provided Education  Provided Verbal Education On Other  [Caregiver resources]        Follow up plan: No further intervention required. The patient is encouraged to contact SW if she would like resource education for [facility] needs.    Encounter Outcome:  Pt. Visit Completed   Bevelyn Ngo, BSW, CDP Social Worker, Certified Dementia Practitioner [facility] Care Management  Care Coordination [phone]

## 2023-06-06 NOTE — Patient Instructions (Signed)
 Visit Information  Thank you for taking time to visit with me today. Please don't hesitate to contact me if I can be of assistance to you.    If you are experiencing a Mental Health or Behavioral Health Crisis or need someone to talk to, please call 1-800-273-TALK (toll free, 24 hour hotline) go to [facility] [address] [place] [phone]) call 911  The patient verbalized understanding of instructions, educational materials, and care plan provided today and DECLINED offer to receive copy of patient instructions, educational materials, and care plan.   No further follow up required: Please contact me as needed.  Bevelyn Ngo, BSW, CDP Social Worker, Certified Dementia Practitioner [facility] Care Management  Care Coordination [phone]

## 2023-06-10 ENCOUNTER — Ambulatory Visit
Admission: RE | Admit: 2023-06-10 | Discharge: 2023-06-10 | Disposition: A | Discharge status: Home / Self Care | Payer: Medicare HMO | Source: Ambulatory Visit | Attending: Nurse Practitioner | Admitting: Nurse Practitioner

## 2023-06-10 DIAGNOSIS — Z1231 Encounter for screening mammogram for malignant neoplasm of breast: Secondary | ICD-10-CM | POA: Diagnosis not present

## 2023-06-13 ENCOUNTER — Ambulatory Visit: Payer: Medicare HMO | Admitting: Podiatry

## 2023-06-13 DIAGNOSIS — B353 Tinea pedis: Secondary | ICD-10-CM | POA: Diagnosis not present

## 2023-06-13 DIAGNOSIS — E1165 Type 2 diabetes mellitus with hyperglycemia: Secondary | ICD-10-CM | POA: Diagnosis not present

## 2023-06-13 MED ORDER — KETOCONAZOLE 2 % EX CREA: 1.0000 | TOPICAL_CREAM | Freq: Every day | CUTANEOUS | 3 refills | Status: DC

## 2023-06-13 NOTE — Progress Notes (Signed)
  Subjective:  Patient ID: [name], female    DOB: 1966/10/03,  MRN: [id number]  Chief Complaint  Patient presents with   Peripheral Neuropathy    Patient complaining of tingling and pins and needle sensation. She is taking Gabapentin 600mg  as needed.    Tinea Pedis    Possible athletes foot. C/o skin peeling and itchiness.     57 y.o. female presents for diabetic foot exam.  Patient is able to trim her nails.  She was referred by her primary care doctor for foot exam.  She does report some peeling dry skin as well as itching in both feet.  Past Medical History:  Diagnosis Date   Allergy    Asthma    Bronchitis    Diabetes (HCC)    Diabetes mellitus without complication (HCC)    GERD (gastroesophageal reflux disease)    Hypertension    Obesity    Sleep apnea    uses c pap     Allergies  Allergen Reactions   Seasonal Ic [Octacosanol]     ROS: Negative except as per HPI above  Objective:  General: AAO x3, NAD  Dermatological: Bilateral foot though right hallux worse with direct CT scan metastatic healing flaking skin with mild red rash but consistent with tinea pedis infection.  Itching  Vascular:  Dorsalis Pedis artery and Posterior Tibial artery pedal pulses are 2/4 bilateral.  Capillary fill time < 3 sec to all digits.   Neruologic: Grossly intact via light touch bilateral. Protective threshold intact to all sites bilateral.   Musculoskeletal: No gross boney pedal deformities bilateral. No pain, crepitus, or limitation noted with foot and ankle range of motion bilateral. Muscular strength 5/5 in all groups tested bilateral.  Gait: Unassisted, Nonantalgic.   No images are attached to the encounter.  Radiographs:  Deferred Assessment:   1. Tinea pedis of both feet   2. Type 2 diabetes mellitus with hyperglycemia, without long-term current use of insulin (HCC)      Plan:  Patient was evaluated and treated and all questions answered.  # Tinea  pedis bilateral Discussed the etiology and treatment options for tinea pedis.  Discussed topical and oral treatment.  Recommended topical treatment with 2% ketoconazole cream.  This was sent to the patient's pharmacy.  Also discussed appropriate foot hygiene, use of antifungal spray such as Tinactin in shoes, as well as cleaning her foot surfaces such as showers and bathroom floors with bleach.  #DM2 with peripheral neuropathy -Continue gabapentin use as needed for neuropathic pain patient does not have any evidence of sensory neuropathy with numbness however. Patient educated on diabetes. Discussed proper diabetic foot care and discussed risks and complications of disease. Educated patient in depth on reasons to return to the office immediately should he/she discover anything concerning or new on the feet. All questions answered. Discussed proper shoes as well.    Return in about 6 months (around 12/12/2023) for routine DM exam.          Corinna Gab, DPM [facility] / [facility]

## 2023-06-20 ENCOUNTER — Other Ambulatory Visit: Payer: Self-pay | Admitting: Nurse Practitioner

## 2023-06-20 DIAGNOSIS — E1165 Type 2 diabetes mellitus with hyperglycemia: Secondary | ICD-10-CM

## 2023-06-27 ENCOUNTER — Ambulatory Visit: Payer: Medicare HMO | Admitting: Internal Medicine

## 2023-07-02 DIAGNOSIS — G4733 Obstructive sleep apnea (adult) (pediatric): Secondary | ICD-10-CM | POA: Diagnosis not present

## 2023-07-18 ENCOUNTER — Ambulatory Visit (INDEPENDENT_AMBULATORY_CARE_PROVIDER_SITE_OTHER): Payer: Medicare HMO | Admitting: Nurse Practitioner

## 2023-07-18 ENCOUNTER — Ambulatory Visit: Payer: Self-pay | Admitting: Nurse Practitioner

## 2023-07-18 ENCOUNTER — Encounter: Payer: Self-pay | Admitting: Nurse Practitioner

## 2023-07-18 VITALS — BP 116/53 | HR 54 | Temp 97.1°F | Wt 261.6 lb

## 2023-07-18 DIAGNOSIS — E785 Hyperlipidemia, unspecified: Secondary | ICD-10-CM

## 2023-07-18 DIAGNOSIS — E1165 Type 2 diabetes mellitus with hyperglycemia: Secondary | ICD-10-CM

## 2023-07-18 DIAGNOSIS — Z23 Encounter for immunization: Secondary | ICD-10-CM

## 2023-07-18 DIAGNOSIS — I10 Essential (primary) hypertension: Secondary | ICD-10-CM | POA: Diagnosis not present

## 2023-07-18 DIAGNOSIS — J302 Other seasonal allergic rhinitis: Secondary | ICD-10-CM

## 2023-07-18 DIAGNOSIS — E119 Type 2 diabetes mellitus without complications: Secondary | ICD-10-CM | POA: Insufficient documentation

## 2023-07-18 LAB — POCT GLYCOSYLATED HEMOGLOBIN (HGB A1C): Hemoglobin A1C: 6 % — AB (ref 4.0–5.6)

## 2023-07-18 MED ORDER — ONETOUCH DELICA LANCETS 33G MISC
1.0000 | Freq: Two times a day (BID) | 4 refills | Status: DC
Start: 2023-07-18 — End: 2024-02-10

## 2023-07-18 MED ORDER — FLUTICASONE PROPIONATE 50 MCG/ACT NA SUSP: 2.0000 | Freq: Every day | NASAL | 6 refills | Status: DC

## 2023-07-18 MED ORDER — ATENOLOL 50 MG PO TABS
50.0000 mg | ORAL_TABLET | Freq: Every day | ORAL | 3 refills | Status: DC
Start: 2023-07-18 — End: 2023-08-15

## 2023-07-18 NOTE — Assessment & Plan Note (Signed)
BP Readings from Last 3 Encounters:  07/18/23 (!) 116/53  04/29/23 (!) 120/50  03/18/23 130/80  Due to complaints of hypertension will atenolol to 50 mg daily, continue hydrochlorothiazide 12.5 mg daily Monitor blood pressure at home, BP goal is less than 130/80.   Report blood pressure readings consistently greater than 130/80 Discussed DASH diet and dietary sodium restrictions Continue to increase dietary efforts and exercise.  Follow-up in 4 weeks

## 2023-07-18 NOTE — Assessment & Plan Note (Signed)
   Take  fluticasone (FLONASE) 50 MCG/ACT nasal spray; Place 2 sprays into both nostrils daily.  Dispense: 16 g; Refill: 6

## 2023-07-18 NOTE — Progress Notes (Signed)
Established Patient Office Visit  Subjective:  Patient ID: [name], female    DOB: 13-Apr-1966  Age: 57 y.o. MRN: [id number]  CC:  Chief Complaint  Patient presents with   Diabetes   Medical Management of Chronic Issues    HPI [name] is a 57 y.o. female  has a past medical history of Allergy, Asthma, Bronchitis, Class 3 severe obesity due to excess calories with serious comorbidity and body mass index (BMI) of 45.0 to 49.9 in adult [facility]) (12/15/2013), Diabetes mellitus without complication (HCC), Dyslipidemia, goal LDL below 70 (04/29/2023), GERD (gastroesophageal reflux disease), Hypertension, Lumbar degenerative disc disease (06/04/2017), Obesity, Sleep apnea, and Vitamin D deficiency (06/30/2017).  Patient presents for follow up for her chronic medical conditions  Type 2 diabetes .  Currently on metformin 1000 mg daily she has been following a low-carb modified diet Patient denies hypoglycemia, polyphagia, polyuria polydipsia.  Not currently on a statin.  She did attend the diabetes nutrition education class, stated that the class was very helpful.  She picked up the prescriptions for Ozempic but never took the medication.  Hypertension.  Currently on atenolol 100 mg daily, hydrochlorothiazide 12.5 mg daily.  Stated that she was started on atenolol when she was on phentermine.  Blood pressure readings at home is sometimes low.  No  chest pain, shortness of breath, edema, dizziness  She has received the shingles vaccine at the pharmacy will get records from her pharmacy.   Past Medical History:  Diagnosis Date   Allergy    Asthma    Bronchitis    Class 3 severe obesity due to excess calories with serious comorbidity and body mass index (BMI) of 45.0 to 49.9 in adult [facility]) 12/15/2013   Diabetes mellitus without complication (HCC)    Dyslipidemia, goal LDL below 70 04/29/2023   GERD (gastroesophageal reflux disease)    Hypertension    Lumbar degenerative  disc disease 06/04/2017   Obesity    Sleep apnea    uses c pap    Vitamin D deficiency 06/30/2017    Past Surgical History:  Procedure Laterality Date   DENTAL SURGERY     ECTOPIC PREGNANCY SURGERY  2006    Family History  Problem Relation Age of Onset   Hypertension Father    Asthma Father    Hypertension Sister    Hypertension Sister    Stroke Paternal Grandfather    Hypertension Paternal Grandfather    Colon cancer Neg Hx    Colon polyps Neg Hx    Esophageal cancer Neg Hx    Rectal cancer Neg Hx    Stomach cancer Neg Hx    Breast cancer Neg Hx     Social History   Socioeconomic History   Marital status: Married    Spouse name: Not on file   Number of children: 2   Years of education: Not on file   Highest education level: Not on file  Occupational History   Not on file  Tobacco Use   Smoking status: Never   Smokeless tobacco: Never  Vaping Use   Vaping status: Never Used  Substance and Sexual Activity   Alcohol use: Not Currently    Comment: rare   Drug use: No   Sexual activity: Yes    Birth control/protection: Post-menopausal    Comment: >5 sexual partners  Other Topics Concern   Not on file  Social History Narrative   Lives with her husband   Social Determinants of Health  Financial Resource Strain: Medium Risk (05/29/2023)   Overall Financial Resource Strain (CARDIA)    Difficulty of Paying Living Expenses: Somewhat hard  Food Insecurity: Food Insecurity Present (06/06/2023)   Hunger Vital Sign    Worried About Running Out of Food in the Last Year: Often true    Ran Out of Food in the Last Year: Sometimes true  Transportation Needs: No Transportation Needs (05/29/2023)   PRAPARE - Administrator, Civil Service (Medical): No    Lack of Transportation (Non-Medical): No  Physical Activity: Insufficiently Active (05/29/2023)   Exercise Vital Sign    Days of Exercise per Week: 7 days    Minutes of Exercise per Session: 20 min  Stress:  No Stress Concern Present (05/29/2023)   [employer] of Occupational Health - Occupational Stress Questionnaire    Feeling of Stress : Not at all  Social Connections: Moderately Integrated (05/29/2023)   Social Connection and Isolation Panel [NHANES]    Frequency of Communication with Friends and Family: More than three times a week    Frequency of Social Gatherings with Friends and Family: More than three times a week    Attends Religious Services: More than 4 times per year    Active Member of [employer] or Organizations: No    Attends Banker Meetings: Never    Marital Status: Married  Catering manager Violence: Not At Risk (05/29/2023)   Humiliation, Afraid, Rape, and Kick questionnaire    Fear of Current or Ex-Partner: No    Emotionally Abused: No    Physically Abused: No    Sexually Abused: No    Outpatient Medications Prior to Visit  Medication Sig Dispense Refill   albuterol (PROVENTIL) (2.5 MG/3ML) 0.083% nebulizer solution Take 3 mLs (2.5 mg total) by nebulization every 6 (six) hours as needed for wheezing. 75 mL 12   albuterol (VENTOLIN HFA) 108 (90 Base) MCG/ACT inhaler INHALE 2 PUFFS BY MOUTH EVERY 4 HOURS AS NEEDED FOR WHEEZE OR FOR SHORTNESS OF BREATH 36 each 3   albuterol (VENTOLIN HFA) 108 (90 Base) MCG/ACT inhaler Ventolin HFA     budesonide-formoterol (SYMBICORT) 160-4.5 MCG/ACT inhaler Inhale 2 puffs into the lungs 2 (two) times daily. 10.2 each 6   clobetasol ointment (TEMOVATE) 0.05 % Apply 1 Application topically 2 (two) times daily. Apply a small amount topically twice daily for up to 2 weeks 60 g 0   cyclobenzaprine (FLEXERIL) 10 MG tablet Take 1 tablet (10 mg total) by mouth 2 (two) times daily as needed for muscle spasms. 20 tablet 0   gabapentin (NEURONTIN) 600 MG tablet Take 600 mg by mouth at bedtime.     glucose blood (ONETOUCH VERIO) test strip Use as instructed to check blood sugars twice daily and once additional if elevated. 300 strip 1    hydrochlorothiazide (HYDRODIURIL) 12.5 MG tablet TAKE 1 TABLET (12.5 MG TOTAL) DAILY BY MOUTH. 30 tablet 3   HYDROcodone-acetaminophen (NORCO/VICODIN) 5-325 MG tablet Take 1 tablet by mouth 2 (two) times daily as needed for moderate pain.     ketoconazole (NIZORAL) 2 % cream Apply 1 Application topically daily. 60 g 3   Magnesium 100 MG CAPS Take by mouth.     meloxicam (MOBIC) 7.5 MG tablet meloxicam     metFORMIN (GLUCOPHAGE) 500 MG tablet Take 1 tablet (500 mg total) by mouth daily with breakfast. 30 tablet 0   montelukast (SINGULAIR) 10 MG tablet TAKE 1 TABLET BY MOUTH EVERYDAY AT BEDTIME 30 tablet 3  Nebulizers (COMPRESSOR/NEBULIZER) MISC As directed 1 each 0   atenolol (TENORMIN) 100 MG tablet Take 100 mg by mouth daily.     furosemide (LASIX) 20 MG tablet Take 1 tablet (20 mg total) by mouth 2 (two) times daily as needed for up to 3 days. (Patient not taking: Reported on 07/18/2023) 6 tablet 0   ibuprofen (ADVIL,MOTRIN) 800 MG tablet Take 1 tablet (800 mg total) by mouth every 8 (eight) hours as needed. (Patient not taking: Reported on 07/18/2023) 30 tablet 2   Semaglutide,0.25 or 0.5MG /DOS, (OZEMPIC, 0.25 OR 0.5 MG/DOSE,) 2 MG/3ML SOPN INJECT 0.25 MG SUBCUTANEOUSLY ONE TIME PER WEEK (Patient not taking: Reported on 07/18/2023) 3 mL 0   No facility-administered medications prior to visit.    Allergies  Allergen Reactions   Seasonal Ic [Octacosanol]     ROS Review of Systems  Constitutional:  Negative for appetite change, chills, fatigue and fever.  HENT:  Negative for congestion, dental problem, drooling and ear discharge.   Respiratory:  Negative for cough, shortness of breath and wheezing.   Cardiovascular:  Negative for chest pain, palpitations and leg swelling.  Gastrointestinal:  Negative for abdominal pain, constipation, nausea and vomiting.  Genitourinary:  Negative for difficulty urinating, dysuria, flank pain and frequency.  Musculoskeletal:  Negative for arthralgias, back  pain, joint swelling and myalgias.  Skin:  Negative for color change, pallor, rash and wound.  Neurological:  Negative for dizziness, facial asymmetry, weakness, numbness and headaches.  Psychiatric/Behavioral:  Negative for behavioral problems, confusion, self-injury and suicidal ideas.       Objective:    Physical Exam Vitals and nursing note reviewed.  Constitutional:      General: She is not in acute distress.    Appearance: Normal appearance. She is not ill-appearing, toxic-appearing or diaphoretic.  HENT:     Nose: Congestion present.     Mouth/Throat:     Mouth: Mucous membranes are moist.     Pharynx: Oropharynx is clear. No oropharyngeal exudate or posterior oropharyngeal erythema.  Eyes:     General: No scleral icterus.       Right eye: No discharge.        Left eye: No discharge.     Extraocular Movements: Extraocular movements intact.     Conjunctiva/sclera: Conjunctivae normal.  Cardiovascular:     Rate and Rhythm: Normal rate and regular rhythm.     Pulses: Normal pulses.     Heart sounds: Normal heart sounds. No murmur heard.    No friction rub. No gallop.  Pulmonary:     Effort: Pulmonary effort is normal. No respiratory distress.     Breath sounds: Normal breath sounds. No stridor. No wheezing, rhonchi or rales.  Chest:     Chest wall: No tenderness.  Abdominal:     General: There is no distension.     Palpations: Abdomen is soft.     Tenderness: There is no abdominal tenderness. There is no right CVA tenderness, left CVA tenderness or guarding.  Musculoskeletal:        General: No swelling, tenderness, deformity or signs of injury.     Right lower leg: No edema.     Left lower leg: No edema.  Skin:    General: Skin is warm and dry.     Capillary Refill: Capillary refill takes less than 2 seconds.     Coloration: Skin is not jaundiced or pale.     Findings: No bruising, erythema or lesion.  Neurological:  Mental Status: She is alert and oriented to  person, place, and time.     Motor: No weakness.     Coordination: Coordination normal.     Gait: Gait normal.  Psychiatric:        Mood and Affect: Mood normal.        Behavior: Behavior normal.        Thought Content: Thought content normal.        Judgment: Judgment normal.     BP (!) 116/53   Pulse (!) 54   Temp (!) 97.1 F (36.2 C)   Wt 261 lb 9.6 oz (118.7 kg)   LMP  (LMP Unknown)   SpO2 95%   BMI 43.53 kg/m  Wt Readings from Last 3 Encounters:  07/18/23 261 lb 9.6 oz (118.7 kg)  05/29/23 267 lb (121.1 kg)  05/20/23 272 lb 6.4 oz (123.6 kg)    Lab Results  Component Value Date   TSH 1.42 01/31/2023   Lab Results  Component Value Date   WBC 5.9 01/31/2023   HGB 14.9 01/31/2023   HCT 45 01/31/2023   MCV 94.3 01/28/2023   PLT 254 01/31/2023   Lab Results  Component Value Date   NA 134 (A) 01/31/2023   K 4.0 01/28/2023   CO2 27 01/28/2023   GLUCOSE 397 (H) 01/28/2023   BUN 9 01/28/2023   CREATININE 0.7 01/31/2023   BILITOT 0.4 06/30/2017   ALKPHOS 78 06/30/2017   AST 38 (A) 01/31/2023   ALT 31 01/31/2023   PROT 7.3 06/30/2017   ALBUMIN 4.5 01/31/2023   CALCIUM 9.7 01/28/2023   ANIONGAP 14 01/28/2023   EGFR 98.0 01/31/2023   GFR 116.24 09/24/2017   Lab Results  Component Value Date   CHOL 128 05/01/2023   Lab Results  Component Value Date   HDL 53 05/01/2023   Lab Results  Component Value Date   LDLCALC 61 05/01/2023   Lab Results  Component Value Date   TRIG 69 05/01/2023   Lab Results  Component Value Date   CHOLHDL 2.4 05/01/2023   Lab Results  Component Value Date   HGBA1C 6.0 (A) 07/18/2023      Assessment & Plan:   Problem List Items Addressed This Visit       Cardiovascular and Mediastinum   HTN (hypertension)    BP Readings from Last 3 Encounters:  07/18/23 (!) 116/53  04/29/23 (!) 120/50  03/18/23 130/80  Due to complaints of hypertension will atenolol to 50 mg daily, continue hydrochlorothiazide 12.5 mg  daily Monitor blood pressure at home, BP goal is less than 130/80.   Report blood pressure readings consistently greater than 130/80 Discussed DASH diet and dietary sodium restrictions Continue to increase dietary efforts and exercise.  Follow-up in 4 weeks      Relevant Medications   atenolol (TENORMIN) 50 MG tablet   Other Relevant Orders   CMP14+EGFR     Respiratory   Seasonal allergic rhinitis      Take  fluticasone (FLONASE) 50 MCG/ACT nasal spray; Place 2 sprays into both nostrils daily.  Dispense: 16 g; Refill: 6       Relevant Medications   fluticasone (FLONASE) 50 MCG/ACT nasal spray     Endocrine   Type 2 diabetes mellitus with hyperglycemia, without long-term current use of insulin (HCC)    Lab Results  Component Value Date   HGBA1C 6.0 (A) 07/18/2023  Benefits of Ozempic discussed at length Continue metformin 1000 mg daily, encouraged to  decrease metformin to 500 mg daily if she would like to start Ozempic and follow-up in 4 weeks Continue low-carb modified diet Engage in regular moderate exercises at least 150 minutes weekly as tolerated Will obtain records of eye exam from the ophthalmologist office Follow-up in 4 weeks if she decides to start Ozempic      Relevant Medications   OneTouch Delica Lancets 33G MISC   Other Relevant Orders   Ambulatory referral to Ophthalmology     Other   Dyslipidemia, goal LDL below 70 - Primary    Lab Results  Component Value Date   CHOL 128 05/01/2023   HDL 53 05/01/2023   LDLCALC 61 05/01/2023   TRIG 69 05/01/2023   CHOLHDL 2.4 05/01/2023  Currently not on a statin Check lipid panel if LDL is greater then 70 will restart statin       Relevant Medications   atenolol (TENORMIN) 50 MG tablet   Other Relevant Orders   POCT glycosylated hemoglobin (Hb A1C) (Completed)   Lipid panel   Need for influenza vaccination    Patient educated on CDC recommendation for the vaccine. Verbal consent was obtained from the  patient, vaccine administered by nurse, no sign of adverse reactions noted at this time. Patient education on arm soreness and use of tylenol  for this patient  was discussed. Patient educated on the signs and symptoms of adverse effect and advise to contact the office if they occur. Vaccine information sheet given to patient.        Relevant Orders   Flu vaccine trivalent PF, 6mos and older(Flulaval,Afluria,Fluarix,Fluzone) (Completed)    Meds ordered this encounter  Medications   fluticasone (FLONASE) 50 MCG/ACT nasal spray    Sig: Place 2 sprays into both nostrils daily.    Dispense:  16 g    Refill:  6   atenolol (TENORMIN) 50 MG tablet    Sig: Take 1 tablet (50 mg total) by mouth daily.    Dispense:  90 tablet    Refill:  3   OneTouch Delica Lancets 33G MISC    Sig: 1 each by Does not apply route in the morning and at bedtime.    Dispense:  200 each    Refill:  4    Follow-up: Return in about 4 weeks (around 08/15/2023) for HTN, DM.    Donell Beers, FNP

## 2023-07-18 NOTE — Assessment & Plan Note (Signed)
Lab Results  Component Value Date   HGBA1C 6.0 (A) 07/18/2023  Benefits of Ozempic discussed at length Continue metformin 1000 mg daily, encouraged to decrease metformin to 500 mg daily if she would like to start Ozempic and follow-up in 4 weeks Continue low-carb modified diet Engage in regular moderate exercises at least 150 minutes weekly as tolerated Will obtain records of eye exam from the ophthalmologist office Follow-up in 4 weeks if she decides to start Ozempic

## 2023-07-18 NOTE — Assessment & Plan Note (Signed)
Lab Results  Component Value Date   CHOL 128 05/01/2023   HDL 53 05/01/2023   LDLCALC 61 05/01/2023   TRIG 69 05/01/2023   CHOLHDL 2.4 05/01/2023  Currently not on a statin Check lipid panel if LDL is greater then 70 will restart statin

## 2023-07-18 NOTE — Patient Instructions (Signed)
Your blood pressure goal is less than 130/80 Please start taking atenolol 50 mg daily, continue your current dose of hydrochlorothiazide 12.5 mg daily  Around 3 times per week, check your blood pressure 2 times per day. once in the morning and once in the evening. The readings should be at least one minute apart. Write down these values and bring them to your next nurse visit/appointment.  When you check your BP, make sure you have been doing something calm/relaxing 5 minutes prior to checking. Both feet should be flat on the floor and you should be sitting. Use your left arm and make sure it is in a relaxed position (on a table), and that the cuff is at the approximate level/height of your heart.   If you decide to take Ozempic 0.25 mg once at an injection, please metformin to 500 mg daily Goal for fasting blood sugar ranges from 80 to 120 and 2 hours after any meal or at bedtime should be between 130 to 170.    It is important that you exercise regularly at least 30 minutes 5 times a week as tolerated  Think about what you will eat, plan ahead. Choose " clean, green, fresh or frozen" over canned, processed or packaged foods which are more sugary, salty and fatty. 70 to 75% of food eaten should be vegetables and fruit. Three meals at set times with snacks allowed between meals, but they must be fruit or vegetables. Aim to eat over a 12 hour period , example 7 am to 7 pm, and STOP after  your last meal of the day. Drink water,generally about 64 ounces per day, no other drink is as healthy. Fruit juice is best enjoyed in a healthy way, by EATING the fruit.  Thanks for choosing [facility] we consider it a privelige to serve you.

## 2023-07-18 NOTE — Assessment & Plan Note (Addendum)
Patient educated on CDC recommendation for the vaccine. Verbal consent was obtained from the patient, vaccine administered by nurse, no sign of adverse reactions noted at this time. Patient education on arm soreness and use of tylenol  for this patient  was discussed. Patient educated on the signs and symptoms of adverse effect and advise to contact the office if they occur. Vaccine information sheet given to patient.  

## 2023-07-19 LAB — CMP14+EGFR
ALT: 15 [IU]/L (ref 0–32)
AST: 22 [IU]/L (ref 0–40)
Albumin: 4.2 g/dL (ref 3.8–4.9)
Alkaline Phosphatase: 62 [IU]/L (ref 44–121)
BUN/Creatinine Ratio: 17 (ref 9–23)
BUN: 13 mg/dL (ref 6–24)
Bilirubin Total: 0.2 mg/dL (ref 0.0–1.2)
CO2: 27 mmol/L (ref 20–29)
Calcium: 9.8 mg/dL (ref 8.7–10.2)
Chloride: 102 mmol/L (ref 96–106)
Creatinine, Ser: 0.76 mg/dL (ref 0.57–1.00)
Globulin, Total: 2.7 g/dL (ref 1.5–4.5)
Glucose: 92 mg/dL (ref 70–99)
Potassium: 4.2 mmol/L (ref 3.5–5.2)
Sodium: 143 mmol/L (ref 134–144)
Total Protein: 6.9 g/dL (ref 6.0–8.5)
eGFR: 90 mL/min/{1.73_m2} (ref >59)

## 2023-07-19 LAB — LIPID PANEL
Chol/HDL Ratio: 3.5 {ratio} (ref 0.0–4.4)
Cholesterol, Total: 216 mg/dL — ABNORMAL HIGH (ref 100–199)
HDL: 61 mg/dL (ref >39)
LDL Chol Calc (NIH): 136 mg/dL — ABNORMAL HIGH (ref 0–99)
Triglycerides: 106 mg/dL (ref 0–149)
VLDL Cholesterol Cal: 19 mg/dL (ref 5–40)

## 2023-07-21 ENCOUNTER — Other Ambulatory Visit: Payer: Self-pay | Admitting: Nurse Practitioner

## 2023-07-21 DIAGNOSIS — E785 Hyperlipidemia, unspecified: Secondary | ICD-10-CM

## 2023-07-21 DIAGNOSIS — Z789 Other specified health status: Secondary | ICD-10-CM | POA: Insufficient documentation

## 2023-07-21 MED ORDER — EZETIMIBE 10 MG PO TABS
10.0000 mg | ORAL_TABLET | Freq: Every day | ORAL | 0 refills | Status: DC
Start: 2023-07-21 — End: 2023-10-16

## 2023-07-21 MED ORDER — ROSUVASTATIN CALCIUM 10 MG PO TABS
10.0000 mg | ORAL_TABLET | Freq: Every day | ORAL | 11 refills | Status: DC
Start: 2023-07-21 — End: 2023-07-21

## 2023-08-09 ENCOUNTER — Other Ambulatory Visit: Payer: Self-pay

## 2023-08-09 ENCOUNTER — Emergency Department (HOSPITAL_COMMUNITY): Payer: Medicare HMO

## 2023-08-09 ENCOUNTER — Encounter (HOSPITAL_COMMUNITY): Payer: Self-pay

## 2023-08-09 ENCOUNTER — Emergency Department (HOSPITAL_COMMUNITY)
Admission: EM | Admit: 2023-08-09 | Discharge: 2023-08-09 | Disposition: A | Discharge status: Home / Self Care | Payer: Medicare HMO | Attending: Emergency Medicine | Admitting: Emergency Medicine

## 2023-08-09 DIAGNOSIS — Z7984 Long term (current) use of oral hypoglycemic drugs: Secondary | ICD-10-CM | POA: Insufficient documentation

## 2023-08-09 DIAGNOSIS — R0789 Other chest pain: Secondary | ICD-10-CM | POA: Insufficient documentation

## 2023-08-09 DIAGNOSIS — I517 Cardiomegaly: Secondary | ICD-10-CM | POA: Diagnosis not present

## 2023-08-09 DIAGNOSIS — Y92410 Unspecified street and highway as the place of occurrence of the external cause: Secondary | ICD-10-CM | POA: Insufficient documentation

## 2023-08-09 DIAGNOSIS — S39012A Strain of muscle, fascia and tendon of lower back, initial encounter: Secondary | ICD-10-CM | POA: Insufficient documentation

## 2023-08-09 DIAGNOSIS — M5136 Other intervertebral disc degeneration, lumbar region with discogenic back pain only: Secondary | ICD-10-CM | POA: Diagnosis not present

## 2023-08-09 DIAGNOSIS — M25551 Pain in right hip: Secondary | ICD-10-CM | POA: Diagnosis not present

## 2023-08-09 DIAGNOSIS — M47816 Spondylosis without myelopathy or radiculopathy, lumbar region: Secondary | ICD-10-CM | POA: Diagnosis not present

## 2023-08-09 DIAGNOSIS — S79911A Unspecified injury of right hip, initial encounter: Secondary | ICD-10-CM | POA: Diagnosis not present

## 2023-08-09 DIAGNOSIS — R0989 Other specified symptoms and signs involving the circulatory and respiratory systems: Secondary | ICD-10-CM | POA: Diagnosis not present

## 2023-08-09 DIAGNOSIS — Z79899 Other long term (current) drug therapy: Secondary | ICD-10-CM | POA: Insufficient documentation

## 2023-08-09 DIAGNOSIS — S299XXA Unspecified injury of thorax, initial encounter: Secondary | ICD-10-CM | POA: Diagnosis not present

## 2023-08-09 DIAGNOSIS — I10 Essential (primary) hypertension: Secondary | ICD-10-CM | POA: Diagnosis not present

## 2023-08-09 DIAGNOSIS — E119 Type 2 diabetes mellitus without complications: Secondary | ICD-10-CM | POA: Insufficient documentation

## 2023-08-09 DIAGNOSIS — S3992XA Unspecified injury of lower back, initial encounter: Secondary | ICD-10-CM | POA: Diagnosis present

## 2023-08-09 MED ORDER — IPRATROPIUM-ALBUTEROL 0.5-2.5 (3) MG/3ML IN SOLN
3.0000 mL | Freq: Once | RESPIRATORY_TRACT | Status: AC
  Administered 2023-08-09: 3 mL via RESPIRATORY_TRACT
  Filled 2023-08-09: qty 3

## 2023-08-09 MED ORDER — METHOCARBAMOL 500 MG PO TABS: 500.0000 mg | ORAL_TABLET | Freq: Two times a day (BID) | ORAL | 0 refills | Status: AC

## 2023-08-09 MED ORDER — ACETAMINOPHEN 325 MG PO TABS
650.0000 mg | ORAL_TABLET | Freq: Once | ORAL | Status: AC
Start: 2023-08-09 — End: 2023-08-09
  Administered 2023-08-09: 650 mg via ORAL
  Filled 2023-08-09: qty 2
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
## 2023-08-09 NOTE — Discharge Instructions (Signed)
Take over-the-counter medications as needed for pain.  I have also prescribed a muscle relaxant Robaxin.  Expect to be stiff and sore for the next several days.  Follow-up with your doctor to be rechecked if you are not improving in the next week
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
## 2023-08-10 DIAGNOSIS — M545 Low back pain, unspecified: Secondary | ICD-10-CM | POA: Diagnosis not present

## 2023-08-10 DIAGNOSIS — R000 Tachycardia, unspecified: Secondary | ICD-10-CM | POA: Diagnosis not present

## 2023-08-10 DIAGNOSIS — Z743 Need for continuous supervision: Secondary | ICD-10-CM | POA: Diagnosis not present

## 2023-08-10 DIAGNOSIS — R202 Paresthesia of skin: Secondary | ICD-10-CM | POA: Diagnosis not present

## 2023-08-15 ENCOUNTER — Ambulatory Visit (INDEPENDENT_AMBULATORY_CARE_PROVIDER_SITE_OTHER): Payer: Medicare HMO | Admitting: Nurse Practitioner

## 2023-08-15 ENCOUNTER — Encounter: Payer: Self-pay | Admitting: Nurse Practitioner

## 2023-08-15 ENCOUNTER — Other Ambulatory Visit: Payer: Self-pay | Admitting: Nurse Practitioner

## 2023-08-15 VITALS — BP 130/55 | HR 65 | Temp 97.0°F | Wt 254.0 lb

## 2023-08-15 DIAGNOSIS — E785 Hyperlipidemia, unspecified: Secondary | ICD-10-CM

## 2023-08-15 DIAGNOSIS — H579 Unspecified disorder of eye and adnexa: Secondary | ICD-10-CM | POA: Diagnosis not present

## 2023-08-15 DIAGNOSIS — I10 Essential (primary) hypertension: Secondary | ICD-10-CM | POA: Diagnosis not present

## 2023-08-15 DIAGNOSIS — E1165 Type 2 diabetes mellitus with hyperglycemia: Secondary | ICD-10-CM

## 2023-08-15 DIAGNOSIS — M51369 Other intervertebral disc degeneration, lumbar region without mention of lumbar back pain or lower extremity pain: Secondary | ICD-10-CM | POA: Diagnosis not present

## 2023-08-15 MED ORDER — IBUPROFEN 800 MG PO TABS: 800.0000 mg | ORAL_TABLET | Freq: Three times a day (TID) | ORAL | 1 refills | Status: DC | PRN

## 2023-08-15 MED ORDER — METFORMIN HCL 500 MG PO TABS: 1000.0000 mg | ORAL_TABLET | Freq: Every day | ORAL | 3 refills | Status: DC

## 2023-08-15 MED ORDER — ATENOLOL 50 MG PO TABS: 25.0000 mg | ORAL_TABLET | Freq: Every day | ORAL | 0 refills | Status: DC

## 2023-08-15 MED ORDER — OLOPATADINE HCL 0.1 % OP SOLN: 1.0000 [drp] | Freq: Two times a day (BID) | OPHTHALMIC | 2 refills | Status: DC

## 2023-08-15 MED ORDER — KETOROLAC TROMETHAMINE 60 MG/2ML IM SOLN
60.0000 mg | Freq: Once | INTRAMUSCULAR | Status: AC
  Administered 2023-08-15: 60 mg via INTRAMUSCULAR
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
## 2023-08-15 NOTE — Patient Instructions (Signed)
Blood pressure goal is less than 130/80.  Please decrease atenolol to 25 mg daily Monitor your blood pressure at home and reports blood pressure reading consistently greater than 130/80   Around 3 times per week, check your blood pressure 2 times per day. once in the morning and once in the evening. The readings should be at least one minute apart. Write down these values and bring them to your next nurse visit/appointment.  When you check your BP, make sure you have been doing something calm/relaxing 5 minutes prior to checking. Both feet should be flat on the floor and you should be sitting. Use your left arm and make sure it is in a relaxed position (on a table), and that the cuff is at the approximate level/height of your heart.       Type 2 diabetes mellitus with hyperglycemia, without long-term current use of insulin (HCC)  - metFORMIN (GLUCOPHAGE) 500 MG tablet; Take 2 tablets (1,000 mg total) by mouth daily with breakfast.  Dispense: 60 tablet; Refill: 3 - Lipid panel; Future   Degeneration of intervertebral disc of lumbar region, unspecified whether pain present  - ibuprofen (ADVIL) 800 MG tablet; Take 1 tablet (800 mg total) by mouth every 8 (eight) hours as needed.  Dispense: 30 tablet; Refill: 1  Hypertension, unspecified type  - atenolol (TENORMIN) 50 MG tablet; Take 0.5 tablets (25 mg total) by mouth daily.  Dispense: 90 tablet; Refill: 0

## 2023-08-15 NOTE — Addendum Note (Signed)
Addended by: Renelda Loma on: 08/15/2023 02:40 PM   Modules accepted: Orders

## 2023-08-15 NOTE — Assessment & Plan Note (Signed)
Has been using OTC allergy eyedrops, she would like a prescription Ordered olopatadine 0.1% eyedrops apply twice daily

## 2023-08-15 NOTE — Assessment & Plan Note (Signed)
Metformin 1000 mg daily refilled Not taking Ozempic, Ozempic discontinued
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
## 2023-08-15 NOTE — Assessment & Plan Note (Signed)
She has started taking Zetia 10 mg daily, since it does not build up to 4 weeks since she started medication we will recheck fasting lipid panel in 4 weeks I discussed adding Repatha injection if her LDL is not at goal after rechecking labs Avoid fatty fried foods

## 2023-08-15 NOTE — Assessment & Plan Note (Signed)
BP Readings from Last 3 Encounters:  08/15/23 (!) 130/55  08/09/23 126/61  07/18/23 (!) 116/53  Diastolic blood pressure is persistently low.  Will decrease atenolol to 25 mg daily continue hydrochlorothiazide 12.5 mg daily Blood pressure goal is less than 130/80.  Patient encouraged to call the office with blood pressure readings consistently greater than 130/80 Discussed DASH diet and dietary sodium restrictions Continue to increase dietary efforts and exercise.

## 2023-08-16 ENCOUNTER — Other Ambulatory Visit: Payer: Self-pay | Admitting: Nurse Practitioner

## 2023-08-16 DIAGNOSIS — E1165 Type 2 diabetes mellitus with hyperglycemia: Secondary | ICD-10-CM

## 2023-08-21 DIAGNOSIS — G4733 Obstructive sleep apnea (adult) (pediatric): Secondary | ICD-10-CM | POA: Diagnosis not present

## 2023-08-25 ENCOUNTER — Encounter: Payer: Medicare HMO | Attending: Nurse Practitioner | Admitting: Dietician

## 2023-08-25 ENCOUNTER — Encounter: Payer: Self-pay | Admitting: Dietician

## 2023-08-25 DIAGNOSIS — E119 Type 2 diabetes mellitus without complications: Secondary | ICD-10-CM | POA: Diagnosis not present

## 2023-08-25 NOTE — Progress Notes (Signed)
Appointment start:  1055 Appointment End:  1133   Patient attended Diabetes Core Classes 1-3 between 05/20/2023 and 06/03/2023 at [facility]. The purpose of the meeting today is to review information learned during those classes as well as review patient application and goals.   What are one or two positive things that you are doing right now to manage your diabetes?  Checking her blood glucose, balanced diet - avoids most sugar  What is the hardest part about your diabetes right now, causing you the most concern, or is the most worrisome to you about your diabetes?  Feels good about it now  What questions do you have today?  Sometimes I get scared when my blood glucose will get too low overnight - discussed that there are not risks for that currently on her current medications  Have you participated in any diabetes support group?  none  History:  Type 2 Diabetes, HLD, HTN, GERD, OSA- c-pap A1C:  6% 07/18/2023 decreased from 7.5% 04/29/2023, 14.2% 01/31/2023 Medications include:  Metformin XR Sleep:  good unless in pain Weight:  254 lbs 08/15/2023 decreased from 300 lbs prior to the diabetes diagnosis in 01/2023 Blood Glucose:  Fasting:  96  2 hours after starting a meal:  104  Have you had any low blood sugar readings in the past month?  none  Social History:  Patient lives with her husband.  She is on disability.  She does most of the cooking and share shopping.  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 Exercise:  walks with a cane. Husband works with her occasionally.  24 hour diet recall: Breakfast:  [employer] bread, [place], mozzarella cheese Snack:  occasional fruit Lunch:  white rice (usually brown), cabbage, 1 hamburger patty, cornbread Snack:  occasional salad with vinaigrette OR ritz crackers OR fritos corn chips Dinner:  cereal (cinnamon toast crunch), skim or 1% milk Snack:  none - rare fruit Beverages:  water, zero soda, sugar  free   Specific focus but not limited to the following: Review of blood glucose monitoring and interpretation including the recommended target ranges and Hgb A1c.  Review of carbohydrate counting, importance of regularly scheduled meals/snacks, and meal planning to improve quality of diet. Review of the effects of physical activity on glucose levels and long-term glucose control.  Recommended goal of 150 minutes of physical activity/week. Review of patient medications and discussed role of medication on blood glucose and possible side effects. Discussion of strategies to manage stress, psychosocial issues, and other obstacles to diabetes management. Review of short-term complications: hyper- and hypo-glycemia (causes, symptoms, and treatment options) Review of prevention, detection, and treatment of long-term complications.  Discussion of the role of prolonged elevated glucose levels on body systems.  Continuing Goals: Find ways to be more active.  Aim for 30 minutes most days.  Great job on changes made! Continue to make 1/2 your plate vegetables most often.  Future Follow up:  6 months

## 2023-08-25 NOTE — Patient Instructions (Addendum)
Find ways to be more active.  Aim for 30 minutes most days.  Great job on changes made! Continue to make 1/2 your plate vegetables most often.

## 2023-08-29 ENCOUNTER — Other Ambulatory Visit: Payer: Self-pay

## 2023-09-20 DIAGNOSIS — G4733 Obstructive sleep apnea (adult) (pediatric): Secondary | ICD-10-CM | POA: Diagnosis not present

## 2023-10-16 ENCOUNTER — Other Ambulatory Visit: Payer: Self-pay | Admitting: Nurse Practitioner

## 2023-10-16 DIAGNOSIS — E785 Hyperlipidemia, unspecified: Secondary | ICD-10-CM

## 2023-10-27 ENCOUNTER — Other Ambulatory Visit: Payer: Self-pay | Admitting: Nurse Practitioner

## 2023-10-27 MED ORDER — HYDROCHLOROTHIAZIDE 12.5 MG PO TABS: 12.5000 mg | ORAL_TABLET | Freq: Every day | ORAL | 1 refills | Status: DC

## 2023-11-04 ENCOUNTER — Ambulatory Visit
Admission: RE | Admit: 2023-11-04 | Discharge: 2023-11-04 | Disposition: A | Discharge status: Home / Self Care | Payer: Medicare Other | Source: Ambulatory Visit | Attending: Family Medicine | Admitting: Family Medicine

## 2023-11-04 VITALS — BP 141/77 | HR 102 | Temp 99.3°F | Resp 18

## 2023-11-04 DIAGNOSIS — R062 Wheezing: Secondary | ICD-10-CM

## 2023-11-04 DIAGNOSIS — J4541 Moderate persistent asthma with (acute) exacerbation: Secondary | ICD-10-CM

## 2023-11-04 DIAGNOSIS — J069 Acute upper respiratory infection, unspecified: Secondary | ICD-10-CM

## 2023-11-04 LAB — POC COVID19/FLU A&B COMBO
Covid Antigen, POC: NEGATIVE
Influenza A Antigen, POC: NEGATIVE
Influenza B Antigen, POC: NEGATIVE

## 2023-11-04 MED ORDER — HYDROCODONE BIT-HOMATROP MBR 5-1.5 MG/5ML PO SOLN: 5.0000 mL | Freq: Four times a day (QID) | ORAL | 0 refills | Status: DC | PRN

## 2023-11-04 MED ORDER — ALBUTEROL SULFATE (2.5 MG/3ML) 0.083% IN NEBU: 2.5000 mg | INHALATION_SOLUTION | Freq: Four times a day (QID) | RESPIRATORY_TRACT | 1 refills | Status: AC | PRN

## 2023-11-04 MED ORDER — ALBUTEROL SULFATE HFA 108 (90 BASE) MCG/ACT IN AERS: 1.0000 | INHALATION_SPRAY | Freq: Four times a day (QID) | RESPIRATORY_TRACT | 1 refills | Status: DC | PRN

## 2023-11-04 MED ORDER — PREDNISONE 20 MG PO TABS: 40.0000 mg | ORAL_TABLET | Freq: Every day | ORAL | 0 refills | Status: DC

## 2023-11-04 NOTE — ED Provider Notes (Signed)
[facility]   [id number] 11/04/23 Arrival Time: 1000  ASSESSMENT & PLAN:  1. Viral URI with cough   2. Moderate persistent asthma with acute exacerbation   3. Wheezing    Discussed typical duration of likely viral flu-like illness. Results for orders placed or performed during the hospital encounter of 11/04/23  POC Covid19/Flu A&B Antigen   Collection Time: 11/04/23 10:58 AM  Result Value Ref Range   Influenza A Antigen, POC Negative    Influenza B Antigen, POC Negative    Covid Antigen, POC Negative    OTC symptom care as needed.  Discharge Medication List as of 11/04/2023 11:31 AM     START taking these medications   Details  HYDROcodone bit-homatropine (HYCODAN) 5-1.5 MG/5ML syrup Take 5 mLs by mouth every 6 (six) hours as needed for cough., Starting Tue 11/04/2023, Normal    predniSONE (DELTASONE) 20 MG tablet Take 2 tablets (40 mg total) by mouth daily., Starting Tue 11/04/2023, Normal       Medication sedation precautions given.   Follow-up Information     Donell Beers, FNP.   Specialty: Nurse Practitioner Why: As needed. Contact information: [address] [place], [place] [address] [phone]         [facility]).   Specialty: Urgent Care Why: If worsening or failing to improve as anticipated. Contact information: [address] [place] [address] [phone]                Reviewed expectations re: course of current medical issues. Questions answered. Outlined signs and symptoms indicating need for more acute intervention. Understanding verbalized. After Visit Summary given.   SUBJECTIVE: History from: Patient. [name] is a 58 y.o. female. Patient presents with wheezing and coughing that started 3 days ago. Patient states she is using Albuterol treatments.  Denies CP/fever. Normal PO intake without  n/v.  OBJECTIVE:  Vitals:   11/04/23 1028 11/04/23 1030  BP: (!) 141/77   Pulse: (!) 102   Resp: 18   Temp: 99.3 F (37.4 C)   TempSrc: Oral   SpO2: 97% 95%    General appearance: alert; no distress Eyes: PERRLA; EOMI; conjunctiva normal HENT: [place]; AT; with mild nasal congestion Neck: supple  Lungs: speaks full sentences without difficulty; unlabored; bilat exp wheezing present Extremities: no edema Skin: warm and dry Neurologic: normal gait Psychological: alert and cooperative; normal mood and affect  Labs: Results for orders placed or performed during the hospital encounter of 11/04/23  POC Covid19/Flu A&B Antigen   Collection Time: 11/04/23 10:58 AM  Result Value Ref Range   Influenza A Antigen, POC Negative    Influenza B Antigen, POC Negative    Covid Antigen, POC Negative    Labs Reviewed  POC COVID19/FLU A&B COMBO - Normal    Imaging: No results found.  Allergies  Allergen Reactions   Seasonal Ic [Octacosanol]     Past Medical History:  Diagnosis Date   Allergy    Asthma    Bronchitis    Class 3 severe obesity due to excess calories with serious comorbidity and body mass index (BMI) of 45.0 to 49.9 in adult [facility]) 12/15/2013   Diabetes mellitus without complication (HCC)    Dyslipidemia, goal LDL below 70 04/29/2023   GERD (gastroesophageal reflux disease)    Hypertension    Lumbar degenerative disc disease 06/04/2017   Obesity    Sleep apnea  uses c pap    Vitamin D deficiency 06/30/2017   Social History   Socioeconomic History   Marital status: Married    Spouse name: Not on file   Number of children: 2   Years of education: Not on file   Highest education level: Not on file  Occupational History   Not on file  Tobacco Use   Smoking status: Never   Smokeless tobacco: Never  Vaping Use   Vaping status: Never Used  Substance and Sexual Activity   Alcohol use: Not Currently    Comment: rare   Drug use: No   Sexual activity: Yes     Birth control/protection: Post-menopausal    Comment: >5 sexual partners  Other Topics Concern   Not on file  Social History Narrative   Lives with her husband   Social Drivers of Health   Financial Resource Strain: Medium Risk (05/29/2023)   Overall Financial Resource Strain (CARDIA)    Difficulty of Paying Living Expenses: Somewhat hard  Food Insecurity: Food Insecurity Present (06/06/2023)   Hunger Vital Sign    Worried About Running Out of Food in the Last Year: Often true    Ran Out of Food in the Last Year: Sometimes true  Transportation Needs: No Transportation Needs (05/29/2023)   PRAPARE - Administrator, Civil Service (Medical): No    Lack of Transportation (Non-Medical): No  Physical Activity: Insufficiently Active (05/29/2023)   Exercise Vital Sign    Days of Exercise per Week: 7 days    Minutes of Exercise per Session: 20 min  Stress: No Stress Concern Present (05/29/2023)   [employer] of Occupational Health - Occupational Stress Questionnaire    Feeling of Stress : Not at all  Social Connections: Moderately Integrated (05/29/2023)   Social Connection and Isolation Panel [NHANES]    Frequency of Communication with Friends and Family: More than three times a week    Frequency of Social Gatherings with Friends and Family: More than three times a week    Attends Religious Services: More than 4 times per year    Active Member of [employer] or Organizations: No    Attends Banker Meetings: Never    Marital Status: Married  Catering manager Violence: Not At Risk (05/29/2023)   Humiliation, Afraid, Rape, and Kick questionnaire    Fear of Current or Ex-Partner: No    Emotionally Abused: No    Physically Abused: No    Sexually Abused: No   Family History  Problem Relation Age of Onset   Hypertension Father    Asthma Father    Hypertension Sister    Hypertension Sister    Stroke Paternal Grandfather    Hypertension Paternal Grandfather    Colon  cancer Neg Hx    Colon polyps Neg Hx    Esophageal cancer Neg Hx    Rectal cancer Neg Hx    Stomach cancer Neg Hx    Breast cancer Neg Hx    Past Surgical History:  Procedure Laterality Date   DENTAL SURGERY     ECTOPIC PREGNANCY SURGERY  2006     Mardella Layman, MD 11/04/23 1351

## 2023-11-04 NOTE — ED Triage Notes (Signed)
Patient presents with wheezing and coughing that started 3 days ago. Patient states she is using Albuterol treatments.

## 2023-11-04 NOTE — ED Triage Notes (Signed)
Patient denies any recent fever or vomiting.

## 2023-11-05 ENCOUNTER — Other Ambulatory Visit: Payer: Self-pay

## 2023-11-14 ENCOUNTER — Encounter: Payer: Self-pay | Admitting: Nurse Practitioner

## 2023-11-14 ENCOUNTER — Ambulatory Visit (INDEPENDENT_AMBULATORY_CARE_PROVIDER_SITE_OTHER): Payer: Medicare Other | Admitting: Nurse Practitioner

## 2023-11-14 VITALS — BP 128/57 | HR 67 | Temp 97.0°F | Wt 255.0 lb

## 2023-11-14 DIAGNOSIS — N644 Mastodynia: Secondary | ICD-10-CM

## 2023-11-14 DIAGNOSIS — I10 Essential (primary) hypertension: Secondary | ICD-10-CM

## 2023-11-14 DIAGNOSIS — E785 Hyperlipidemia, unspecified: Secondary | ICD-10-CM | POA: Diagnosis not present

## 2023-11-14 DIAGNOSIS — J069 Acute upper respiratory infection, unspecified: Secondary | ICD-10-CM | POA: Diagnosis not present

## 2023-11-14 DIAGNOSIS — E1165 Type 2 diabetes mellitus with hyperglycemia: Secondary | ICD-10-CM

## 2023-11-14 LAB — POCT GLYCOSYLATED HEMOGLOBIN (HGB A1C): Hemoglobin A1C: 6.2 % — AB (ref 4.0–5.6)

## 2023-11-14 MED ORDER — CEPHALEXIN 500 MG PO CAPS: 500.0000 mg | ORAL_CAPSULE | Freq: Four times a day (QID) | ORAL | 0 refills | Status: AC

## 2023-11-14 MED ORDER — HYDROCODONE BIT-HOMATROP MBR 5-1.5 MG/5ML PO SOLN: 5.0000 mL | Freq: Four times a day (QID) | ORAL | 0 refills | Status: DC | PRN

## 2023-11-14 NOTE — Assessment & Plan Note (Signed)
-   cephALEXin (KEFLEX) 500 MG capsule; Take 1 capsule (500 mg total) by mouth 4 (four) times daily for 10 days.  Dispense: 40 capsule; Refill: 0  If symptoms persist after full completion of antibiotics we will order a diagnostic mammogram.  Patient encouraged to call the office if her symptoms persist

## 2023-11-14 NOTE — Assessment & Plan Note (Signed)
Continue atenolol 25 mg daily, hydrochlorothiazide 12.5 mg daily DASH diet and commitment to daily physical activity for a minimum of 30 minutes discussed and encouraged, as a part of hypertension management.      11/14/2023   11:35 AM 11/04/2023   10:28 AM 08/15/2023   11:50 AM 08/15/2023   11:17 AM 08/09/2023    9:50 AM 08/09/2023    7:47 AM 08/09/2023    6:40 AM  BP/Weight  Systolic BP 128 141 130 114 126  126  Diastolic BP 57 77 55 53 61  57  Wt. (Lbs) 255   254  259   BMI 42.43 kg/m2   42.27 kg/m2  43.1 kg/m2

## 2023-11-14 NOTE — Assessment & Plan Note (Signed)
Chronic medical condition currently well-controlled with A1c of 6.2 Continue metformin 1000 mg daily Patient counseled on low-carb diet encouraged to engage in regular moderate exercises as tolerated at least [place] 50 minutes weekly Up-to-date with diabetic eye exam will obtain records from ophthalmologist

## 2023-11-14 NOTE — Assessment & Plan Note (Signed)
Needs refills of cough medicine  - HYDROcodone bit-homatropine (HYCODAN) 5-1.5 MG/5ML syrup; Take 5 mLs by mouth every 6 (six) hours as needed for cough.  Dispense: 90 mL; Refill: 0

## 2023-11-14 NOTE — Patient Instructions (Addendum)
Please consider getting Shingrix and pneumococcal vaccine at local pharmacy.   1. Type 2 diabetes mellitus with hyperglycemia, without long-term current use of insulin (HCC) (Primary)  - POCT glycosylated hemoglobin (Hb A1C) - Basic Metabolic Panel - Lipid panel - Vitamin B12  2. Dyslipidemia, goal LDL below 70   3. Hypertension, unspecified type  - Basic Metabolic Panel  4. Breast tenderness  - cephALEXin (KEFLEX) 500 MG capsule; Take 1 capsule (500 mg total) by mouth 4 (four) times daily for 10 days.  Dispense: 40 capsule; Refill: 0    It is important that you exercise regularly at least 30 minutes 5 times a week as tolerated  Think about what you will eat, plan ahead. Choose " clean, green, fresh or frozen" over canned, processed or packaged foods which are more sugary, salty and fatty. 70 to 75% of food eaten should be vegetables and fruit. Three meals at set times with snacks allowed between meals, but they must be fruit or vegetables. Aim to eat over a 12 hour period , example 7 am to 7 pm, and STOP after  your last meal of the day. Drink water,generally about 64 ounces per day, no other drink is as healthy. Fruit juice is best enjoyed in a healthy way, by EATING the fruit.  Thanks for choosing [facility] we consider it a privelige to serve you.

## 2023-11-14 NOTE — Progress Notes (Signed)
Established Patient Office Visit  Subjective:  Patient ID: [name], female    DOB: Sep 12, 1966  Age: 58 y.o. MRN: [id number]  CC:  Chief Complaint  Patient presents with   Hyperlipidemia    fasting    HPI [name] is a 58 y.o. female  has a past medical history of Allergy, Asthma, Bronchitis, Class 3 severe obesity due to excess calories with serious comorbidity and body mass index (BMI) of 45.0 to 49.9 in adult [facility]) (12/15/2013), Diabetes mellitus without complication (HCC), Dyslipidemia, goal LDL below 70 (04/29/2023), GERD (gastroesophageal reflux disease), Hypertension, Lumbar degenerative disc disease (06/04/2017), Obesity, Sleep apnea, and Vitamin D deficiency (06/30/2017).  Patient presents for follow-up for her chronic medical conditions Patient denies any adverse reactions to current medications She was at the urgent care on 11/04/2023 for viral URI with cough, states that she feels much better.  Has completed prednisone ordered and has been taking hydrocodone as needed  Type 2 diabetes.  Currently on metformin 1000 mg daily state that she has been working on her diet.  No complaints of polyuria polydipsia polyphagia.  Takes Zetia 10 mg daily for hyperlipidemia.  Hypertension.  Currently on atenolol 25 mg daily, hydrochlorothiazide 12.5 mg daily.  Denies chest pain dizziness.  Breast tenderness .patient complains of right breast tenderness and discharge from the nipple that started about a week ago.  No complaints of fever, chills.  She is up-to-date with mammogram, her last mammogram was normal   Due for pneumonia vaccine and shingles vaccine patient encouraged to get both vaccines at the pharmacy   Past Medical History:  Diagnosis Date   Allergy    Asthma    Bronchitis    Class 3 severe obesity due to excess calories with serious comorbidity and body mass index (BMI) of 45.0 to 49.9 in adult [facility]) 12/15/2013   Diabetes mellitus without  complication (HCC)    Dyslipidemia, goal LDL below 70 04/29/2023   GERD (gastroesophageal reflux disease)    Hypertension    Lumbar degenerative disc disease 06/04/2017   Obesity    Sleep apnea    uses c pap    Vitamin D deficiency 06/30/2017    Past Surgical History:  Procedure Laterality Date   DENTAL SURGERY     ECTOPIC PREGNANCY SURGERY  2006    Family History  Problem Relation Age of Onset   Hypertension Father    Asthma Father    Hypertension Sister    Hypertension Sister    Stroke Paternal Grandfather    Hypertension Paternal Grandfather    Colon cancer Neg Hx    Colon polyps Neg Hx    Esophageal cancer Neg Hx    Rectal cancer Neg Hx    Stomach cancer Neg Hx    Breast cancer Neg Hx     Social History   Socioeconomic History   Marital status: Married    Spouse name: Not on file   Number of children: 2   Years of education: Not on file   Highest education level: Not on file  Occupational History   Not on file  Tobacco Use   Smoking status: Never   Smokeless tobacco: Never  Vaping Use   Vaping status: Never Used  Substance and Sexual Activity   Alcohol use: Not Currently    Comment: rare   Drug use: No   Sexual activity: Yes    Birth control/protection: Post-menopausal    Comment: >5 sexual partners  Other Topics Concern  Not on file  Social History Narrative   Lives with her husband   Social Drivers of Health   Financial Resource Strain: Medium Risk (05/29/2023)   Overall Financial Resource Strain (CARDIA)    Difficulty of Paying Living Expenses: Somewhat hard  Food Insecurity: Food Insecurity Present (06/06/2023)   Hunger Vital Sign    Worried About Running Out of Food in the Last Year: Often true    Ran Out of Food in the Last Year: Sometimes true  Transportation Needs: No Transportation Needs (05/29/2023)   PRAPARE - Administrator, Civil Service (Medical): No    Lack of Transportation (Non-Medical): No  Physical Activity:  Insufficiently Active (05/29/2023)   Exercise Vital Sign    Days of Exercise per Week: 7 days    Minutes of Exercise per Session: 20 min  Stress: No Stress Concern Present (05/29/2023)   [employer] of Occupational Health - Occupational Stress Questionnaire    Feeling of Stress : Not at all  Social Connections: Moderately Integrated (05/29/2023)   Social Connection and Isolation Panel [NHANES]    Frequency of Communication with Friends and Family: More than three times a week    Frequency of Social Gatherings with Friends and Family: More than three times a week    Attends Religious Services: More than 4 times per year    Active Member of [employer] or Organizations: No    Attends Banker Meetings: Never    Marital Status: Married  Catering manager Violence: Not At Risk (05/29/2023)   Humiliation, Afraid, Rape, and Kick questionnaire    Fear of Current or Ex-Partner: No    Emotionally Abused: No    Physically Abused: No    Sexually Abused: No    Outpatient Medications Prior to Visit  Medication Sig Dispense Refill   albuterol (PROVENTIL) (2.5 MG/3ML) 0.083% nebulizer solution Take 3 mLs (2.5 mg total) by nebulization every 6 (six) hours as needed for wheezing. 75 mL 1   albuterol (VENTOLIN HFA) 108 (90 Base) MCG/ACT inhaler Inhale 1-2 puffs into the lungs every 6 (six) hours as needed for wheezing or shortness of breath. 1 each 1   atenolol (TENORMIN) 50 MG tablet Take 0.5 tablets (25 mg total) by mouth daily. 90 tablet 0   budesonide-formoterol (SYMBICORT) 160-4.5 MCG/ACT inhaler Inhale 2 puffs into the lungs 2 (two) times daily. 10.2 each 6   clobetasol ointment (TEMOVATE) 0.05 % Apply 1 Application topically 2 (two) times daily. Apply a small amount topically twice daily for up to 2 weeks 60 g 0   ezetimibe (ZETIA) 10 MG tablet TAKE 1 TABLET BY MOUTH EVERY DAY 90 tablet 0   gabapentin (NEURONTIN) 600 MG tablet Take 600 mg by mouth at bedtime.     glucose blood (ONETOUCH  VERIO) test strip Use as instructed to check blood sugars twice daily and once additional if elevated. 300 strip 1   hydrochlorothiazide (HYDRODIURIL) 12.5 MG tablet Take 1 tablet (12.5 mg total) by mouth daily. 90 tablet 1   ibuprofen (ADVIL) 800 MG tablet Take 1 tablet (800 mg total) by mouth every 8 (eight) hours as needed. 30 tablet 1   Magnesium 100 MG CAPS Take by mouth.     metFORMIN (GLUCOPHAGE) 500 MG tablet Take 2 tablets (1,000 mg total) by mouth daily with breakfast. 60 tablet 3   methocarbamol (ROBAXIN) 500 MG tablet Take 1 tablet (500 mg total) by mouth 2 (two) times daily. 20 tablet 0   montelukast (SINGULAIR)  10 MG tablet TAKE 1 TABLET BY MOUTH EVERYDAY AT BEDTIME 30 tablet 3   Nebulizers (COMPRESSOR/NEBULIZER) MISC As directed 1 each 0   OneTouch Delica Lancets 33G MISC 1 each by Does not apply route in the morning and at bedtime. 200 each 4   HYDROcodone bit-homatropine (HYCODAN) 5-1.5 MG/5ML syrup Take 5 mLs by mouth every 6 (six) hours as needed for cough. 90 mL 0   predniSONE (DELTASONE) 20 MG tablet Take 2 tablets (40 mg total) by mouth daily. (Patient not taking: Reported on 11/14/2023) 10 tablet 0   No facility-administered medications prior to visit.    Allergies  Allergen Reactions   Seasonal Ic [Octacosanol]     ROS Review of Systems  Constitutional:  Negative for appetite change, chills, fatigue and fever.  HENT:  Negative for congestion, postnasal drip, rhinorrhea and sneezing.   Respiratory:  Negative for cough, shortness of breath and wheezing.   Cardiovascular:  Negative for chest pain, palpitations and leg swelling.  Gastrointestinal:  Negative for abdominal pain, constipation, nausea and vomiting.  Genitourinary:  Negative for difficulty urinating, dysuria, flank pain and frequency.  Skin:  Negative for color change, pallor, rash and wound.  Neurological:  Negative for dizziness, facial asymmetry, weakness, numbness and headaches.   Psychiatric/Behavioral:  Negative for behavioral problems, confusion, self-injury and suicidal ideas.       Objective:    Physical Exam Vitals and nursing note reviewed. Exam conducted with a chaperone present.  Constitutional:      General: She is not in acute distress.    Appearance: Normal appearance. She is obese. She is not ill-appearing, toxic-appearing or diaphoretic.  HENT:     Mouth/Throat:     Mouth: Mucous membranes are moist.     Pharynx: Oropharynx is clear. No oropharyngeal exudate or posterior oropharyngeal erythema.  Eyes:     General: No scleral icterus.       Right eye: No discharge.        Left eye: No discharge.     Extraocular Movements: Extraocular movements intact.     Conjunctiva/sclera: Conjunctivae normal.  Cardiovascular:     Rate and Rhythm: Normal rate and regular rhythm.     Pulses: Normal pulses.     Heart sounds: Normal heart sounds. No murmur heard.    No friction rub. No gallop.  Pulmonary:     Effort: Pulmonary effort is normal. No respiratory distress.     Breath sounds: Normal breath sounds. No stridor. No wheezing, rhonchi or rales.  Chest:     Chest wall: No mass, lacerations, deformity, swelling, tenderness, crepitus or edema.  Breasts:    Tanner Score is 5.     Right: Nipple discharge and tenderness present. No swelling, bleeding, inverted nipple, mass or skin change.     Left: No swelling, bleeding, inverted nipple, mass, nipple discharge, skin change or tenderness.     Comments: Clear liquid discharged noted Abdominal:     General: There is no distension.     Palpations: Abdomen is soft.     Tenderness: There is no abdominal tenderness. There is no right CVA tenderness, left CVA tenderness or guarding.  Musculoskeletal:        General: No swelling, tenderness, deformity or signs of injury.     Right lower leg: No edema.     Left lower leg: No edema.  Lymphadenopathy:     Upper Body:     Right upper body: No supraclavicular,  axillary or pectoral adenopathy.  Left upper body: No supraclavicular or axillary adenopathy.  Skin:    General: Skin is warm and dry.     Capillary Refill: Capillary refill takes less than 2 seconds.     Coloration: Skin is not jaundiced or pale.     Findings: No bruising, erythema or lesion.  Neurological:     Mental Status: She is alert and oriented to person, place, and time.     Motor: No weakness.     Coordination: Coordination normal.     Gait: Gait normal.  Psychiatric:        Mood and Affect: Mood normal.        Behavior: Behavior normal.        Thought Content: Thought content normal.        Judgment: Judgment normal.     BP (!) 128/57   Pulse 67   Temp (!) 97 F (36.1 C)   Wt 255 lb (115.7 kg)   LMP  (LMP Unknown)   SpO2 98%   BMI 42.43 kg/m  Wt Readings from Last 3 Encounters:  11/14/23 255 lb (115.7 kg)  08/15/23 254 lb (115.2 kg)  08/09/23 259 lb (117.5 kg)    Lab Results  Component Value Date   TSH 1.42 01/31/2023   Lab Results  Component Value Date   WBC 5.9 01/31/2023   HGB 14.9 01/31/2023   HCT 45 01/31/2023   MCV 94.3 01/28/2023   PLT 254 01/31/2023   Lab Results  Component Value Date   NA 143 07/18/2023   K 4.2 07/18/2023   CO2 27 07/18/2023   GLUCOSE 92 07/18/2023   BUN 13 07/18/2023   CREATININE 0.76 07/18/2023   BILITOT 0.2 07/18/2023   ALKPHOS 62 07/18/2023   AST 22 07/18/2023   ALT 15 07/18/2023   PROT 6.9 07/18/2023   ALBUMIN 4.2 07/18/2023   CALCIUM 9.8 07/18/2023   ANIONGAP 14 01/28/2023   EGFR 90 07/18/2023   GFR 116.24 09/24/2017   Lab Results  Component Value Date   CHOL 216 (H) 07/18/2023   Lab Results  Component Value Date   HDL 61 07/18/2023   Lab Results  Component Value Date   LDLCALC 136 (H) 07/18/2023   Lab Results  Component Value Date   TRIG 106 07/18/2023   Lab Results  Component Value Date   CHOLHDL 3.5 07/18/2023   Lab Results  Component Value Date   HGBA1C 6.0 (A) 07/18/2023       Assessment & Plan:   Problem List Items Addressed This Visit       Cardiovascular and Mediastinum   HTN (hypertension)   Continue atenolol 25 mg daily, hydrochlorothiazide 12.5 mg daily DASH diet and commitment to daily physical activity for a minimum of 30 minutes discussed and encouraged, as a part of hypertension management.      11/14/2023   11:35 AM 11/04/2023   10:28 AM 08/15/2023   11:50 AM 08/15/2023   11:17 AM 08/09/2023    9:50 AM 08/09/2023    7:47 AM 08/09/2023    6:40 AM  BP/Weight  Systolic BP 128 141 130 114 126  126  Diastolic BP 57 77 55 53 61  57  Wt. (Lbs) 255   254  259   BMI 42.43 kg/m2   42.27 kg/m2  43.1 kg/m2            Relevant Orders   Basic Metabolic Panel     Respiratory   Viral URI with cough  Needs refills of cough medicine  - HYDROcodone bit-homatropine (HYCODAN) 5-1.5 MG/5ML syrup; Take 5 mLs by mouth every 6 (six) hours as needed for cough.  Dispense: 90 mL; Refill: 0       Relevant Medications   cephALEXin (KEFLEX) 500 MG capsule   HYDROcodone bit-homatropine (HYCODAN) 5-1.5 MG/5ML syrup     Endocrine   Type 2 diabetes mellitus with hyperglycemia, without long-term current use of insulin (HCC) - Primary   Chronic medical condition currently well-controlled with A1c of 6.2 Continue metformin 1000 mg daily Patient counseled on low-carb diet encouraged to engage in regular moderate exercises as tolerated at least [place] 50 minutes weekly Up-to-date with diabetic eye exam will obtain records from ophthalmologist       Relevant Orders   POCT glycosylated hemoglobin (Hb A1C)   Basic Metabolic Panel   Lipid panel   Vitamin B12     Other   Dyslipidemia, goal LDL below 70   Currently on Zetia 10 mg daily LDL goal is less than 70 Checking lipid panel Avoid fatty fried foods, lose weight Lab Results  Component Value Date   CHOL 216 (H) 07/18/2023   HDL 61 07/18/2023   LDLCALC 136 (H) 07/18/2023   TRIG 106 07/18/2023    CHOLHDL 3.5 07/18/2023         Breast tenderness    - cephALEXin (KEFLEX) 500 MG capsule; Take 1 capsule (500 mg total) by mouth 4 (four) times daily for 10 days.  Dispense: 40 capsule; Refill: 0  If symptoms persist after full completion of antibiotics we will order a diagnostic mammogram.  Patient encouraged to call the office if her symptoms persist      Relevant Medications   cephALEXin (KEFLEX) 500 MG capsule    Meds ordered this encounter  Medications   cephALEXin (KEFLEX) 500 MG capsule    Sig: Take 1 capsule (500 mg total) by mouth 4 (four) times daily for 10 days.    Dispense:  40 capsule    Refill:  0   HYDROcodone bit-homatropine (HYCODAN) 5-1.5 MG/5ML syrup    Sig: Take 5 mLs by mouth every 6 (six) hours as needed for cough.    Dispense:  90 mL    Refill:  0    Follow-up: Return in about 4 months (around 03/13/2024) for HTN, HYPERLIPIDEMIA.    Donell Beers, FNP

## 2023-11-14 NOTE — Assessment & Plan Note (Signed)
Currently on Zetia 10 mg daily LDL goal is less than 70 Checking lipid panel Avoid fatty fried foods, lose weight Lab Results  Component Value Date   CHOL 216 (H) 07/18/2023   HDL 61 07/18/2023   LDLCALC 136 (H) 07/18/2023   TRIG 106 07/18/2023   CHOLHDL 3.5 07/18/2023

## 2023-11-15 LAB — BASIC METABOLIC PANEL
BUN/Creatinine Ratio: 19 (ref 9–23)
BUN: 14 mg/dL (ref 6–24)
CO2: 22 mmol/L (ref 20–29)
Calcium: 10.1 mg/dL (ref 8.7–10.2)
Chloride: 98 mmol/L (ref 96–106)
Creatinine, Ser: 0.72 mg/dL (ref 0.57–1.00)
Glucose: 94 mg/dL (ref 70–99)
Potassium: 4.4 mmol/L (ref 3.5–5.2)
Sodium: 140 mmol/L (ref 134–144)
eGFR: 96 mL/min/{1.73_m2} (ref >59)

## 2023-11-15 LAB — LIPID PANEL
Chol/HDL Ratio: 3.5 {ratio} (ref 0.0–4.4)
Cholesterol, Total: 200 mg/dL — ABNORMAL HIGH (ref 100–199)
HDL: 57 mg/dL (ref >39)
LDL Chol Calc (NIH): 122 mg/dL — ABNORMAL HIGH (ref 0–99)
Triglycerides: 117 mg/dL (ref 0–149)
VLDL Cholesterol Cal: 21 mg/dL (ref 5–40)

## 2023-11-15 LAB — VITAMIN B12: Vitamin B-12: 1191 pg/mL (ref 232–1245)

## 2023-11-21 ENCOUNTER — Other Ambulatory Visit: Payer: Self-pay | Admitting: Nurse Practitioner

## 2023-11-21 ENCOUNTER — Ambulatory Visit: Payer: Self-pay | Admitting: Nurse Practitioner

## 2023-11-21 DIAGNOSIS — N760 Acute vaginitis: Secondary | ICD-10-CM

## 2023-11-21 MED ORDER — FLUCONAZOLE 150 MG PO TABS: 150.0000 mg | ORAL_TABLET | Freq: Once | ORAL | 0 refills | Status: AC

## 2023-11-21 NOTE — Telephone Encounter (Signed)
  Chief Complaint: yeast infection Symptoms: itching  Frequency: constant   Disposition: [] ED /[] Urgent Care (no appt availability in office) / [] Appointment(In office/virtual)/ []  [place] [facility]/ [x] Home Care/ [] Refused Recommended Disposition /[] [place] [facility]/ [x]  Follow-up with PCP Additional Notes: Pt is calling with complaints of a yeast infection due to being on Keflex . Pt started abx on 1/31 for URI. Pt noticed the intense itching yesterday. Pt is unsure if any discharge. RN called [facility] and agent shared provider would call in medication without being seen.  RN notified pt once received, office will update pt. Pt denies all other symptoms. RN gave home care and pt verbalized understanding.            Copied from CRM [id number]. Topic: Clinical - Medication Question >> Nov 21, 2023 10:27 AM Kinnie H wrote: Reason for CRM: patient has experienced a yeast infection from a antibiotic and needs a new one prescribed Reason for Disposition  Symptoms of a vaginal yeast infection (i.e., white, thick, cottage-cheese-like, itchy, not bad smelling discharge)  Answer Assessment - Initial Assessment Questions 1. DISCHARGE: Describe the discharge. (e.g., white, yellow, green, gray, foamy, cottage cheese-like)     Didn't look at it  2. ODOR: Is there a bad odor?     Unsure  3. ONSET: When did the discharge begin?     Yesterday  4. RASH: Is there a rash in the genital area? If Yes, ask: Describe it. (e.g., redness, blisters, sores, bumps)     Unsure  5. ABDOMEN PAIN: Are you having any abdomen pain? If Yes, ask: What does it feel like?  (e.g., crampy, dull, intermittent, constant)      Denies  6. ABDOMEN PAIN SEVERITY: If present, ask: How bad is it? (e.g., Scale 1-10; mild, moderate, or severe)   - MILD (1-3): Doesn't interfere with normal activities, abdomen soft and not tender to touch.    - MODERATE (4-7): Interferes with normal activities or awakens from  sleep, abdomen tender to touch.    - SEVERE (8-10): Excruciating pain, doubled over, unable to do any normal activities. (R/O peritonitis)      8 7. CAUSE: What do you think is causing the discharge? Have you had the same problem before? What happened then?     Currently on Keflex  500,g 8. OTHER SYMPTOMS: Do you have any other symptoms? (e.g., fever, itching, vaginal bleeding, pain with urination, injury to genital area, vaginal foreign body)     Itching  Protocols used: Vaginal Discharge-A-AH

## 2023-11-23 ENCOUNTER — Other Ambulatory Visit: Payer: Self-pay | Admitting: Nurse Practitioner

## 2023-11-23 DIAGNOSIS — J45909 Unspecified asthma, uncomplicated: Secondary | ICD-10-CM

## 2023-11-24 ENCOUNTER — Other Ambulatory Visit: Payer: Self-pay

## 2023-11-28 ENCOUNTER — Other Ambulatory Visit: Payer: Self-pay | Admitting: Nurse Practitioner

## 2023-11-28 DIAGNOSIS — N760 Acute vaginitis: Secondary | ICD-10-CM

## 2023-11-28 MED ORDER — FLUCONAZOLE 150 MG PO TABS: 150.0000 mg | ORAL_TABLET | Freq: Once | ORAL | 0 refills | Status: AC

## 2023-11-28 NOTE — Telephone Encounter (Signed)
Pt was called and advised . Form was also placed up front for pick up. KH

## 2023-12-03 ENCOUNTER — Telehealth: Payer: Self-pay

## 2023-12-03 NOTE — Telephone Encounter (Signed)
Copied from CRM [id number]. Topic: General - Other >> Dec 02, 2023  3:19 PM Carlatta H wrote: Reason for CRM: called to confirm patient diagnoses//Please call to advise [phone] Please reference # [id number]   Called back was done and application was verified. KH

## 2023-12-04 ENCOUNTER — Telehealth: Payer: Self-pay

## 2023-12-04 DIAGNOSIS — E1165 Type 2 diabetes mellitus with hyperglycemia: Secondary | ICD-10-CM

## 2023-12-04 MED ORDER — ONETOUCH VERIO FLEX SYSTEM DEVI: 1.0000 | 0 refills | Status: DC

## 2023-12-04 NOTE — Telephone Encounter (Signed)
Copied from CRM [id number]. Topic: Clinical - Prescription Issue >> Dec 04, 2023 11:10 AM Elle L wrote: Reason for CRM: The patient is requesting a new prescription for her glucose blood (ONETOUCH VERIO) monitor as hers has broken. She also states that she needs the non-generic version of the SYMBICORT 160-4.5 MCG/ACT inhaler due to her insurance for future refills. The patient's call back number is [phone] if needed.  Preferred pharmacy: [id number] - [place], [place] - [address]  Phone: [phone] Fax: [phone]  Meter was sent in pharmacy advised that pt is already getting the generic of the inhaler.

## 2023-12-05 ENCOUNTER — Telehealth: Payer: Self-pay

## 2023-12-05 ENCOUNTER — Other Ambulatory Visit: Payer: Self-pay | Admitting: Nurse Practitioner

## 2023-12-05 DIAGNOSIS — N644 Mastodynia: Secondary | ICD-10-CM

## 2023-12-05 DIAGNOSIS — J45909 Unspecified asthma, uncomplicated: Secondary | ICD-10-CM

## 2023-12-05 MED ORDER — BUDESONIDE-FORMOTEROL FUMARATE 160-4.5 MCG/ACT IN AERO: 2.0000 | INHALATION_SPRAY | Freq: Two times a day (BID) | RESPIRATORY_TRACT | 6 refills | Status: DC

## 2023-12-05 NOTE — Telephone Encounter (Signed)
Copied from CRM [id number]. Topic: Referral - Request for Referral >> Dec 04, 2023 11:03 AM Elle L wrote: Did the patient discuss referral with their provider in the last year? Yes  Appointment offered? Yes  Type of order/referral and detailed reason for visit: She has fluid coming out of her breasts that was addressed at her last appointment. She took an antibiotic but it has not helped.   Preference of office, provider, location: [facility]  If referral order, have you been seen by this specialty before? Yes for a mammogram.   Can we respond through MyChart? No, the patient's call back number is [phone].

## 2023-12-05 NOTE — Telephone Encounter (Signed)
 Pt was advise

## 2023-12-05 NOTE — Telephone Encounter (Signed)
 Pt was advise. [name]

## 2023-12-12 ENCOUNTER — Encounter: Payer: Self-pay | Admitting: Podiatry

## 2023-12-12 ENCOUNTER — Ambulatory Visit: Payer: Medicare Other | Admitting: Podiatry

## 2023-12-12 VITALS — Ht 65.0 in | Wt 255.0 lb

## 2023-12-12 DIAGNOSIS — B353 Tinea pedis: Secondary | ICD-10-CM

## 2023-12-12 DIAGNOSIS — E1165 Type 2 diabetes mellitus with hyperglycemia: Secondary | ICD-10-CM

## 2023-12-12 MED ORDER — KETOCONAZOLE 2 % EX CREA
1.0000 | TOPICAL_CREAM | Freq: Every day | CUTANEOUS | 0 refills | Status: AC
Start: 2023-12-12 — End: 2024-01-02

## 2023-12-12 NOTE — Progress Notes (Signed)
  Subjective:  Patient ID: [name], female    DOB: Oct 24, 1965,  MRN: [id number]  Chief Complaint  Patient presents with   Diabetes    Pt is here for diabetic foot exam.    58 y.o. female presents for diabetic foot exam.  Patient is able to trim her nails.  Previously she had been using some antifungal cream for her feet.  Has noticed recurrence of the rash right foot greater than left.  Does also complain of neuropathic pain and sciatica.  She does take gabapentin intermittently for this.  A1c 6.2.  Past Medical History:  Diagnosis Date   Allergy    Asthma    Bronchitis    Class 3 severe obesity due to excess calories with serious comorbidity and body mass index (BMI) of 45.0 to 49.9 in adult [facility]) 12/15/2013   Diabetes mellitus without complication (HCC)    Dyslipidemia, goal LDL below 70 04/29/2023   GERD (gastroesophageal reflux disease)    Hypertension    Lumbar degenerative disc disease 06/04/2017   Obesity    Sleep apnea    uses c pap    Vitamin D deficiency 06/30/2017    Allergies  Allergen Reactions   Seasonal Ic [Octacosanol]     ROS: Negative except as per HPI above  Objective:  General: AAO x3, NAD  Dermatological: Bilateral foot though right more notable, flaky xerotic pedal skin with mild erythema in moccasin distribution  Vascular:  Dorsalis Pedis artery and Posterior Tibial artery pedal pulses are 2/4 bilateral.  Capillary fill time < 3 sec to all digits.   Neruologic: Grossly intact via light touch bilateral. Protective threshold intact to all sites bilateral.   Musculoskeletal: No gross boney pedal deformities bilateral. No pain, crepitus, or limitation noted with foot and ankle range of motion bilateral. Muscular strength 5/5 in all groups tested bilateral.  Gait: Ambulating with a cane today  No images are attached to the encounter.  Radiographs:  Deferred Assessment:   1. Tinea pedis of both feet   2. Type 2 diabetes mellitus with  hyperglycemia, without long-term current use of insulin (HCC)      Plan:  Patient was evaluated and treated and all questions answered.  # Tinea pedis bilateral Discussed the etiology and treatment options for tinea pedis.  Discussed topical and oral treatment.  Recommended topical treatment with 2% ketoconazole cream.  This was sent to the patient's pharmacy.  Also discussed appropriate foot hygiene, use of antifungal spray such as Tinactin in shoes, as well as cleaning her foot surfaces such as showers and bathroom floors with bleach.  #DM2 with peripheral neuropathy Patient educated on diabetes. Discussed proper diabetic foot care and discussed risks and complications of disease. Educated patient in depth on reasons to return to the office immediately should he/she discover anything concerning or new on the feet. All questions answered. Discussed proper shoes as well.    Return in about 6 months (around 06/10/2024) for Diabetic Foot Care.          Payson Evrard L. Jamse Arn, DPM [facility]

## 2023-12-15 ENCOUNTER — Ambulatory Visit
Admission: RE | Admit: 2023-12-15 | Discharge: 2023-12-15 | Disposition: A | Discharge status: Home / Self Care | Payer: Medicare Other | Source: Ambulatory Visit | Attending: Nurse Practitioner | Admitting: Nurse Practitioner

## 2023-12-15 ENCOUNTER — Ambulatory Visit
Admission: RE | Admit: 2023-12-15 | Discharge: 2023-12-15 | Disposition: A | Discharge status: Home / Self Care | Payer: Medicare Other | Source: Ambulatory Visit | Attending: Nurse Practitioner

## 2023-12-15 DIAGNOSIS — N644 Mastodynia: Secondary | ICD-10-CM

## 2023-12-15 DIAGNOSIS — N6452 Nipple discharge: Secondary | ICD-10-CM | POA: Diagnosis not present

## 2023-12-17 DIAGNOSIS — H5789 Other specified disorders of eye and adnexa: Secondary | ICD-10-CM | POA: Diagnosis not present

## 2023-12-18 DIAGNOSIS — E119 Type 2 diabetes mellitus without complications: Secondary | ICD-10-CM | POA: Diagnosis not present

## 2023-12-18 DIAGNOSIS — Z79899 Other long term (current) drug therapy: Secondary | ICD-10-CM | POA: Diagnosis not present

## 2023-12-18 DIAGNOSIS — E7800 Pure hypercholesterolemia, unspecified: Secondary | ICD-10-CM | POA: Diagnosis not present

## 2023-12-18 DIAGNOSIS — H1132 Conjunctival hemorrhage, left eye: Secondary | ICD-10-CM | POA: Diagnosis not present

## 2023-12-18 DIAGNOSIS — I10 Essential (primary) hypertension: Secondary | ICD-10-CM | POA: Diagnosis not present

## 2023-12-19 ENCOUNTER — Telehealth: Payer: Self-pay

## 2023-12-19 NOTE — Transitions of Care (Post Inpatient/ED Visit) (Signed)
 12/19/2023  Name: [name] MRN: [id number] DOB: 1966/08/26  Today's TOC FU Call Status:   Patient's Name and Date of Birth confirmed.  Transition Care Management Follow-up Telephone Call Date of Discharge: 12/18/23 Discharge Facility: Other ([facility]) Name of Other ([facility]) Discharge Facility: [facility] Type of Discharge: Emergency Department Reason for ED Visit: Other: How have you been since you were released from the hospital?: Better Any questions or concerns?: No  Items Reviewed: Did you receive and understand the discharge instructions provided?: Yes Medications obtained,verified, and reconciled?: Yes (Medications Reviewed) Any new allergies since your discharge?: No Dietary orders reviewed?: No Do you have support at home?: Yes People in Home: spouse Name of Support/Comfort Primary Source: Husband- Robert  Medications Reviewed Today: Medications Reviewed Today     Reviewed by Veneta Penton, CMA (Certified Medical Assistant) on 12/19/23 at 1001  Med List Status: <None>   Medication Order Taking? Sig Documenting Provider Last Dose Status Informant  albuterol (PROVENTIL) (2.5 MG/3ML) 0.083% nebulizer solution [id number] Yes Take 3 mLs (2.5 mg total) by nebulization every 6 (six) hours as needed for wheezing. Mardella Layman, MD Taking Active   albuterol (VENTOLIN HFA) 108 (90 Base) MCG/ACT inhaler [id number] Yes Inhale 1-2 puffs into the lungs every 6 (six) hours as needed for wheezing or shortness of breath. Mardella Layman, MD Taking Active   atenolol (TENORMIN) 50 MG tablet [id number] Yes Take 0.5 tablets (25 mg total) by mouth daily. Donell Beers, FNP Taking Active   Blood Glucose Monitoring Suppl [facility] VERIO FLEX SYSTEM) DEVI [id number] Yes 1 each by Does not apply route as directed. To check blood sugars Paseda, Baird Kay, FNP Taking Active   budesonide-formoterol [facility]) 160-4.5 MCG/ACT inhaler [id number] Yes Inhale 2 puffs into  the lungs in the morning and at bedtime. Donell Beers, FNP Taking Active   clobetasol ointment (TEMOVATE) 0.05 % [id number] Yes Apply 1 Application topically 2 (two) times daily. Apply a small amount topically twice daily for up to 2 weeks Romualdo Bolk, MD Taking Active            Med Note Sherilyn Cooter, [facility]   Fri Jul 18, 2023 10:54 AM) prn  ezetimibe (ZETIA) 10 MG tablet [id number] Yes TAKE 1 TABLET BY MOUTH EVERY DAY Ivonne Andrew, NP Taking Active   gabapentin (NEURONTIN) 600 MG tablet [id number] Yes Take 600 mg by mouth at bedtime. [provider] Taking Active   glucose blood (ONETOUCH VERIO) test strip [id number] Yes Use as instructed to check blood sugars twice daily and once additional if elevated. Donell Beers, FNP Taking Active   hydrochlorothiazide (HYDRODIURIL) 12.5 MG tablet [id number] Yes Take 1 tablet (12.5 mg total) by mouth daily. Donell Beers, FNP Taking Active   HYDROcodone bit-homatropine (HYCODAN) 5-1.5 MG/5ML syrup [id number] No Take 5 mLs by mouth every 6 (six) hours as needed for cough.  Patient not taking: Reported on 12/19/2023   Donell Beers, FNP Not Taking Active   ibuprofen (ADVIL) 800 MG tablet [id number] Yes Take 1 tablet (800 mg total) by mouth every 8 (eight) hours as needed. Donell Beers, FNP Taking Active   ketoconazole (NIZORAL) 2 % cream [id number] Yes Apply 1 Application topically daily for 21 days. Barbaraann Share, DPM Taking Active   Magnesium 100 MG CAPS [id number] Yes Take by mouth. [provider] Taking Active   metFORMIN (GLUCOPHAGE) 500 MG tablet [id number] Yes Take 2 tablets (1,000 mg total) by mouth daily with breakfast. Paseda,  Baird Kay, FNP Taking Active   methocarbamol (ROBAXIN) 500 MG tablet [id number] Yes Take 1 tablet (500 mg total) by mouth 2 (two) times daily. Linwood Dibbles, MD Taking Active            Med Note Sherilyn Cooter, [facility]   Fri Nov 14, 2023 11:38 AM) Prn   montelukast (SINGULAIR) 10 MG  tablet [id number] Yes TAKE 1 TABLET BY MOUTH EVERYDAY AT BEDTIME Oretha Milch, MD Taking Active   Nebulizers (COMPRESSOR/NEBULIZER) MISC [id number] Yes As directed Donato Schultz, DO Taking Active Self  OneTouch Delica Lancets 33G MISC [id number] Yes 1 each by Does not apply route in the morning and at bedtime. Donell Beers, FNP Taking Active             Home Care and Equipment/Supplies: Were Home Health Services Ordered?: No Any new equipment or medical supplies ordered?: No  Functional Questionnaire: Do you need assistance with bathing/showering or dressing?: No Do you need assistance with meal preparation?: No Do you need assistance with eating?: No Do you have difficulty maintaining continence: No Do you need assistance with getting out of bed/getting out of a chair/moving?: No Do you have difficulty managing or taking your medications?: No  Follow up appointments reviewed: PCP Follow-up appointment confirmed?: Yes Date of PCP follow-up appointment?: 12/23/23 Specialist Hospital Follow-up appointment confirmed?: NA Do you need transportation to your follow-up appointment?: No Do you understand care options if your condition(s) worsen?: Yes-patient verbalized understanding  SDOH Interventions Today    Flowsheet Row Most Recent Value  SDOH Interventions   Food Insecurity Interventions Other (Comment)  Schuyler Amor gave resorces for food banks]       SIGNATURE Nyriah Coote, RMA

## 2023-12-22 ENCOUNTER — Ambulatory Visit (HOSPITAL_BASED_OUTPATIENT_CLINIC_OR_DEPARTMENT_OTHER): Payer: Medicare HMO | Admitting: Pulmonary Disease

## 2023-12-22 ENCOUNTER — Encounter (HOSPITAL_BASED_OUTPATIENT_CLINIC_OR_DEPARTMENT_OTHER): Payer: Self-pay | Admitting: Pulmonary Disease

## 2023-12-22 VITALS — BP 130/82 | HR 67 | Ht 65.0 in | Wt 262.9 lb

## 2023-12-22 DIAGNOSIS — J4520 Mild intermittent asthma, uncomplicated: Secondary | ICD-10-CM | POA: Diagnosis not present

## 2023-12-22 DIAGNOSIS — G4733 Obstructive sleep apnea (adult) (pediatric): Secondary | ICD-10-CM | POA: Diagnosis not present

## 2023-12-22 DIAGNOSIS — J4530 Mild persistent asthma, uncomplicated: Secondary | ICD-10-CM

## 2023-12-22 NOTE — Patient Instructions (Signed)
  Congratulations on weight loss.  Home sleep test  Referral to sleep dentist  Meanwhile, use CPAP every night at least 6 hours

## 2023-12-22 NOTE — Progress Notes (Signed)
 Subjective:    Patient ID: [name], female    DOB: 1965/11/01, 58 y.o.   MRN: [id number]  HPI  58 yo never smoker for FU of asthma and mod obstructive sleep apnea.  -On auto CPAP    Asthma was diagnosed in 2006. Her triggers include uri and weather changes. h/o seasonal allergies >>  No formal allergy testing   PMH - HFpEF   The patient, with a history of mild intermittent asthma and sleep apnea, presents for a routine follow-up. She expresses a desire to discontinue her CPAP machine for sleep apnea, citing discomfort and a significant weight loss of approximately 50 pounds since her last visit. The patient attributes the weight loss to dietary changes necessitated by a recent diagnosis of diabetes. She denies using any weight loss injections and expresses fear of injections due to her current regimen of twice-daily insulin injections for diabetes management.  The patient's asthma is reportedly well-controlled with twice-daily Symbicort, although she experienced a flare-up about a month ago, which she attributes to cold weather. This flare-up, which she describes as a combination of bronchitis and asthma, resulted in a persistent cough that required medical intervention. She reports that her symptoms have since resolved.  The patient also reports occasional dry mouth, which she believes may be related to her CPAP machine, despite using the machine's automatic humidity feature. She expresses interest in exploring alternative treatments for sleep apnea, such as a dental mouthpiece or a nasal device, although she acknowledges that the CPAP machine has been effective in reducing her sleep apnea symptoms.  Peak wt 302  CPAP download shows reasonable compliance close to 6 hours every night on 9 cm with excellent control of events and minimal leak.  CPAP is only helped improve her daytime somnolence and fatigue   Significant tests/ events reviewed   01/2022 spirometry shows no  obstruction with ratio 84, FEV1 73%, FVC 69%   01/29/2012 Spirometry showed no airway obstruction, FEV1 was 82% FVC was 79% ratio 79 PSG 2013 moderate obstructive sleep apnea - AHI 18/h \     Review of Systems neg for any significant sore throat, dysphagia, itching, sneezing, nasal congestion or excess/ purulent secretions, fever, chills, sweats, unintended wt loss, pleuritic or exertional cp, hempoptysis, orthopnea pnd or change in chronic leg swelling. Also denies presyncope, palpitations, heartburn, abdominal pain, nausea, vomiting, diarrhea or change in bowel or urinary habits, dysuria,hematuria, rash, arthralgias, visual complaints, headache, numbness weakness or ataxia.     Objective:   Physical Exam  Gen. Pleasant, obese, in no distress ENT - no lesions, no post nasal drip Neck: No JVD, no thyromegaly, no carotid bruits Lungs: no use of accessory muscles, no dullness to percussion, decreased without rales or rhonchi  Cardiovascular: Rhythm regular, heart sounds  normal, no murmurs or gallops, no peripheral edema Musculoskeletal: No deformities, no cyanosis or clubbing , no tremors       Assessment & Plan:   Obstructive Sleep Apnea Desires to discontinue CPAP due to discomfort and significant weight loss (~50 lbs). CPAP compliance is variable, with dry mouth possibly due to gabapentin or environmental factors. CPAP reduces apnea events to less than one per hour. Discussed alternative treatment with a dental mouthpiece, which can cause discomfort and requires fitting by a dentist. - Order home sleep test - Provide referrals to dentists for potential mouthpiece fitting - Advise to continue using CPAP until reassessment  Asthma Mild intermittent asthma with recent exacerbation one month ago, requiring medical intervention.  Currently well-controlled with Symbicort (twice daily). - Continue Symbicort twice daily - Monitor for further exacerbations, especially with weather  changes  General Health Maintenance Significant weight loss achieved through dietary changes for diabetes management. Encouraged to continue weight loss efforts. - Encourage continued weight loss efforts - Discuss potential benefits of weight loss on sleep apnea and overall health

## 2023-12-23 ENCOUNTER — Encounter: Payer: Self-pay | Admitting: Nurse Practitioner

## 2023-12-23 ENCOUNTER — Ambulatory Visit (INDEPENDENT_AMBULATORY_CARE_PROVIDER_SITE_OTHER): Payer: Self-pay | Admitting: Nurse Practitioner

## 2023-12-23 VITALS — BP 127/62 | HR 73 | Temp 97.2°F | Wt 262.0 lb

## 2023-12-23 DIAGNOSIS — J309 Allergic rhinitis, unspecified: Secondary | ICD-10-CM | POA: Diagnosis not present

## 2023-12-23 DIAGNOSIS — E785 Hyperlipidemia, unspecified: Secondary | ICD-10-CM | POA: Diagnosis not present

## 2023-12-23 DIAGNOSIS — H1011 Acute atopic conjunctivitis, right eye: Secondary | ICD-10-CM | POA: Diagnosis not present

## 2023-12-23 DIAGNOSIS — I10 Essential (primary) hypertension: Secondary | ICD-10-CM | POA: Diagnosis not present

## 2023-12-23 DIAGNOSIS — Z09 Encounter for follow-up examination after completed treatment for conditions other than malignant neoplasm: Secondary | ICD-10-CM | POA: Insufficient documentation

## 2023-12-23 DIAGNOSIS — K5900 Constipation, unspecified: Secondary | ICD-10-CM | POA: Diagnosis not present

## 2023-12-23 DIAGNOSIS — R140 Abdominal distension (gaseous): Secondary | ICD-10-CM | POA: Insufficient documentation

## 2023-12-23 DIAGNOSIS — H1132 Conjunctival hemorrhage, left eye: Secondary | ICD-10-CM | POA: Diagnosis not present

## 2023-12-23 MED ORDER — FLUVASTATIN SODIUM 40 MG PO CAPS: 40.0000 mg | ORAL_CAPSULE | Freq: Every day | ORAL | 0 refills | Status: DC

## 2023-12-23 MED ORDER — OLOPATADINE HCL 0.1 % OP SOLN: 1.0000 [drp] | Freq: Two times a day (BID) | OPHTHALMIC | 0 refills | Status: DC

## 2023-12-23 MED ORDER — POLYETHYLENE GLYCOL 3350 17 G PO PACK: 17.0000 g | PACK | Freq: Every day | ORAL | 0 refills | Status: DC | PRN

## 2023-12-23 NOTE — Assessment & Plan Note (Signed)
-   olopatadine (PATANOL) 0.1 % ophthalmic solution; Place 1 drop into the right eye 2 (two) times daily.  Dispense: 5 mL; Refill: 0

## 2023-12-23 NOTE — Assessment & Plan Note (Addendum)
 Lab Results  Component Value Date   CHOL 200 (H) 11/14/2023   HDL 57 11/14/2023   LDLCALC 122 (H) 11/14/2023   TRIG 117 11/14/2023   CHOLHDL 3.5 11/14/2023  Continue Zetia 10 mg daily Start fluvastatin (LESCOL) 40 MG capsule; Take 1 capsule (40 mg total) by mouth at bedtime.  Dispense: 60 capsule; Refill: 0 Avoid fatty fried foods Patient encouraged to report muscle aches Labs in 6 weeks

## 2023-12-23 NOTE — Assessment & Plan Note (Signed)
 Drink at least 64 ounces of water daily Increase intake of fibers - polyethylene glycol (MIRALAX MIX-IN PAX) 17 g packet; Take 17 g by mouth daily as needed.  Dispense: 14 each; Refill: 0

## 2023-12-23 NOTE — Assessment & Plan Note (Signed)
 Continue singular 10 mg daily

## 2023-12-23 NOTE — Assessment & Plan Note (Signed)
 Hospital chart reviewed, including discharge summary Medications reconciled and reviewed with the patient in detail

## 2023-12-23 NOTE — Progress Notes (Addendum)
 Established Patient Office Visit  Subjective:  Patient ID: [name], female    DOB: [birth year]  Age: 58 y.o. MRN: [id number]  CC:  Chief Complaint  Patient presents with   Hospitalization Follow-up    HPI [name] is a 58 y.o. female  has a past medical history of Allergy, Asthma, Bronchitis, Class 3 severe obesity due to excess calories with serious comorbidity and body mass index (BMI) of 45.0 to 49.9 in adult [facility]) (12/15/2013), Diabetes mellitus without complication (HCC), Dyslipidemia, goal LDL below 70 (04/29/2023), GERD (gastroesophageal reflux disease), Hypertension, Lumbar degenerative disc disease (06/04/2017), Obesity, Sleep apnea, and Vitamin D deficiency (06/30/2017).   Patient presents for hospitalization follow-up.    Subconjunctival hemorrhage.  Patient was at the emergency room on 12/18/2023 for some conjunctival hemorrhage of left eye.  She was prescribed erythromycin ophthalmic ointment daily prophylactically and recommended to follow-up with ophthalmology for reevaluation, states that she has been using the ointment , her eye redness is now resolved and she denies eye pain, changes in her vision.  She has not followed up with ophthalmologist.  Allergic conjunctivitis of right eye patient complains of itchy right eye ongoing for about a month, she tried taking Benadryl that helped the itching but she has started itching again once she stopped taking Benadryl.  She denies eye pain, watery eyes, eye redness.  Constipation.  Patient complains of bloating, constipation, states that she goes 3 to 4 days sometimes without having a bowel movement.  She denies abdominal pain, nausea, vomiting,   Hyperlipidemia.  Currently on Zetia 10 mg daily but LDL is not at goal of less than 70.  She reports muscle aches and fatigue with Crestor, she is willing to try another statin instead of taking Repatha.       Past Medical History:  Diagnosis Date    Allergy    Asthma    Bronchitis    Class 3 severe obesity due to excess calories with serious comorbidity and body mass index (BMI) of 45.0 to 49.9 in adult [facility]) 12/15/2013   Diabetes mellitus without complication (HCC)    Dyslipidemia, goal LDL below 70 04/29/2023   GERD (gastroesophageal reflux disease)    Hypertension    Lumbar degenerative disc disease 06/04/2017   Obesity    Sleep apnea    uses c pap    Vitamin D deficiency 06/30/2017    Past Surgical History:  Procedure Laterality Date   DENTAL SURGERY     ECTOPIC PREGNANCY SURGERY  2006    Family History  Problem Relation Age of Onset   Hypertension Father    Asthma Father    Hypertension Sister    Hypertension Sister    Stroke Paternal Grandfather    Hypertension Paternal Grandfather    Colon cancer Neg Hx    Colon polyps Neg Hx    Esophageal cancer Neg Hx    Rectal cancer Neg Hx    Stomach cancer Neg Hx    Breast cancer Neg Hx     Social History   Socioeconomic History   Marital status: Married    Spouse name: Not on file   Number of children: 2   Years of education: Not on file   Highest education level: Not on file  Occupational History   Not on file  Tobacco Use   Smoking status: Never   Smokeless tobacco: Never  Vaping Use   Vaping status: Never Used  Substance and Sexual Activity   Alcohol  use: Not Currently    Comment: rare   Drug use: No   Sexual activity: Yes    Birth control/protection: Post-menopausal    Comment: >5 sexual partners  Other Topics Concern   Not on file  Social History Narrative   Lives with her husband   Social Drivers of Health   Financial Resource Strain: Medium Risk (05/29/2023)   Overall Financial Resource Strain (CARDIA)    Difficulty of Paying Living Expenses: Somewhat hard  Food Insecurity: Food Insecurity Present (12/19/2023)   Hunger Vital Sign    Worried About Running Out of Food in the Last Year: Sometimes true    Ran Out of Food in the Last Year:  Sometimes true  Transportation Needs: No Transportation Needs (05/29/2023)   PRAPARE - Administrator, Civil Service (Medical): No    Lack of Transportation (Non-Medical): No  Physical Activity: Insufficiently Active (05/29/2023)   Exercise Vital Sign    Days of Exercise per Week: 7 days    Minutes of Exercise per Session: 20 min  Stress: No Stress Concern Present (05/29/2023)   [employer] of Occupational Health - Occupational Stress Questionnaire    Feeling of Stress : Not at all  Social Connections: Moderately Integrated (05/29/2023)   Social Connection and Isolation Panel [NHANES]    Frequency of Communication with Friends and Family: More than three times a week    Frequency of Social Gatherings with Friends and Family: More than three times a week    Attends Religious Services: More than 4 times per year    Active Member of [employer] or Organizations: No    Attends Banker Meetings: Never    Marital Status: Married  Catering manager Violence: Not At Risk (12/18/2023)   Received from [facility]   HITS    Over the last 12 months how often did your partner physically hurt you?: Never    Over the last 12 months how often did your partner insult you or talk down to you?: Never    Over the last 12 months how often did your partner threaten you with physical harm?: Never    Over the last 12 months how often did your partner scream or curse at you?: Never    Outpatient Medications Prior to Visit  Medication Sig Dispense Refill   albuterol (PROVENTIL) (2.5 MG/3ML) 0.083% nebulizer solution Take 3 mLs (2.5 mg total) by nebulization every 6 (six) hours as needed for wheezing. 75 mL 1   albuterol (VENTOLIN HFA) 108 (90 Base) MCG/ACT inhaler Inhale 1-2 puffs into the lungs every 6 (six) hours as needed for wheezing or shortness of breath. 1 each 1   atenolol (TENORMIN) 50 MG tablet Take 0.5 tablets (25 mg total) by mouth daily. 90 tablet 0   Blood Glucose Monitoring  Suppl (ONETOUCH VERIO FLEX SYSTEM) DEVI 1 each by Does not apply route as directed. To check blood sugars 1 each 0   budesonide-formoterol (SYMBICORT) 160-4.5 MCG/ACT inhaler Inhale 2 puffs into the lungs in the morning and at bedtime. 10.2 each 6   clobetasol ointment (TEMOVATE) 0.05 % Apply 1 Application topically 2 (two) times daily. Apply a small amount topically twice daily for up to 2 weeks 60 g 0   erythromycin ophthalmic ointment Place into the left eye in the morning and at bedtime.     ezetimibe (ZETIA) 10 MG tablet TAKE 1 TABLET BY MOUTH EVERY DAY 90 tablet 0   gabapentin (NEURONTIN) 600 MG tablet Take  600 mg by mouth at bedtime.     glucose blood (ONETOUCH VERIO) test strip Use as instructed to check blood sugars twice daily and once additional if elevated. 300 strip 1   hydrochlorothiazide (HYDRODIURIL) 12.5 MG tablet Take 1 tablet (12.5 mg total) by mouth daily. 90 tablet 1   ibuprofen (ADVIL) 800 MG tablet Take 1 tablet (800 mg total) by mouth every 8 (eight) hours as needed. 30 tablet 1   ketoconazole (NIZORAL) 2 % cream Apply 1 Application topically daily for 21 days. 21 g 0   Magnesium 100 MG CAPS Take by mouth.     metFORMIN (GLUCOPHAGE) 500 MG tablet Take 2 tablets (1,000 mg total) by mouth daily with breakfast. 60 tablet 3   montelukast (SINGULAIR) 10 MG tablet TAKE 1 TABLET BY MOUTH EVERYDAY AT BEDTIME 30 tablet 3   Nebulizers (COMPRESSOR/NEBULIZER) MISC As directed 1 each 0   OneTouch Delica Lancets 33G MISC 1 each by Does not apply route in the morning and at bedtime. 200 each 4   HYDROcodone bit-homatropine (HYCODAN) 5-1.5 MG/5ML syrup Take 5 mLs by mouth every 6 (six) hours as needed for cough. (Patient not taking: Reported on 12/23/2023) 90 mL 0   methocarbamol (ROBAXIN) 500 MG tablet Take 1 tablet (500 mg total) by mouth 2 (two) times daily. (Patient not taking: Reported on 12/23/2023) 20 tablet 0   No facility-administered medications prior to visit.    Allergies   Allergen Reactions   Rosuvastatin Other (See Comments)   Seasonal Ic [Octacosanol]     ROS Review of Systems  Constitutional:  Negative for appetite change, chills, fatigue and fever.  HENT:  Negative for dental problem, drooling, ear discharge and ear pain.   Eyes:  Positive for itching. Negative for pain, discharge, redness and visual disturbance.  Respiratory:  Negative for cough, shortness of breath and wheezing.   Cardiovascular:  Negative for chest pain, palpitations and leg swelling.  Gastrointestinal:  Positive for constipation. Negative for abdominal pain, nausea and vomiting.  Genitourinary:  Negative for difficulty urinating, dysuria, flank pain and frequency.  Skin:  Negative for color change, pallor, rash and wound.  Neurological:  Negative for dizziness, facial asymmetry, weakness, numbness and headaches.  Psychiatric/Behavioral:  Negative for behavioral problems, confusion, self-injury and suicidal ideas.       Objective:    Physical Exam Vitals and nursing note reviewed.  Constitutional:      General: She is not in acute distress.    Appearance: Normal appearance. She is obese. She is not ill-appearing, toxic-appearing or diaphoretic.  HENT:     Mouth/Throat:     Mouth: Mucous membranes are moist.     Pharynx: Oropharynx is clear. No oropharyngeal exudate or posterior oropharyngeal erythema.  Eyes:     General: No scleral icterus.       Right eye: No discharge.        Left eye: No discharge.     Extraocular Movements: Extraocular movements intact.     Conjunctiva/sclera: Conjunctivae normal.  Cardiovascular:     Rate and Rhythm: Normal rate and regular rhythm.     Pulses: Normal pulses.     Heart sounds: Normal heart sounds. No murmur heard.    No friction rub. No gallop.  Pulmonary:     Effort: Pulmonary effort is normal. No respiratory distress.     Breath sounds: Normal breath sounds. No stridor. No wheezing, rhonchi or rales.  Chest:     Chest wall:  No tenderness.  Abdominal:     General: There is no distension.     Palpations: Abdomen is soft.     Tenderness: There is no abdominal tenderness. There is no right CVA tenderness, left CVA tenderness or guarding.  Musculoskeletal:        General: No swelling or signs of injury.     Right lower leg: No edema.     Left lower leg: No edema.  Skin:    General: Skin is warm and dry.     Capillary Refill: Capillary refill takes less than 2 seconds.     Coloration: Skin is not jaundiced or pale.     Findings: No bruising, erythema or lesion.  Neurological:     Mental Status: She is alert and oriented to person, place, and time.     Motor: No weakness.     Coordination: Coordination normal.     Gait: Gait normal.  Psychiatric:        Mood and Affect: Mood normal.        Behavior: Behavior normal.        Thought Content: Thought content normal.        Judgment: Judgment normal.     BP 127/62   Pulse 73   Temp (!) 97.2 F (36.2 C)   Wt 262 lb (118.8 kg)   LMP  (LMP Unknown)   SpO2 93%   BMI 43.60 kg/m  Wt Readings from Last 3 Encounters:  12/23/23 262 lb (118.8 kg)  12/22/23 262 lb 14.4 oz (119.3 kg)  12/12/23 255 lb (115.7 kg)    Lab Results  Component Value Date   TSH 1.42 01/31/2023   Lab Results  Component Value Date   WBC 5.9 01/31/2023   HGB 14.9 01/31/2023   HCT 45 01/31/2023   MCV 94.3 01/28/2023   PLT 254 01/31/2023   Lab Results  Component Value Date   NA 140 11/14/2023   K 4.4 11/14/2023   CO2 22 11/14/2023   GLUCOSE 94 11/14/2023   BUN 14 11/14/2023   CREATININE 0.72 11/14/2023   BILITOT 0.2 07/18/2023   ALKPHOS 62 07/18/2023   AST 22 07/18/2023   ALT 15 07/18/2023   PROT 6.9 07/18/2023   ALBUMIN 4.2 07/18/2023   CALCIUM 10.1 11/14/2023   ANIONGAP 14 01/28/2023   EGFR 96 11/14/2023   GFR 116.24 09/24/2017   Lab Results  Component Value Date   CHOL 200 (H) 11/14/2023   Lab Results  Component Value Date   HDL 57 11/14/2023   Lab  Results  Component Value Date   LDLCALC 122 (H) 11/14/2023   Lab Results  Component Value Date   TRIG 117 11/14/2023   Lab Results  Component Value Date   CHOLHDL 3.5 11/14/2023   Lab Results  Component Value Date   HGBA1C 6.2 (A) 11/14/2023      Assessment & Plan:   Problem List Items Addressed This Visit       Cardiovascular and Mediastinum   HTN (hypertension)   BP Readings from Last 3 Encounters:  12/23/23 127/62  12/22/23 130/82  11/14/23 (!) 128/57  Well-controlled on atenolol 25 mg daily and hydrochlorothiazide 12.5 mg daily Continue current medications      Relevant Medications   fluvastatin (LESCOL) 40 MG capsule     Respiratory   Allergic rhinitis   Continue singular 10 mg daily        Other   Dyslipidemia, goal LDL below 70   Lab Results  Component  Value Date   CHOL 200 (H) 11/14/2023   HDL 57 11/14/2023   LDLCALC 122 (H) 11/14/2023   TRIG 117 11/14/2023   CHOLHDL 3.5 11/14/2023  Continue Zetia 10 mg daily Start fluvastatin (LESCOL) 40 MG capsule; Take 1 capsule (40 mg total) by mouth at bedtime.  Dispense: 60 capsule; Refill: 0 Avoid fatty fried foods Patient encouraged to report muscle aches Labs in 6 weeks        Relevant Medications   fluvastatin (LESCOL) 40 MG capsule   Allergic conjunctivitis of right eye    - olopatadine (PATANOL) 0.1 % ophthalmic solution; Place 1 drop into the right eye 2 (two) times daily.  Dispense: 5 mL; Refill: 0        Relevant Medications   olopatadine (PATANOL) 0.1 % ophthalmic solution   Constipation   Drink at least 64 ounces of water daily Increase intake of fibers - polyethylene glycol (MIRALAX MIX-IN PAX) 17 g packet; Take 17 g by mouth daily as needed.  Dispense: 14 each; Refill: 0       Relevant Medications   polyethylene glycol (MIRALAX MIX-IN PAX) 17 g packet   Subconjunctival hemorrhage of left eye - Primary   Now resolved,  She was prophylactically treated with erythromycin  ophthalmic ointment twice daily       Encounter for examination following treatment at hospital   Hospital chart reviewed, including discharge summary Medications reconciled and reviewed with the patient in detail       Bloating   Starting MiraLAX daily as needed for constipation Encouraged to avoid foods that cause bloating like beans, broccoli , educational material provided       Meds ordered this encounter  Medications   fluvastatin (LESCOL) 40 MG capsule    Sig: Take 1 capsule (40 mg total) by mouth at bedtime.    Dispense:  60 capsule    Refill:  0   olopatadine (PATANOL) 0.1 % ophthalmic solution    Sig: Place 1 drop into the right eye 2 (two) times daily.    Dispense:  5 mL    Refill:  0   polyethylene glycol (MIRALAX MIX-IN PAX) 17 g packet    Sig: Take 17 g by mouth daily as needed.    Dispense:  14 each    Refill:  0    Follow-up: Return in about 6 weeks (around 02/03/2024) for HYPERLIPIDEMIA.    Donell Beers, FNP

## 2023-12-23 NOTE — Assessment & Plan Note (Signed)
 Now resolved,  She was prophylactically treated with erythromycin ophthalmic ointment twice daily

## 2023-12-23 NOTE — Patient Instructions (Addendum)
 1. Dyslipidemia, goal LDL below 70 (Primary)  - fluvastatin (LESCOL) 40 MG capsule; Take 1 capsule (40 mg total) by mouth at bedtime.  Dispense: 60 capsule; Refill: 0  2. Allergic conjunctivitis of right eye  - olopatadine (PATANOL) 0.1 % ophthalmic solution; Place 1 drop into the right eye 2 (two) times daily.  Dispense: 5 mL; Refill: 0  3. Constipation, unspecified constipation type  - polyethylene glycol (MIRALAX MIX-IN PAX) 17 g packet; Take 17 g by mouth daily as needed.  Dispense: 14 each; Refill: 0        It is important that you exercise regularly at least 30 minutes 5 times a week as tolerated  Think about what you will eat, plan ahead. Choose " clean, green, fresh or frozen" over canned, processed or packaged foods which are more sugary, salty and fatty. 70 to 75% of food eaten should be vegetables and fruit. Three meals at set times with snacks allowed between meals, but they must be fruit or vegetables. Aim to eat over a 12 hour period , example 7 am to 7 pm, and STOP after  your last meal of the day. Drink water,generally about 64 ounces per day, no other drink is as healthy. Fruit juice is best enjoyed in a healthy way, by EATING the fruit.  Thanks for choosing [facility] we consider it a privelige to serve you.

## 2023-12-23 NOTE — Assessment & Plan Note (Addendum)
 Starting MiraLAX daily as needed for constipation Encouraged to avoid foods that cause bloating like beans, broccoli , educational material provided

## 2023-12-23 NOTE — Assessment & Plan Note (Signed)
 BP Readings from Last 3 Encounters:  12/23/23 127/62  12/22/23 130/82  11/14/23 (!) 128/57  Well-controlled on atenolol 25 mg daily and hydrochlorothiazide 12.5 mg daily Continue current medications

## 2023-12-26 ENCOUNTER — Other Ambulatory Visit: Payer: Self-pay | Admitting: Nurse Practitioner

## 2023-12-26 DIAGNOSIS — K5900 Constipation, unspecified: Secondary | ICD-10-CM

## 2023-12-29 ENCOUNTER — Telehealth: Payer: Self-pay

## 2023-12-29 ENCOUNTER — Other Ambulatory Visit: Payer: Self-pay | Admitting: Nurse Practitioner

## 2023-12-29 DIAGNOSIS — G4730 Sleep apnea, unspecified: Secondary | ICD-10-CM | POA: Diagnosis not present

## 2023-12-29 DIAGNOSIS — E785 Hyperlipidemia, unspecified: Secondary | ICD-10-CM

## 2023-12-29 NOTE — Telephone Encounter (Signed)
 Please advise [facility]

## 2023-12-30 ENCOUNTER — Telehealth: Payer: Self-pay

## 2023-12-30 ENCOUNTER — Ambulatory Visit: Admitting: Adult Health

## 2023-12-30 DIAGNOSIS — G4730 Sleep apnea, unspecified: Secondary | ICD-10-CM | POA: Diagnosis not present

## 2023-12-30 DIAGNOSIS — G4733 Obstructive sleep apnea (adult) (pediatric): Secondary | ICD-10-CM

## 2023-12-30 NOTE — Telephone Encounter (Signed)
 Called to report that Pt PVD score was .74 She stated this is mild to moderate and want to make you aware just in case you want  a doppler.  Please advise [facility]

## 2023-12-30 NOTE — Telephone Encounter (Signed)
 Copied from CRM [id number]. Topic: Clinical - Lab/Test Results >> Dec 30, 2023  3:27 PM Higinio Roger wrote: Reason for CRM: Lanell Persons (NP) would like a callback to provide lab results to Edwin Dada on the patient  Callback #: [phone]  Message was given to [facility] and she will follow up. KH

## 2023-12-30 NOTE — Telephone Encounter (Signed)
Error Brayton Caves

## 2024-01-02 ENCOUNTER — Other Ambulatory Visit: Payer: Medicare Other

## 2024-01-11 ENCOUNTER — Other Ambulatory Visit: Payer: Self-pay | Admitting: Nurse Practitioner

## 2024-01-11 DIAGNOSIS — E785 Hyperlipidemia, unspecified: Secondary | ICD-10-CM

## 2024-01-11 DIAGNOSIS — J302 Other seasonal allergic rhinitis: Secondary | ICD-10-CM

## 2024-01-13 ENCOUNTER — Telehealth: Payer: Self-pay | Admitting: Pulmonary Disease

## 2024-01-13 DIAGNOSIS — G4733 Obstructive sleep apnea (adult) (pediatric): Secondary | ICD-10-CM | POA: Diagnosis not present

## 2024-01-13 NOTE — Telephone Encounter (Signed)
 HST showed mild  OSA with AHI 10/ hr & low sat 80% Suggest  referral to sleep dentist for oral appliance since she could not tolerate CPAP

## 2024-01-14 NOTE — Telephone Encounter (Signed)
 Pt notified and referral placed

## 2024-01-27 DIAGNOSIS — G4733 Obstructive sleep apnea (adult) (pediatric): Secondary | ICD-10-CM | POA: Diagnosis not present

## 2024-02-06 ENCOUNTER — Ambulatory Visit (INDEPENDENT_AMBULATORY_CARE_PROVIDER_SITE_OTHER): Payer: Self-pay | Admitting: Nurse Practitioner

## 2024-02-06 ENCOUNTER — Encounter: Payer: Self-pay | Admitting: Nurse Practitioner

## 2024-02-06 VITALS — BP 135/51 | HR 57 | Wt 263.0 lb

## 2024-02-06 DIAGNOSIS — E785 Hyperlipidemia, unspecified: Secondary | ICD-10-CM

## 2024-02-06 DIAGNOSIS — I739 Peripheral vascular disease, unspecified: Secondary | ICD-10-CM | POA: Diagnosis not present

## 2024-02-06 DIAGNOSIS — E1165 Type 2 diabetes mellitus with hyperglycemia: Secondary | ICD-10-CM | POA: Diagnosis not present

## 2024-02-06 DIAGNOSIS — M51361 Other intervertebral disc degeneration, lumbar region with lower extremity pain only: Secondary | ICD-10-CM

## 2024-02-06 DIAGNOSIS — I10 Essential (primary) hypertension: Secondary | ICD-10-CM | POA: Diagnosis not present

## 2024-02-06 MED ORDER — GABAPENTIN 600 MG PO TABS
600.0000 mg | ORAL_TABLET | Freq: Every day | ORAL | 1 refills | Status: DC
Start: 2024-02-06 — End: 2024-08-03

## 2024-02-06 NOTE — Progress Notes (Signed)
 Established Patient Office Visit  Subjective:  Patient ID: [name], female    DOB: May 17, 1966  Age: 58 y.o. MRN: [id number]  CC: No chief complaint on file.   HPI [name] is a 58 y.o. female  has a past medical history of Allergy , Asthma, Bronchitis, Class 3 severe obesity due to excess calories with serious comorbidity and body mass index (BMI) of 45.0 to 49.9 in adult [facility]) (12/15/2013), Diabetes mellitus without complication (HCC), Dyslipidemia, goal LDL below 70 (04/29/2023), GERD (gastroesophageal reflux disease), Hypertension, Lumbar degenerative disc disease (06/04/2017), Obesity, Sleep apnea, and Vitamin D  deficiency (06/30/2017).  Patient presents for follow-up for hyperlipidemia  Hyperlipidemia.  Currently on Zetia  10 mg daily, was prescribed fluvastatin  40 mg daily but she just picked up the medication and has not started taking it.  She had a PAD testing done by her insurance at home the results shows mild/moderate blockage in the right lower extremities.    Chronic low back pain.  Has done physical therapy and water aerobics in the past, will try aerobics helped her pain.  She was getting spinal injections that did not help, she was told that surgery would not help her back pain.  She is currently on disability for her back pain, takes gabapentin  600 mg at bedtime as needed instead of daily, takes ibuprofen  800 mg 3 times daily as needed.  Has a prescription for Robaxin  but she does not take the medication    Past Medical History:  Diagnosis Date   Allergy     Asthma    Bronchitis    Class 3 severe obesity due to excess calories with serious comorbidity and body mass index (BMI) of 45.0 to 49.9 in adult [facility]) 12/15/2013   Diabetes mellitus without complication (HCC)    Dyslipidemia, goal LDL below 70 04/29/2023   GERD (gastroesophageal reflux disease)    Hypertension    Lumbar degenerative disc disease 06/04/2017   Obesity    Sleep apnea    uses  c pap    Vitamin D  deficiency 06/30/2017    Past Surgical History:  Procedure Laterality Date   DENTAL SURGERY     ECTOPIC PREGNANCY SURGERY  2006    Family History  Problem Relation Age of Onset   Hypertension Father    Asthma Father    Hypertension Sister    Hypertension Sister    Stroke Paternal Grandfather    Hypertension Paternal Grandfather    Colon cancer Neg Hx    Colon polyps Neg Hx    Esophageal cancer Neg Hx    Rectal cancer Neg Hx    Stomach cancer Neg Hx    Breast cancer Neg Hx     Social History   Socioeconomic History   Marital status: Married    Spouse name: Not on file   Number of children: 2   Years of education: Not on file   Highest education level: Not on file  Occupational History   Not on file  Tobacco Use   Smoking status: Never   Smokeless tobacco: Never  Vaping Use   Vaping status: Never Used  Substance and Sexual Activity   Alcohol use: Not Currently    Comment: rare   Drug use: No   Sexual activity: Yes    Birth control/protection: Post-menopausal    Comment: >5 sexual partners  Other Topics Concern   Not on file  Social History Narrative   Lives with her husband   Social Drivers of Health  Financial Resource Strain: Medium Risk (05/29/2023)   Overall Financial Resource Strain (CARDIA)    Difficulty of Paying Living Expenses: Somewhat hard  Food Insecurity: Food Insecurity Present (12/19/2023)   Hunger Vital Sign    Worried About Running Out of Food in the Last Year: Sometimes true    Ran Out of Food in the Last Year: Sometimes true  Transportation Needs: No Transportation Needs (05/29/2023)   PRAPARE - Administrator, Civil Service (Medical): No    Lack of Transportation (Non-Medical): No  Physical Activity: Insufficiently Active (05/29/2023)   Exercise Vital Sign    Days of Exercise per Week: 7 days    Minutes of Exercise per Session: 20 min  Stress: No Stress Concern Present (05/29/2023)   [employer] of  Occupational Health - Occupational Stress Questionnaire    Feeling of Stress : Not at all  Social Connections: Moderately Integrated (05/29/2023)   Social Connection and Isolation Panel [NHANES]    Frequency of Communication with Friends and Family: More than three times a week    Frequency of Social Gatherings with Friends and Family: More than three times a week    Attends Religious Services: More than 4 times per year    Active Member of [employer] or Organizations: No    Attends Banker Meetings: Never    Marital Status: Married  Catering manager Violence: Not At Risk (12/18/2023)   Received from [facility]   HITS    Over the last 12 months how often did your partner physically hurt you?: Never    Over the last 12 months how often did your partner insult you or talk down to you?: Never    Over the last 12 months how often did your partner threaten you with physical harm?: Never    Over the last 12 months how often did your partner scream or curse at you?: Never    Outpatient Medications Prior to Visit  Medication Sig Dispense Refill   albuterol  (PROVENTIL ) (2.5 MG/3ML) 0.083% nebulizer solution Take 3 mLs (2.5 mg total) by nebulization every 6 (six) hours as needed for wheezing. 75 mL 1   albuterol  (VENTOLIN  HFA) 108 (90 Base) MCG/ACT inhaler Inhale 1-2 puffs into the lungs every 6 (six) hours as needed for wheezing or shortness of breath. 1 each 1   atenolol  (TENORMIN ) 50 MG tablet Take 0.5 tablets (25 mg total) by mouth daily. 90 tablet 0   Blood Glucose Monitoring Suppl (ONETOUCH VERIO FLEX SYSTEM) DEVI 1 each by Does not apply route as directed. To check blood sugars 1 each 0   budesonide -formoterol  (SYMBICORT ) 160-4.5 MCG/ACT inhaler Inhale 2 puffs into the lungs in the morning and at bedtime. 10.2 each 6   clobetasol  ointment (TEMOVATE ) 0.05 % Apply 1 Application topically 2 (two) times daily. Apply a small amount topically twice daily for up to 2 weeks 60 g 0   ezetimibe   (ZETIA ) 10 MG tablet TAKE 1 TABLET BY MOUTH EVERY DAY 90 tablet 0   fluticasone  (FLONASE ) 50 MCG/ACT nasal spray SPRAY 2 SPRAYS INTO EACH NOSTRIL EVERY DAY 48 mL 2   glucose blood (ONETOUCH VERIO) test strip Use as instructed to check blood sugars twice daily and once additional if elevated. 300 strip 1   hydrochlorothiazide  (HYDRODIURIL ) 12.5 MG tablet Take 1 tablet (12.5 mg total) by mouth daily. 90 tablet 1   ibuprofen  (ADVIL ) 800 MG tablet Take 1 tablet (800 mg total) by mouth every 8 (eight) hours as needed.  30 tablet 1   Magnesium 100 MG CAPS Take by mouth.     metFORMIN  (GLUCOPHAGE ) 500 MG tablet Take 2 tablets (1,000 mg total) by mouth daily with breakfast. 60 tablet 3   montelukast  (SINGULAIR ) 10 MG tablet TAKE 1 TABLET BY MOUTH EVERYDAY AT BEDTIME 30 tablet 3   Nebulizers (COMPRESSOR/NEBULIZER) MISC As directed 1 each 0   OneTouch Delica Lancets 33G MISC 1 each by Does not apply route in the morning and at bedtime. 200 each 4   gabapentin (NEURONTIN) 600 MG tablet Take 600 mg by mouth at bedtime.     erythromycin ophthalmic ointment Place into the left eye in the morning and at bedtime. (Patient not taking: Reported on 02/06/2024)     fluvastatin  (LESCOL ) 40 MG capsule TAKE 1 CAPSULE BY MOUTH AT BEDTIME. (Patient not taking: Reported on 02/06/2024) 90 capsule 1   methocarbamol  (ROBAXIN ) 500 MG tablet Take 1 tablet (500 mg total) by mouth 2 (two) times daily. (Patient not taking: Reported on 02/06/2024) 20 tablet 0   olopatadine  (PATANOL) 0.1 % ophthalmic solution Place 1 drop into the right eye 2 (two) times daily. (Patient not taking: Reported on 02/06/2024) 5 mL 0   polyethylene glycol powder (GLYCOLAX /MIRALAX ) 17 GM/SCOOP powder DISSOLVE 17 GRAMS IN 8 OZ OF FLUID LIQUID DRINK DAILY AS DIRECTED (Patient not taking: Reported on 02/06/2024) 238 g 0   HYDROcodone  bit-homatropine (HYCODAN) 5-1.5 MG/5ML syrup Take 5 mLs by mouth every 6 (six) hours as needed for cough. (Patient not taking: Reported  on 02/06/2024) 90 mL 0   No facility-administered medications prior to visit.    Allergies  Allergen Reactions   Rosuvastatin  Other (See Comments)   Seasonal Ic [Octacosanol]     ROS Review of Systems  Constitutional:  Negative for appetite change, chills, fatigue and fever.  HENT:  Negative for congestion.   Respiratory:  Negative for cough, shortness of breath and wheezing.   Cardiovascular:  Negative for chest pain, palpitations and leg swelling.  Gastrointestinal:  Negative for abdominal pain, constipation, nausea and vomiting.  Genitourinary:  Negative for difficulty urinating, dysuria, flank pain and frequency.  Musculoskeletal:  Positive for arthralgias. Negative for back pain and myalgias.  Skin:  Negative for color change, pallor, rash and wound.  Neurological:  Negative for dizziness, facial asymmetry, weakness, numbness and headaches.  Psychiatric/Behavioral:  Negative for behavioral problems, confusion, self-injury and suicidal ideas.       Objective:    Physical Exam Vitals and nursing note reviewed.  Constitutional:      General: She is not in acute distress.    Appearance: Normal appearance. She is obese. She is not ill-appearing, toxic-appearing or diaphoretic.  HENT:     Mouth/Throat:     Mouth: Mucous membranes are moist.     Pharynx: Oropharynx is clear. No oropharyngeal exudate or posterior oropharyngeal erythema.  Eyes:     General: No scleral icterus.       Right eye: No discharge.        Left eye: No discharge.     Extraocular Movements: Extraocular movements intact.     Conjunctiva/sclera: Conjunctivae normal.  Cardiovascular:     Rate and Rhythm: Normal rate and regular rhythm.     Pulses: Normal pulses.     Heart sounds: Normal heart sounds. No murmur heard.    No friction rub. No gallop.  Pulmonary:     Effort: Pulmonary effort is normal. No respiratory distress.     Breath sounds: Normal breath  sounds. No stridor. No wheezing, rhonchi or  rales.  Chest:     Chest wall: No tenderness.  Abdominal:     General: There is no distension.     Palpations: Abdomen is soft.     Tenderness: There is no abdominal tenderness. There is no right CVA tenderness, left CVA tenderness or guarding.  Musculoskeletal:        General: No swelling, tenderness, deformity or signs of injury.     Right lower leg: No edema.     Left lower leg: No edema.     Comments: Uses a can for ambulation, sitting in a chair comfortably   Skin:    General: Skin is warm and dry.     Capillary Refill: Capillary refill takes 2 to 3 seconds.     Coloration: Skin is not jaundiced or pale.     Findings: No bruising, erythema or lesion.  Neurological:     Mental Status: She is alert and oriented to person, place, and time.     Motor: No weakness.     Gait: Gait abnormal.  Psychiatric:        Mood and Affect: Mood normal.        Behavior: Behavior normal.        Thought Content: Thought content normal.        Judgment: Judgment normal.     BP (!) 135/51   Pulse (!) 57   Wt 263 lb (119.3 kg)   LMP  (LMP Unknown)   SpO2 97%   BMI 43.77 kg/m  Wt Readings from Last 3 Encounters:  02/06/24 263 lb (119.3 kg)  12/23/23 262 lb (118.8 kg)  12/22/23 262 lb 14.4 oz (119.3 kg)    Lab Results  Component Value Date   TSH 1.42 01/31/2023   Lab Results  Component Value Date   WBC 5.9 01/31/2023   HGB 14.9 01/31/2023   HCT 45 01/31/2023   MCV 94.3 01/28/2023   PLT 254 01/31/2023   Lab Results  Component Value Date   NA 140 11/14/2023   K 4.4 11/14/2023   CO2 22 11/14/2023   GLUCOSE 94 11/14/2023   BUN 14 11/14/2023   CREATININE 0.72 11/14/2023   BILITOT 0.2 07/18/2023   ALKPHOS 62 07/18/2023   AST 22 07/18/2023   ALT 15 07/18/2023   PROT 6.9 07/18/2023   ALBUMIN 4.2 07/18/2023   CALCIUM  10.1 11/14/2023   ANIONGAP 14 01/28/2023   EGFR 96 11/14/2023   GFR 116.24 09/24/2017   Lab Results  Component Value Date   CHOL 200 (H) 11/14/2023   Lab  Results  Component Value Date   HDL 57 11/14/2023   Lab Results  Component Value Date   LDLCALC 122 (H) 11/14/2023   Lab Results  Component Value Date   TRIG 117 11/14/2023   Lab Results  Component Value Date   CHOLHDL 3.5 11/14/2023   Lab Results  Component Value Date   HGBA1C 6.2 (A) 11/14/2023      Assessment & Plan:   Problem List Items Addressed This Visit       Cardiovascular and Mediastinum   HTN (hypertension)   Systolic blood pressure is low but has been previously normal Continue atenolol  25 mg daily hydrochlorothiazide  12.5 mg daily Will reevaluate blood pressure at next visit     02/06/2024   11:47 AM 02/06/2024   10:33 AM 12/23/2023    9:26 AM 12/22/2023   10:27 AM 12/12/2023   11:41 AM 11/14/2023  11:35 AM 11/04/2023   10:28 AM  BP/Weight  Systolic BP 135 129 127 130  128 141  Diastolic BP 51 48 62 82  57 77  Wt. (Lbs)  263 262 262.9 255 255   BMI  43.77 kg/m2 43.6 kg/m2 43.75 kg/m2 42.43 kg/m2 42.43 kg/m2            PAD (peripheral artery disease) (HCC)   Patient would like to be referred to vascular for further evaluation She reports lower extremity pain but I think her lumbar degenerative disease could be contributing to her pain      Relevant Orders   Ambulatory referral to Vascular Surgery     Endocrine   Type 2 diabetes mellitus with hyperglycemia, without long-term current use of insulin (HCC)   Lab Results  Component Value Date   HGBA1C 6.2 (A) 11/14/2023  Continue metformin  1000 mg daily Continue low-carb diet We will recheck labs at next visit        Musculoskeletal and Integument   Lumbar degenerative disc disease - Primary   Continue ibuprofen  800 mg 3 times daily as needed, gabapentin 600 mg at bedtime, encouraged to take the medication daily instead of as needed, she was on prescription for Vicodin but not anymore She would like to continue ibuprofen  and gabapentin for now. Also not interested in referral to physical  therapy at this time      Relevant Medications   gabapentin (NEURONTIN) 600 MG tablet     Other   Dyslipidemia, goal LDL below 70   Due to her recent diagnosis of PAD LDL goal is less than 55 Encouraged to start fluvastatin  as ordered, continue Zetia  10 mg daily Will check labs at next visit       Meds ordered this encounter  Medications   gabapentin (NEURONTIN) 600 MG tablet    Sig: Take 1 tablet (600 mg total) by mouth at bedtime.    Dispense:  90 tablet    Refill:  1    Follow-up: No follow-ups on file.    Bernadean Saling R Alanea Woolridge, FNP

## 2024-02-06 NOTE — Patient Instructions (Signed)
 Please call fasting next appointment   1. Degeneration of intervertebral disc of lumbar region with lower extremity pain (Primary)  - gabapentin (NEURONTIN) 600 MG tablet; Take 1 tablet (600 mg total) by mouth at bedtime.  Dispense: 90 tablet; Refill: 1  2. PAD (peripheral artery disease) (HCC)  - Ambulatory referral to Vascular Surgery    It is important that you exercise regularly at least 30 minutes 5 times a week as tolerated  Think about what you will eat, plan ahead. Choose " clean, green, fresh or frozen" over canned, processed or packaged foods which are more sugary, salty and fatty. 70 to 75% of food eaten should be vegetables and fruit. Three meals at set times with snacks allowed between meals, but they must be fruit or vegetables. Aim to eat over a 12 hour period , example 7 am to 7 pm, and STOP after  your last meal of the day. Drink water,generally about 64 ounces per day, no other drink is as healthy. Fruit juice is best enjoyed in a healthy way, by EATING the fruit.  Thanks for choosing [facility] we consider it a privelige to serve you.

## 2024-02-06 NOTE — Assessment & Plan Note (Signed)
 Continue ibuprofen  800 mg 3 times daily as needed, gabapentin 600 mg at bedtime, encouraged to take the medication daily instead of as needed, she was on prescription for Vicodin but not anymore She would like to continue ibuprofen  and gabapentin for now. Also not interested in referral to physical therapy at this time

## 2024-02-06 NOTE — Assessment & Plan Note (Signed)
 Patient would like to be referred to vascular for further evaluation She reports lower extremity pain but I think her lumbar degenerative disease could be contributing to her pain

## 2024-02-06 NOTE — Assessment & Plan Note (Signed)
 Systolic blood pressure is low but has been previously normal Continue atenolol  25 mg daily hydrochlorothiazide  12.5 mg daily Will reevaluate blood pressure at next visit     02/06/2024   11:47 AM 02/06/2024   10:33 AM 12/23/2023    9:26 AM 12/22/2023   10:27 AM 12/12/2023   11:41 AM 11/14/2023   11:35 AM 11/04/2023   10:28 AM  BP/Weight  Systolic BP 135 129 127 130  128 141  Diastolic BP 51 48 62 82  57 77  Wt. (Lbs)  263 262 262.9 255 255   BMI  43.77 kg/m2 43.6 kg/m2 43.75 kg/m2 42.43 kg/m2 42.43 kg/m2

## 2024-02-06 NOTE — Assessment & Plan Note (Signed)
 Lab Results  Component Value Date   HGBA1C 6.2 (A) 11/14/2023  Continue metformin  1000 mg daily Continue low-carb diet We will recheck labs at next visit

## 2024-02-06 NOTE — Progress Notes (Signed)
 See phone message , closed per admin protocol

## 2024-02-06 NOTE — Assessment & Plan Note (Signed)
 Due to her recent diagnosis of PAD LDL goal is less than 55 Encouraged to start fluvastatin  as ordered, continue Zetia  10 mg daily Will check labs at next visit

## 2024-02-10 ENCOUNTER — Other Ambulatory Visit: Payer: Self-pay

## 2024-02-10 ENCOUNTER — Other Ambulatory Visit: Payer: Self-pay | Admitting: Nurse Practitioner

## 2024-02-10 MED ORDER — ACCU-CHEK SOFTCLIX LANCETS MISC: 12 refills | Status: DC

## 2024-02-11 ENCOUNTER — Other Ambulatory Visit: Payer: Self-pay | Admitting: Nurse Practitioner

## 2024-02-11 ENCOUNTER — Other Ambulatory Visit: Payer: Self-pay

## 2024-02-11 DIAGNOSIS — E1165 Type 2 diabetes mellitus with hyperglycemia: Secondary | ICD-10-CM

## 2024-02-11 DIAGNOSIS — I70213 Atherosclerosis of native arteries of extremities with intermittent claudication, bilateral legs: Secondary | ICD-10-CM

## 2024-02-11 NOTE — Telephone Encounter (Signed)
 Copied from CRM [id number]. Topic: Clinical - Medication Refill >> Feb 11, 2024 11:16 AM Phil Braun wrote: Most Recent Primary Care Visit:  Provider: Jacquetta Mattocks  Department: [facility]  Visit Type: OFFICE VISIT  Date: 02/06/2024  Medication: metFORMIN  (GLUCOPHAGE ) 500 MG tablet (metformin  500 hcl mg xr) 2x a day  Has the patient contacted their pharmacy? Yes   Is this the correct pharmacy for this prescription? Yes  This is the patient's preferred pharmacy:   [facility] [address], [place] - [address]  [address] Phone: [phone] Fax: [phone]   Has the prescription been filled recently? Yes By former physician but has est care with dr paseda Is the patient out of the medication? Yes  Has the patient been seen for an appointment in the last year OR does the patient have an upcoming appointment? Yes  Can we respond through MyChart? Yes  Agent: Please be advised that Rx refills may take up to 3 business days. We ask that you follow-up with your pharmacy.

## 2024-02-12 ENCOUNTER — Encounter: Payer: Self-pay | Admitting: Vascular Surgery

## 2024-02-12 ENCOUNTER — Ambulatory Visit: Attending: Vascular Surgery | Admitting: Vascular Surgery

## 2024-02-12 ENCOUNTER — Telehealth: Payer: Self-pay

## 2024-02-12 ENCOUNTER — Ambulatory Visit (HOSPITAL_COMMUNITY)
Admission: RE | Admit: 2024-02-12 | Discharge: 2024-02-12 | Disposition: A | Discharge status: Home / Self Care | Source: Ambulatory Visit | Attending: Vascular Surgery | Admitting: Vascular Surgery

## 2024-02-12 VITALS — BP 118/69 | HR 68 | Temp 98.5°F | Resp 22 | Ht 65.0 in | Wt 262.4 lb

## 2024-02-12 DIAGNOSIS — I70213 Atherosclerosis of native arteries of extremities with intermittent claudication, bilateral legs: Secondary | ICD-10-CM | POA: Diagnosis not present

## 2024-02-12 DIAGNOSIS — M48062 Spinal stenosis, lumbar region with neurogenic claudication: Secondary | ICD-10-CM | POA: Diagnosis not present

## 2024-02-12 LAB — VAS US ABI WITH/WO TBI
Left ABI: 1
Right ABI: 0.98

## 2024-02-12 MED ORDER — METFORMIN HCL 500 MG PO TABS: 1000.0000 mg | ORAL_TABLET | Freq: Every day | ORAL | 3 refills | Status: DC
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
## 2024-02-12 NOTE — Telephone Encounter (Signed)
 Copied from CRM [id number]. Topic: Clinical - Prescription Issue >> Feb 12, 2024  3:09 PM Rennis Case wrote: Reason for CRM: Patient states the wrong Rx was sent in for her today. Patient states the Rx was supposed to be for Metformin  ER 500mg .   Patient requesting correct Rx be sent into pharmacy.  Please advise because this is not what is in there chart. KH

## 2024-02-13 ENCOUNTER — Other Ambulatory Visit: Payer: Self-pay | Admitting: Nurse Practitioner

## 2024-02-13 DIAGNOSIS — E1165 Type 2 diabetes mellitus with hyperglycemia: Secondary | ICD-10-CM

## 2024-02-13 MED ORDER — METFORMIN HCL ER 500 MG PO TB24: 1000.0000 mg | ORAL_TABLET | Freq: Every day | ORAL | 1 refills | Status: DC

## 2024-02-13 NOTE — Telephone Encounter (Signed)
 Pt called in says the metformin  should be the ER one.

## 2024-02-25 DIAGNOSIS — E119 Type 2 diabetes mellitus without complications: Secondary | ICD-10-CM | POA: Diagnosis not present

## 2024-02-25 DIAGNOSIS — H524 Presbyopia: Secondary | ICD-10-CM | POA: Diagnosis not present

## 2024-02-25 LAB — HM DIABETES EYE EXAM

## 2024-02-26 ENCOUNTER — Encounter: Payer: Medicare HMO | Attending: Nurse Practitioner | Admitting: Dietician

## 2024-02-26 ENCOUNTER — Encounter: Payer: Self-pay | Admitting: Dietician

## 2024-02-26 DIAGNOSIS — E119 Type 2 diabetes mellitus without complications: Secondary | ICD-10-CM | POA: Diagnosis not present

## 2024-02-26 NOTE — Progress Notes (Signed)
  Appointment start:  1130 Appointment End:  1200 Patient is here today alone.  She was last seen by this RD on 08/25/2023. Eye appointment yesterday which showed no damage. Occasional low carb ice cream.   Patient attended Diabetes Core Classes 1-3 between 05/20/2023 and 06/03/2023 at [facility]. The purpose of the meeting today is to review information learned during those classes as well as review patient application and goals.  Weight: 65" 265 lbs 02/26/2024.  She states she gained with Holidays' and eating out. 250 lbs fall 2024 305 lbs Spring 2024  Highest adult weight.  What are one or two positive things that you are doing right now to manage your diabetes?  Checking her blood glucose, balanced diet - avoids most sugar Avoids fried food, eating at home more often  What is the hardest part about your diabetes right now, causing you the most concern, or is the most worrisome to you about your diabetes?  Feels good about it now  What questions do you have today?  Sometimes I get scared when my blood glucose will get too low overnight - discussed that there are not risks for that currently on her current medications  Have you participated in any diabetes support group?  none  History:  Type 2 Diabetes, HLD, HTN, GERD, OSA- c-pap A1C:  6.2% 11/14/2023, 6% 07/18/2023 decreased from 7.5% 04/29/2023, 14.2% 01/31/2023 Medications include:  Metformin  XR Sleep:  good unless in pain Weight:  254 lbs 08/15/2023 decreased from 300 lbs prior to the diabetes diagnosis in 01/2023 Blood Glucose:  Fasting:   86 (86-105)   2 hours after starting a meal:  71-120 q HS.  Have you had any low blood sugar readings in the past month?  none  Social History:  Patient lives with her husband.  She is on disability.  She does most of the cooking and share shopping.  Used to work as a beautician and in an Agricultural consultant. Exercise:  walks with a cane. Husband walks with her  occasionally.  24 hour diet recall: Breakfast: 3 pieces air fried drummets, 2 Slices [employer] bread OR PB crackers Snack:  occasional fruit Lunch: [employer] - corndog, onion rings, BBQ sandwich, slaw OR salad without protein Snack:  trail mix  Dinner:  [employer] pasta, air fried chicken, green beans, carrots Snack:  none - rare fruit Beverages:  water, zero soda, sugar free, unsweetened tea   Specific focus but not limited to the following: Review of blood glucose monitoring and interpretation including the recommended target ranges and Hgb A1c.  Review of carbohydrate counting, importance of regularly scheduled meals/snacks, and meal planning to improve quality of diet. Review of the effects of physical activity on glucose levels and long-term glucose control.  Recommended goal of 150 minutes of physical activity/week. Review of short-term complications: hypo-glycemia (causes, symptoms, and treatment options) Review of prevention, detection, and treatment of long-term complications.  Discussion of the role of prolonged elevated glucose levels on body systems.  Continuing Goals: Find ways to be more active.  Aim for 30 minutes most days.  Walk Away the Pounds  [employer] job on changes made! Continue to eat at home most often. Continue to make 1/2 your plate vegetables most often.  Future Follow up:  6 months

## 2024-02-26 NOTE — Patient Instructions (Addendum)
 Find ways to be more active.  Aim for 30 minutes most days.  Walk Away the Pounds  [name] on [employer] job on changes made! Continue to eat at home most often. Continue to make 1/2 your plate vegetables most often.

## 2024-02-27 ENCOUNTER — Other Ambulatory Visit: Payer: Self-pay | Admitting: Nurse Practitioner

## 2024-02-27 DIAGNOSIS — Z6841 Body Mass Index (BMI) 40.0 and over, adult: Secondary | ICD-10-CM

## 2024-02-27 DIAGNOSIS — E1165 Type 2 diabetes mellitus with hyperglycemia: Secondary | ICD-10-CM

## 2024-03-19 ENCOUNTER — Encounter: Payer: Self-pay | Admitting: Nurse Practitioner

## 2024-03-19 ENCOUNTER — Ambulatory Visit (INDEPENDENT_AMBULATORY_CARE_PROVIDER_SITE_OTHER): Payer: Self-pay | Admitting: Nurse Practitioner

## 2024-03-19 VITALS — BP 126/66 | HR 72 | Temp 97.6°F | Wt 264.0 lb

## 2024-03-19 DIAGNOSIS — G8929 Other chronic pain: Secondary | ICD-10-CM | POA: Diagnosis not present

## 2024-03-19 DIAGNOSIS — M25561 Pain in right knee: Secondary | ICD-10-CM

## 2024-03-19 DIAGNOSIS — E1165 Type 2 diabetes mellitus with hyperglycemia: Secondary | ICD-10-CM | POA: Diagnosis not present

## 2024-03-19 DIAGNOSIS — E785 Hyperlipidemia, unspecified: Secondary | ICD-10-CM

## 2024-03-19 DIAGNOSIS — I10 Essential (primary) hypertension: Secondary | ICD-10-CM | POA: Diagnosis not present

## 2024-03-19 DIAGNOSIS — N898 Other specified noninflammatory disorders of vagina: Secondary | ICD-10-CM | POA: Insufficient documentation

## 2024-03-19 DIAGNOSIS — M25562 Pain in left knee: Secondary | ICD-10-CM

## 2024-03-19 DIAGNOSIS — M5136 Other intervertebral disc degeneration, lumbar region with discogenic back pain only: Secondary | ICD-10-CM

## 2024-03-19 LAB — POCT GLYCOSYLATED HEMOGLOBIN (HGB A1C): Hemoglobin A1C: 6 % — AB (ref 4.0–5.6)

## 2024-03-19 MED ORDER — METFORMIN HCL ER 500 MG PO TB24
500.0000 mg | ORAL_TABLET | Freq: Every day | ORAL | 1 refills | Status: DC
Start: 2024-03-19 — End: 2024-05-17

## 2024-03-19 MED ORDER — DICLOFENAC SODIUM 1 % EX GEL: 4.0000 g | Freq: Four times a day (QID) | CUTANEOUS | 0 refills | Status: AC

## 2024-03-19 NOTE — Assessment & Plan Note (Addendum)
 Lab Results  Component Value Date   CHOL 200 (H) 11/14/2023   HDL 57 11/14/2023   LDLCALC 122 (H) 11/14/2023   TRIG 117 11/14/2023   CHOLHDL 3.5 11/14/2023  She has stopped taking Zetia  takes fluvastatin  40 mg daily Checking lipid panel, LDL goal is less than 70 She has followed up with vascular specialist and ABI was normal Avoid fatty fried foods

## 2024-03-19 NOTE — Patient Instructions (Signed)
 Around 3 times per week, check your blood pressure 2 times per day. once in the morning and once in the evening. The readings should be at least one minute apart. Write down these values and bring them to your next nurse visit/appointment.  When you check your BP, make sure you have been doing something calm/relaxing 5 minutes prior to checking. Both feet should be flat on the floor and you should be sitting. Use your left arm and make sure it is in a relaxed position (on a table), and that the cuff is at the approximate level/height of your heart.  Blood pressure goal is less than 130/80        1. Type 2 diabetes mellitus with hyperglycemia, without long-term current use of insulin (HCC) (Primary)  - POCT glycosylated hemoglobin (Hb A1C) - Basic Metabolic Panel - Lipid panel  2. Dyslipidemia, goal LDL below 70   3. Acute pain of both knees  - diclofenac  Sodium (VOLTAREN  ARTHRITIS PAIN) 1 % GEL; Apply 4 g topically 4 (four) times daily.  Dispense: 100 g; Refill: 0  4. Vaginal odor  - NuSwab Vaginitis Plus (VG+)

## 2024-03-19 NOTE — Assessment & Plan Note (Signed)
 Lab Results  Component Value Date   HGBA1C 6.0 (A) 03/19/2024  Well-controlled on metformin  1000 mg daily, blood sugar log from home reviewed ,blood sugar has been well-controlled  She wants to reduce metformin  to 500 mg daily Start metformin  500 mg daily Patient counseled on low-carb diet CBG goals discussed Up-to-date with diabetic eye exam Follow-up in 4 months

## 2024-03-19 NOTE — Assessment & Plan Note (Signed)
 Uses Voltaren  gel as needed, needs a refill - diclofenac  Sodium (VOLTAREN  ARTHRITIS PAIN) 1 % GEL; Apply 4 g topically 4 (four) times daily.  Dispense: 100 g; Refill: 0

## 2024-03-19 NOTE — Assessment & Plan Note (Signed)
 Continue gabapentin  600 mg 3 times daily, ibuprofen  800 mg 3 times daily as needed

## 2024-03-19 NOTE — Progress Notes (Signed)
 Established Patient Office Visit  Subjective:  Patient ID: [name], female    DOB: 1966-10-07  Age: 58 y.o. MRN: [id number]  CC:  Chief Complaint  Patient presents with   Diabetes    Follow up   Hyperlipidemia    Fasting     HPI [name] is a 58 y.o. female   has a past medical history of Allergy , Asthma, Bronchitis, Class 3 severe obesity due to excess calories with serious comorbidity and body mass index (BMI) of 45.0 to 49.9 in adult (12/15/2013), Diabetes mellitus without complication (HCC), Dyslipidemia, goal LDL below 70 (04/29/2023), GERD (gastroesophageal reflux disease), Hypertension, Lumbar degenerative disc disease (06/04/2017), Obesity, Sleep apnea, and Vitamin D  deficiency (06/30/2017).  Patient presents for follow-up for her chronic medical conditions Please see assessment and plan section for full HPI and plan      Past Medical History:  Diagnosis Date   Allergy     Asthma    Bronchitis    Class 3 severe obesity due to excess calories with serious comorbidity and body mass index (BMI) of 45.0 to 49.9 in adult 12/15/2013   Diabetes mellitus without complication (HCC)    Dyslipidemia, goal LDL below 70 04/29/2023   GERD (gastroesophageal reflux disease)    Hypertension    Lumbar degenerative disc disease 06/04/2017   Obesity    Sleep apnea    uses c pap    Vitamin D  deficiency 06/30/2017    Past Surgical History:  Procedure Laterality Date   DENTAL SURGERY     ECTOPIC PREGNANCY SURGERY  2006    Family History  Problem Relation Age of Onset   Hypertension Father    Asthma Father    Hypertension Sister    Hypertension Sister    Stroke Paternal Grandfather    Hypertension Paternal Grandfather    Colon cancer Neg Hx    Colon polyps Neg Hx    Esophageal cancer Neg Hx    Rectal cancer Neg Hx    Stomach cancer Neg Hx    Breast cancer Neg Hx     Social History   Socioeconomic History   Marital status: Married    Spouse  name: Not on file   Number of children: 2   Years of education: Not on file   Highest education level: Not on file  Occupational History   Not on file  Tobacco Use   Smoking status: Never   Smokeless tobacco: Never  Vaping Use   Vaping status: Never Used  Substance and Sexual Activity   Alcohol use: Not Currently    Comment: rare   Drug use: No   Sexual activity: Yes    Birth control/protection: Post-menopausal    Comment: >5 sexual partners  Other Topics Concern   Not on file  Social History Narrative   Lives with her husband   Social Drivers of Health   Financial Resource Strain: Medium Risk (05/29/2023)   Overall Financial Resource Strain (CARDIA)    Difficulty of Paying Living Expenses: Somewhat hard  Food Insecurity: Food Insecurity Present (12/19/2023)   Hunger Vital Sign    Worried About Running Out of Food in the Last Year: Sometimes true    Ran Out of Food in the Last Year: Sometimes true  Transportation Needs: No Transportation Needs (05/29/2023)   PRAPARE - Administrator, Civil Service (Medical): No    Lack of Transportation (Non-Medical): No  Physical Activity: Insufficiently Active (05/29/2023)   Exercise Vital Sign  Days of Exercise per Week: 7 days    Minutes of Exercise per Session: 20 min  Stress: No Stress Concern Present (05/29/2023)   [employer] of Occupational Health - Occupational Stress Questionnaire    Feeling of Stress : Not at all  Social Connections: Moderately Integrated (05/29/2023)   Social Connection and Isolation Panel [NHANES]    Frequency of Communication with Friends and Family: More than three times a week    Frequency of Social Gatherings with Friends and Family: More than three times a week    Attends Religious Services: More than 4 times per year    Active Member of [employer] or Organizations: No    Attends Banker Meetings: Never    Marital Status: Married  Catering manager Violence: Not At Risk  (12/18/2023)   Received from [facility]   HITS    Over the last 12 months how often did your partner physically hurt you?: Never    Over the last 12 months how often did your partner insult you or talk down to you?: Never    Over the last 12 months how often did your partner threaten you with physical harm?: Never    Over the last 12 months how often did your partner scream or curse at you?: Never    Outpatient Medications Prior to Visit  Medication Sig Dispense Refill   albuterol  (PROVENTIL ) (2.5 MG/3ML) 0.083% nebulizer solution Take 3 mLs (2.5 mg total) by nebulization every 6 (six) hours as needed for wheezing. 75 mL 1   albuterol  (VENTOLIN  HFA) 108 (90 Base) MCG/ACT inhaler Inhale 1-2 puffs into the lungs every 6 (six) hours as needed for wheezing or shortness of breath. 1 each 1   Blood Glucose Monitoring Suppl (ONETOUCH VERIO FLEX SYSTEM) DEVI 1 each by Does not apply route as directed. To check blood sugars 1 each 0   budesonide -formoterol  (SYMBICORT ) 160-4.5 MCG/ACT inhaler Inhale 2 puffs into the lungs in the morning and at bedtime. 10.2 each 6   clobetasol  ointment (TEMOVATE ) 0.05 % Apply 1 Application topically 2 (two) times daily. Apply a small amount topically twice daily for up to 2 weeks 60 g 0   fluticasone  (FLONASE ) 50 MCG/ACT nasal spray SPRAY 2 SPRAYS INTO EACH NOSTRIL EVERY DAY 48 mL 2   fluvastatin  (LESCOL ) 40 MG capsule TAKE 1 CAPSULE BY MOUTH AT BEDTIME. 90 capsule 1   gabapentin  (NEURONTIN ) 600 MG tablet Take 1 tablet (600 mg total) by mouth at bedtime. 90 tablet 1   glucose blood (ONETOUCH VERIO) test strip Use as instructed to check blood sugars twice daily and once additional if elevated. 300 strip 1   hydrochlorothiazide  (HYDRODIURIL ) 12.5 MG tablet Take 1 tablet (12.5 mg total) by mouth daily. 90 tablet 1   ibuprofen  (ADVIL ) 800 MG tablet Take 1 tablet (800 mg total) by mouth every 8 (eight) hours as needed. 30 tablet 1   Magnesium 100 MG CAPS Take by mouth.      methocarbamol  (ROBAXIN ) 500 MG tablet Take 1 tablet (500 mg total) by mouth 2 (two) times daily. 20 tablet 0   montelukast  (SINGULAIR ) 10 MG tablet TAKE 1 TABLET BY MOUTH EVERYDAY AT BEDTIME 30 tablet 3   Nebulizers (COMPRESSOR/NEBULIZER) MISC As directed 1 each 0   atenolol  (TENORMIN ) 50 MG tablet Take 0.5 tablets (25 mg total) by mouth daily. 90 tablet 0   metFORMIN  (GLUCOPHAGE -XR) 500 MG 24 hr tablet Take 2 tablets (1,000 mg total) by mouth daily with breakfast. 180  tablet 1   erythromycin ophthalmic ointment Place into the left eye in the morning and at bedtime. (Patient not taking: Reported on 02/06/2024)     ezetimibe  (ZETIA ) 10 MG tablet TAKE 1 TABLET BY MOUTH EVERY DAY (Patient not taking: Reported on 03/19/2024) 90 tablet 0   olopatadine  (PATANOL) 0.1 % ophthalmic solution Place 1 drop into the right eye 2 (two) times daily. (Patient not taking: Reported on 02/06/2024) 5 mL 0   polyethylene glycol powder (GLYCOLAX /MIRALAX ) 17 GM/SCOOP powder DISSOLVE 17 GRAMS IN 8 OZ OF FLUID LIQUID DRINK DAILY AS DIRECTED (Patient not taking: Reported on 03/19/2024) 238 g 0   Accu-Chek Softclix Lancets lancets USE AS INSTRUCTED 100 each 12   No facility-administered medications prior to visit.    Allergies  Allergen Reactions   Rosuvastatin  Other (See Comments)   Seasonal Ic [Octacosanol]     ROS Review of Systems  Constitutional:  Negative for appetite change, chills, fatigue and fever.  HENT:  Negative for congestion, postnasal drip, rhinorrhea and sneezing.   Respiratory:  Negative for cough, shortness of breath and wheezing.   Cardiovascular:  Negative for chest pain, palpitations and leg swelling.  Gastrointestinal:  Negative for abdominal pain, constipation, nausea and vomiting.  Genitourinary:  Negative for difficulty urinating, dysuria, flank pain, genital sores and hematuria.  Musculoskeletal:  Positive for arthralgias. Negative for back pain, joint swelling and myalgias.  Skin:  Negative  for color change, pallor, rash and wound.  Neurological:  Negative for dizziness, facial asymmetry, weakness, numbness and headaches.  Psychiatric/Behavioral:  Negative for behavioral problems, confusion, self-injury and suicidal ideas.       Objective:     Physical Exam Vitals and nursing note reviewed.  Constitutional:      General: She is not in acute distress.    Appearance: Normal appearance. She is obese. She is not ill-appearing, toxic-appearing or diaphoretic.  Eyes:     General: No scleral icterus.       Right eye: No discharge.        Left eye: No discharge.     Extraocular Movements: Extraocular movements intact.     Conjunctiva/sclera: Conjunctivae normal.  Cardiovascular:     Rate and Rhythm: Normal rate and regular rhythm.     Pulses: Normal pulses.     Heart sounds: Normal heart sounds. No murmur heard.    No friction rub. No gallop.  Pulmonary:     Effort: Pulmonary effort is normal. No respiratory distress.     Breath sounds: Normal breath sounds. No stridor. No wheezing, rhonchi or rales.  Chest:     Chest wall: No tenderness.  Abdominal:     General: There is no distension.     Palpations: Abdomen is soft.     Tenderness: There is no abdominal tenderness. There is no right CVA tenderness, left CVA tenderness or guarding.  Musculoskeletal:        General: No tenderness or signs of injury.     Right lower leg: No edema.     Left lower leg: No edema.  Skin:    General: Skin is warm and dry.     Capillary Refill: Capillary refill takes less than 2 seconds.     Coloration: Skin is not jaundiced or pale.     Findings: No bruising, erythema or lesion.  Neurological:     Mental Status: She is alert and oriented to person, place, and time.     Motor: No weakness.     Gait:  Gait abnormal.     Comments: Uses a cane for ambulation  Psychiatric:        Mood and Affect: Mood normal.        Behavior: Behavior normal.        Thought Content: Thought content  normal.        Judgment: Judgment normal.     BP 126/66   Pulse 72   Temp 97.6 F (36.4 C)   Wt 264 lb (119.7 kg)   LMP  (LMP Unknown)   SpO2 97%   BMI 43.93 kg/m  Wt Readings from Last 3 Encounters:  03/19/24 264 lb (119.7 kg)  02/12/24 262 lb 6.4 oz (119 kg)  02/06/24 263 lb (119.3 kg)    Lab Results  Component Value Date   TSH 1.42 01/31/2023   Lab Results  Component Value Date   WBC 5.9 01/31/2023   HGB 14.9 01/31/2023   HCT 45 01/31/2023   MCV 94.3 01/28/2023   PLT 254 01/31/2023   Lab Results  Component Value Date   NA 140 11/14/2023   K 4.4 11/14/2023   CO2 22 11/14/2023   GLUCOSE 94 11/14/2023   BUN 14 11/14/2023   CREATININE 0.72 11/14/2023   BILITOT 0.2 07/18/2023   ALKPHOS 62 07/18/2023   AST 22 07/18/2023   ALT 15 07/18/2023   PROT 6.9 07/18/2023   ALBUMIN 4.2 07/18/2023   CALCIUM  10.1 11/14/2023   ANIONGAP 14 01/28/2023   EGFR 96 11/14/2023   GFR 116.24 09/24/2017   Lab Results  Component Value Date   CHOL 200 (H) 11/14/2023   Lab Results  Component Value Date   HDL 57 11/14/2023   Lab Results  Component Value Date   LDLCALC 122 (H) 11/14/2023   Lab Results  Component Value Date   TRIG 117 11/14/2023   Lab Results  Component Value Date   CHOLHDL 3.5 11/14/2023   Lab Results  Component Value Date   HGBA1C 6.0 (A) 03/19/2024      Assessment & Plan:   Problem List Items Addressed This Visit       Cardiovascular and Mediastinum   HTN (hypertension)   Controlled on hydrochlorothiazide  12.5 mg daily, has been taking atenolol  25 mg every other day, she wants to come off atenolol  Stop atenolol  and continue hydrochlorothiazide  12.5 mg daily Encouraged to report blood pressure readings consistently greater than 130/80 DASH diet and commitment to daily physical activity for a minimum of 30 minutes discussed and encouraged, as a part of hypertension management.      03/19/2024   11:10 AM 02/12/2024   10:40 AM 02/06/2024   11:47  AM 02/06/2024   10:33 AM 12/23/2023    9:26 AM 12/22/2023   10:27 AM 12/12/2023   11:41 AM  BP/Weight  Systolic BP 126 118 135 129 127 130   Diastolic BP 66 69 51 48 62 82   Wt. (Lbs) 264 262.4  263 262 262.9 255  BMI 43.93 kg/m2 43.67 kg/m2  43.77 kg/m2 43.6 kg/m2 43.75 kg/m2 42.43 kg/m2             Endocrine   Type 2 diabetes mellitus with hyperglycemia, without long-term current use of insulin (HCC) - Primary   Lab Results  Component Value Date   HGBA1C 6.0 (A) 03/19/2024  Well-controlled on metformin  1000 mg daily, blood sugar log from home reviewed ,blood sugar has been well-controlled  She wants to reduce metformin  to 500 mg daily Start metformin  500 mg daily  Patient counseled on low-carb diet CBG goals discussed Up-to-date with diabetic eye exam Follow-up in 4 months      Relevant Medications   metFORMIN  (GLUCOPHAGE -XR) 500 MG 24 hr tablet   Other Relevant Orders   POCT glycosylated hemoglobin (Hb A1C) (Completed)   Basic Metabolic Panel   Lipid panel     Musculoskeletal and Integument   Lumbar degenerative disc disease   Continue gabapentin  600 mg 3 times daily, ibuprofen  800 mg 3 times daily as needed        Other   Dyslipidemia, goal LDL below 70   Lab Results  Component Value Date   CHOL 200 (H) 11/14/2023   HDL 57 11/14/2023   LDLCALC 122 (H) 11/14/2023   TRIG 117 11/14/2023   CHOLHDL 3.5 11/14/2023  She has stopped taking Zetia  takes fluvastatin  40 mg daily Checking lipid panel, LDL goal is less than 70 She has followed up with vascular specialist and ABI was normal Avoid fatty fried foods      Vaginal odor   Patient complains of vaginal odor, itching, no abnormal discharge  - NuSwab Vaginitis Plus (VG+)        Relevant Orders   NuSwab Vaginitis Plus (VG+)   Chronic pain of both knees   Uses Voltaren  gel as needed, needs a refill - diclofenac  Sodium (VOLTAREN  ARTHRITIS PAIN) 1 % GEL; Apply 4 g topically 4 (four) times daily.  Dispense:  100 g; Refill: 0         Relevant Medications   diclofenac  Sodium (VOLTAREN  ARTHRITIS PAIN) 1 % GEL    Meds ordered this encounter  Medications   diclofenac  Sodium (VOLTAREN  ARTHRITIS PAIN) 1 % GEL    Sig: Apply 4 g topically 4 (four) times daily.    Dispense:  100 g    Refill:  0   metFORMIN  (GLUCOPHAGE -XR) 500 MG 24 hr tablet    Sig: Take 1 tablet (500 mg total) by mouth daily with breakfast.    Dispense:  180 tablet    Refill:  1    Follow-up: Return in about 4 months (around 07/19/2024) for CPE.    Idrissa Beville R Rebeca Valdivia, FNP

## 2024-03-19 NOTE — Assessment & Plan Note (Signed)
 Patient complains of vaginal odor, itching, no abnormal discharge  - NuSwab Vaginitis Plus (VG+)

## 2024-03-19 NOTE — Assessment & Plan Note (Signed)
 Controlled on hydrochlorothiazide  12.5 mg daily, has been taking atenolol  25 mg every other day, she wants to come off atenolol  Stop atenolol  and continue hydrochlorothiazide  12.5 mg daily Encouraged to report blood pressure readings consistently greater than 130/80 DASH diet and commitment to daily physical activity for a minimum of 30 minutes discussed and encouraged, as a part of hypertension management.      03/19/2024   11:10 AM 02/12/2024   10:40 AM 02/06/2024   11:47 AM 02/06/2024   10:33 AM 12/23/2023    9:26 AM 12/22/2023   10:27 AM 12/12/2023   11:41 AM  BP/Weight  Systolic BP 126 118 135 129 127 130   Diastolic BP 66 69 51 48 62 82   Wt. (Lbs) 264 262.4  263 262 262.9 255  BMI 43.93 kg/m2 43.67 kg/m2  43.77 kg/m2 43.6 kg/m2 43.75 kg/m2 42.43 kg/m2

## 2024-03-20 LAB — BASIC METABOLIC PANEL WITH GFR
BUN/Creatinine Ratio: 20 (ref 9–23)
BUN: 14 mg/dL (ref 6–24)
CO2: 24 mmol/L (ref 20–29)
Calcium: 10 mg/dL (ref 8.7–10.2)
Chloride: 100 mmol/L (ref 96–106)
Creatinine, Ser: 0.7 mg/dL (ref 0.57–1.00)
Glucose: 80 mg/dL (ref 70–99)
Potassium: 4.3 mmol/L (ref 3.5–5.2)
Sodium: 142 mmol/L (ref 134–144)
eGFR: 100 mL/min/{1.73_m2} (ref >59)

## 2024-03-20 LAB — LIPID PANEL
Chol/HDL Ratio: 2.9 ratio (ref 0.0–4.4)
Cholesterol, Total: 174 mg/dL (ref 100–199)
HDL: 59 mg/dL (ref >39)
LDL Chol Calc (NIH): 99 mg/dL (ref 0–99)
Triglycerides: 89 mg/dL (ref 0–149)
VLDL Cholesterol Cal: 16 mg/dL (ref 5–40)

## 2024-03-22 ENCOUNTER — Ambulatory Visit: Payer: Self-pay | Admitting: Nurse Practitioner

## 2024-03-22 ENCOUNTER — Other Ambulatory Visit: Payer: Self-pay | Admitting: Nurse Practitioner

## 2024-03-22 DIAGNOSIS — B3731 Acute candidiasis of vulva and vagina: Secondary | ICD-10-CM

## 2024-03-22 DIAGNOSIS — E1165 Type 2 diabetes mellitus with hyperglycemia: Secondary | ICD-10-CM

## 2024-03-22 MED ORDER — FLUVASTATIN SODIUM ER 80 MG PO TB24: 80.0000 mg | ORAL_TABLET | Freq: Every day | ORAL | 1 refills | Status: DC

## 2024-03-24 LAB — NUSWAB VAGINITIS PLUS (VG+): Trich vag by NAA: NEGATIVE

## 2024-03-24 MED ORDER — FLUCONAZOLE 150 MG PO TABS: 150.0000 mg | ORAL_TABLET | Freq: Once | ORAL | 0 refills | Status: AC

## 2024-03-31 ENCOUNTER — Other Ambulatory Visit (HOSPITAL_BASED_OUTPATIENT_CLINIC_OR_DEPARTMENT_OTHER): Payer: Self-pay

## 2024-03-31 ENCOUNTER — Ambulatory Visit (HOSPITAL_BASED_OUTPATIENT_CLINIC_OR_DEPARTMENT_OTHER): Payer: Self-pay | Admitting: Pulmonary Disease

## 2024-03-31 ENCOUNTER — Other Ambulatory Visit (HOSPITAL_BASED_OUTPATIENT_CLINIC_OR_DEPARTMENT_OTHER): Payer: Self-pay | Admitting: Pulmonary Disease

## 2024-03-31 MED ORDER — MONTELUKAST SODIUM 10 MG PO TABS: 10.0000 mg | ORAL_TABLET | Freq: Every day | ORAL | 3 refills | Status: DC

## 2024-03-31 NOTE — Telephone Encounter (Signed)
 Opened triage encounter, see other nurse note from 6/18.

## 2024-03-31 NOTE — Telephone Encounter (Signed)
 Pt requesting refill of montelukast  last prescribed by Dr. Villa Carrillo in 2018. Pt states that she has been getting med from PCP and she's requesting Dr. Villa Carrillo take over prescribing, but per pt chart, no mention of montelukast  in regimen, notes, or med list for OV on 03/19/24 or 02/06/24 with PCP or with last OV with Dr. Villa Carrillo on 12/22/23. Pt denies worsening symptoms. Please advise.       Copied from CRM [id number]. Topic: Clinical - Medication Refill >> Mar 31, 2024 11:22 AM [name] wrote: Medication: montelukast  (SINGULAIR ) 10 MG tablet   Has the patient contacted their pharmacy? Yes (Agent: If no, request that the patient contact the pharmacy for the refill. If patient does not wish to contact the pharmacy document the reason why and proceed with request.) (Agent: If yes, when and what did the pharmacy advise?)   This is the patient's preferred pharmacy:  [id number] [name], [place] - [address] [place] [address] Phone: [phone] Fax: [phone]     Is this the correct pharmacy for this prescription? Yes If no, delete pharmacy and type the correct one.    Has the prescription been filled recently? Yes   Is the patient out of the medication? No   Has the patient been seen for an appointment in the last year OR does the patient have an upcoming appointment? Yes   Can we respond through MyChart? No   Agent: Please be advised that Rx refills may take up to 3 business days. We ask that you follow-up with your pharmacy. Reason for Disposition  [1] Prescription refill request for NON-ESSENTIAL medicine (i.e., no harm to patient if med not taken) AND [2] triager unable to refill per department policy  Answer Assessment - Initial Assessment Questions 1. DRUG NAME: What medicine do you need to have refilled?     montelukast  2. REFILLS REMAINING: How many refills are remaining? (Note: The label on the medicine or pill bottle will show how many refills  are remaining. If there are no refills remaining, then a renewal may be needed.)     Will be out of it in 3 days, no refills 4. PRESCRIBING HCP: Who prescribed it? Reason: If prescribed by specialist, call should be referred to that group.    Per pt chart, last prescribed for pt on 09/19/2017, prescribed by Dr. Villa Carrillo. Pt reporting that she's been taking it since then and usually get them from PCP because couldn't get from pulm, wanting pulm to take over prescribing again 5. SYMPTOMS: Do you have any symptoms?     States no worsening symptoms  Advised sending request to Dr. Villa Carrillo, advised that if PCP been prescribing then may be quicker to get refill from them, pt prefers to try with Dr. Villa Carrillo first. Advised pt call back if not heard back in next couple days so can send refill request to PCP instead.  Per pt chart, no mention of montelukast  in regimen, notes, or med list for OV on 03/19/24 or 02/06/24 with PCP or with last OV with Dr. Villa Carrillo on 12/22/23.  Protocols used: Medication Refill and Renewal Call-A-AH

## 2024-03-31 NOTE — Telephone Encounter (Signed)
 Rx sent to pharmacy

## 2024-03-31 NOTE — Telephone Encounter (Signed)
 Copied from CRM [id number]. Topic: Clinical - Medication Refill >> Mar 31, 2024 11:22 AM [name] wrote: Medication: montelukast  (SINGULAIR ) 10 MG tablet  Has the patient contacted their pharmacy? Yes (Agent: If no, request that the patient contact the pharmacy for the refill. If patient does not wish to contact the pharmacy document the reason why and proceed with request.) (Agent: If yes, when and what did the pharmacy advise?)  This is the patient's preferred pharmacy:  [id number] [name], [place] - [address] [place] [address] Phone: [phone] Fax: [phone]   Is this the correct pharmacy for this prescription? Yes If no, delete pharmacy and type the correct one.   Has the prescription been filled recently? Yes  Is the patient out of the medication? No  Has the patient been seen for an appointment in the last year OR does the patient have an upcoming appointment? Yes  Can we respond through MyChart? No  Agent: Please be advised that Rx refills may take up to 3 business days. We ask that you follow-up with your pharmacy.

## 2024-04-15 ENCOUNTER — Other Ambulatory Visit: Payer: Self-pay | Admitting: Nurse Practitioner

## 2024-04-28 ENCOUNTER — Other Ambulatory Visit: Payer: Self-pay | Admitting: Nurse Practitioner

## 2024-04-28 ENCOUNTER — Encounter: Payer: Self-pay | Admitting: Nurse Practitioner

## 2024-04-28 DIAGNOSIS — Z1231 Encounter for screening mammogram for malignant neoplasm of breast: Secondary | ICD-10-CM

## 2024-05-15 ENCOUNTER — Other Ambulatory Visit: Payer: Self-pay | Admitting: Nurse Practitioner

## 2024-05-15 DIAGNOSIS — E1165 Type 2 diabetes mellitus with hyperglycemia: Secondary | ICD-10-CM

## 2024-05-24 ENCOUNTER — Ambulatory Visit (INDEPENDENT_AMBULATORY_CARE_PROVIDER_SITE_OTHER): Payer: Self-pay | Admitting: Nurse Practitioner

## 2024-05-24 ENCOUNTER — Encounter: Payer: Self-pay | Admitting: Nurse Practitioner

## 2024-05-24 VITALS — BP 111/74 | HR 89 | Temp 97.3°F | Wt 269.0 lb

## 2024-05-24 DIAGNOSIS — E785 Hyperlipidemia, unspecified: Secondary | ICD-10-CM

## 2024-05-24 DIAGNOSIS — E1165 Type 2 diabetes mellitus with hyperglycemia: Secondary | ICD-10-CM

## 2024-05-24 DIAGNOSIS — J4530 Mild persistent asthma, uncomplicated: Secondary | ICD-10-CM | POA: Diagnosis not present

## 2024-05-24 DIAGNOSIS — M51362 Other intervertebral disc degeneration, lumbar region with discogenic back pain and lower extremity pain: Secondary | ICD-10-CM

## 2024-05-24 DIAGNOSIS — I10 Essential (primary) hypertension: Secondary | ICD-10-CM | POA: Diagnosis not present

## 2024-05-24 DIAGNOSIS — J309 Allergic rhinitis, unspecified: Secondary | ICD-10-CM | POA: Diagnosis not present

## 2024-05-24 MED ORDER — LANCET DEVICE MISC: 1.0000 | Freq: Two times a day (BID) | 0 refills | Status: AC

## 2024-05-24 MED ORDER — BLOOD GLUCOSE TEST VI STRP: 1.0000 | ORAL_STRIP | Freq: Two times a day (BID) | 5 refills | Status: AC

## 2024-05-24 MED ORDER — BLOOD GLUCOSE MONITORING SUPPL DEVI: 1.0000 | Freq: Two times a day (BID) | 0 refills | Status: AC

## 2024-05-24 MED ORDER — LANCETS MISC. MISC: 1.0000 | Freq: Two times a day (BID) | 5 refills | Status: AC

## 2024-05-24 NOTE — Patient Instructions (Signed)

## 2024-05-24 NOTE — Assessment & Plan Note (Signed)
 Type 2 diabetes mellitus without complications Managed with metformin  500 mg daily. Blood sugar levels were good. A1c is under control. Current glucometer supplies will no longer be available after August 3rd. - Continue metformin  500 mg daily. - Encourage lifestyle modifications, including avoiding sugary foods and drinks, and engaging in regular physical activity. - Assist in finding a new glucometer and supplies covered by insurance.

## 2024-05-24 NOTE — Assessment & Plan Note (Signed)
 Lab Results  Component Value Date   CHOL 174 03/19/2024   HDL 59 03/19/2024   LDLCALC 99 03/19/2024   TRIG 89 03/19/2024   CHOLHDL 2.9 03/19/2024   Managed with fluvastatin  80 mg daily. Reports headaches and increased coughing, possible side effects. Cholesterol level was 99 mg/dL, target is less than 70 mg/dL. - Check cholesterol levels. - Discuss potential side effects of fluvastatin , including headaches and bronchitis-like symptoms. - Consider alternative medications if side effects persist, such as switching back to Crestor  if cholesterol levels are not adequately controlled.

## 2024-05-24 NOTE — Assessment & Plan Note (Signed)
 BP Readings from Last 3 Encounters:  05/24/24 111/74  03/19/24 126/66  02/12/24 118/69   Hypertension is well-controlled with hydrochlorothiazide  12.5 mg daily  Continue current medication

## 2024-05-24 NOTE — Assessment & Plan Note (Signed)
  Managed with albuterol  inhaler 1 to 2 puffs every 6 hours as needed, albuterol  nebulizer 2.5 mg every 6 hours as needed and Symbicort  160-4.5 mcg/ACT 2 puffs twice daily   - Continue albuterol  inhaler and Symbicort  as prescribed. Maintain close follow-up with pulmonology

## 2024-05-24 NOTE — Progress Notes (Addendum)
 Established Patient Office Visit  Subjective:  Patient ID: [name], female    DOB: 05/14/1966  Age: 58 y.o. MRN: [id number]  CC: No chief complaint on file.   HPI  Discussed the use of AI scribe software for clinical note transcription with the patient, who gave verbal consent to proceed.  History of Present Illness [name] is a 58 year old female  has a past medical history of Allergy  (2006 not sure), Anxiety (2018), Arthritis (2018), Asthma, Bronchitis, Class 3 severe obesity due to excess calories with serious comorbidity and body mass index (BMI) of 45.0 to 49.9 in adult (12/15/2013), COPD (chronic obstructive pulmonary disease) (HCC) (2006 I believe), Depression (2018), Diabetes mellitus without complication (HCC) (5/8387975), Dyslipidemia, goal LDL below 70 (04/29/2023), GERD (gastroesophageal reflux disease) (Not sure, certain things i eat), Hypertension (2018), Lumbar degenerative disc disease (06/04/2017), Neuromuscular disorder (HCC) (2018), Obesity, Sleep apnea (2005 or 2006), and Vitamin D  deficiency (06/30/2017).  who presents for cholesterol testing and kidney function evaluation.  She experiences headaches primarily affecting her right eye, described as sudden and transient, which began after starting fluvastatin . Initially on 40 mg, the dose was increased to 80 mg daily, but the headaches persist.  She reports fatigue and increased coughing, especially during hot weather, which she associates with her current medication regimen. She has a history of bronchitis symptoms, including increased coughing. She uses an albuterol  inhaler and nebulizer as needed, and takes Symbicort  daily (two puffs BID ). She also uses Flonase  nasal spray and montelukast  at bedtime for allergies. She confirms using her inhalers more frequently during hot weather.  She experiences nerve pain in her thigh, sometimes radiating to her ankles. She takes gabapentin  600 mg at night for  pain,  She has a history of lumbar degeneration and spinal stenosis, and reports that a vascular specialist told her that peripheral artery disease was ruled out.  For diabetes takes metformin  500 mg once daily, . She checks her blood sugar twice daily and is mindful of her diet, avoiding salty foods and sugary drinks. She engages in walking exercises as tolerated.  Taking fluvastatin  80 mg daily for hyperlipidemia  Assessment & Plan      Past Medical History:  Diagnosis Date   Allergy  2006 not sure   Anxiety 2018   Arthritis 2018   Asthma    Bronchitis    Class 3 severe obesity due to excess calories with serious comorbidity and body mass index (BMI) of 45.0 to 49.9 in adult 12/15/2013   COPD (chronic obstructive pulmonary disease) (HCC) 2006 I believe   Depression 2018   Diabetes mellitus without complication (HCC) 5/8387975   Shocking and scary   Dyslipidemia, goal LDL below 70 04/29/2023   GERD (gastroesophageal reflux disease) Not sure, certain things i eat   Hypertension 2018   Lumbar degenerative disc disease 06/04/2017   Neuromuscular disorder (HCC) 2018   Obesity    Sleep apnea 2005 or 2006   uses c pap    Vitamin D  deficiency 06/30/2017    Past Surgical History:  Procedure Laterality Date   DENTAL SURGERY     ECTOPIC PREGNANCY SURGERY  2006    Family History  Problem Relation Age of Onset   Early death Mother    Hypertension Father    Asthma Father    Arthritis Father    Hypertension Sister    Arthritis Sister    Asthma Sister    COPD Sister    Depression Sister  Miscarriages / Stillbirths Sister    Obesity Sister    Hypertension Sister    Stroke Paternal Grandfather    Hypertension Paternal Grandfather    Arthritis Paternal Grandfather    Asthma Son    Colon cancer Neg Hx    Colon polyps Neg Hx    Esophageal cancer Neg Hx    Rectal cancer Neg Hx    Stomach cancer Neg Hx    Breast cancer Neg Hx     Social History   Socioeconomic History    Marital status: Married    Spouse name: Not on file   Number of children: 2   Years of education: Not on file   Highest education level: Not on file  Occupational History   Not on file  Tobacco Use   Smoking status: Never   Smokeless tobacco: Never  Vaping Use   Vaping status: Never Used  Substance and Sexual Activity   Alcohol use: Not Currently    Comment: rare   Drug use: No   Sexual activity: Yes    Birth control/protection: Post-menopausal    Comment: >5 sexual partners  Other Topics Concern   Not on file  Social History Narrative   Lives with her husband   Social Drivers of Health   Financial Resource Strain: Medium Risk (05/29/2023)   Overall Financial Resource Strain (CARDIA)    Difficulty of Paying Living Expenses: Somewhat hard  Food Insecurity: Food Insecurity Present (12/19/2023)   Hunger Vital Sign    Worried About Running Out of Food in the Last Year: Sometimes true    Ran Out of Food in the Last Year: Sometimes true  Transportation Needs: No Transportation Needs (05/29/2023)   PRAPARE - Administrator, Civil Service (Medical): No    Lack of Transportation (Non-Medical): No  Physical Activity: Insufficiently Active (05/29/2023)   Exercise Vital Sign    Days of Exercise per Week: 7 days    Minutes of Exercise per Session: 20 min  Stress: No Stress Concern Present (05/29/2023)   [employer] of Occupational Health - Occupational Stress Questionnaire    Feeling of Stress : Not at all  Social Connections: Moderately Integrated (05/29/2023)   Social Connection and Isolation Panel    Frequency of Communication with Friends and Family: More than three times a week    Frequency of Social Gatherings with Friends and Family: More than three times a week    Attends Religious Services: More than 4 times per year    Active Member of [employer] or Organizations: No    Attends Banker Meetings: Never    Marital Status: Married  Catering manager  Violence: Not At Risk (12/18/2023)   Received from [facility]   HITS    Over the last 12 months how often did your partner physically hurt you?: Never    Over the last 12 months how often did your partner insult you or talk down to you?: Never    Over the last 12 months how often did your partner threaten you with physical harm?: Never    Over the last 12 months how often did your partner scream or curse at you?: Never    Outpatient Medications Prior to Visit  Medication Sig Dispense Refill   albuterol  (PROVENTIL ) (2.5 MG/3ML) 0.083% nebulizer solution Take 3 mLs (2.5 mg total) by nebulization every 6 (six) hours as needed for wheezing. 75 mL 1   budesonide -formoterol  (SYMBICORT ) 160-4.5 MCG/ACT inhaler Inhale 2 puffs into  the lungs in the morning and at bedtime. 10.2 each 6   clobetasol  ointment (TEMOVATE ) 0.05 % Apply 1 Application topically 2 (two) times daily. Apply a small amount topically twice daily for up to 2 weeks (Patient taking differently: Apply 1 Application topically 2 (two) times daily. Apply a small amount topically twice daily for up to 2 weeks) 60 g 0   diclofenac  Sodium (VOLTAREN  ARTHRITIS PAIN) 1 % GEL Apply 4 g topically 4 (four) times daily. 100 g 0   fluticasone  (FLONASE ) 50 MCG/ACT nasal spray SPRAY 2 SPRAYS INTO EACH NOSTRIL EVERY DAY 48 mL 2   fluvastatin  XL (LESCOL  XL) 80 MG 24 hr tablet Take 1 tablet (80 mg total) by mouth daily. 90 tablet 1   gabapentin  (NEURONTIN ) 600 MG tablet Take 1 tablet (600 mg total) by mouth at bedtime. 90 tablet 1   hydrochlorothiazide  (HYDRODIURIL ) 12.5 MG tablet TAKE 1 TABLET BY MOUTH EVERY DAY 90 tablet 1   ibuprofen  (ADVIL ) 800 MG tablet Take 1 tablet (800 mg total) by mouth every 8 (eight) hours as needed. 30 tablet 1   metFORMIN  (GLUCOPHAGE -XR) 500 MG 24 hr tablet TAKE 2 TABLETS BY MOUTH EVERY DAY WITH BREAKFAST 180 tablet 1   methocarbamol  (ROBAXIN ) 500 MG tablet Take 1 tablet (500 mg total) by mouth 2 (two) times daily. 20 tablet 0    montelukast  (SINGULAIR ) 10 MG tablet Take 1 tablet (10 mg total) by mouth at bedtime. 30 tablet 3   albuterol  (VENTOLIN  HFA) 108 (90 Base) MCG/ACT inhaler Inhale 1-2 puffs into the lungs every 6 (six) hours as needed for wheezing or shortness of breath. 1 each 1   ezetimibe  (ZETIA ) 10 MG tablet TAKE 1 TABLET BY MOUTH EVERY DAY (Patient not taking: Reported on 05/24/2024) 90 tablet 0   Nebulizers (COMPRESSOR/NEBULIZER) MISC As directed 1 each 0   olopatadine  (PATANOL) 0.1 % ophthalmic solution Place 1 drop into the right eye 2 (two) times daily. (Patient not taking: Reported on 05/24/2024) 5 mL 0   polyethylene glycol powder (GLYCOLAX /MIRALAX ) 17 GM/SCOOP powder DISSOLVE 17 GRAMS IN 8 OZ OF FLUID LIQUID DRINK DAILY AS DIRECTED (Patient not taking: Reported on 05/24/2024) 238 g 0   Blood Glucose Monitoring Suppl (ONETOUCH VERIO FLEX SYSTEM) DEVI 1 each by Does not apply route as directed. To check blood sugars 1 each 0   erythromycin ophthalmic ointment Place into the left eye in the morning and at bedtime. (Patient not taking: Reported on 02/06/2024)     glucose blood (ONETOUCH VERIO) test strip Use as instructed to check blood sugars twice daily and once additional if elevated. 300 strip 1   Magnesium 100 MG CAPS Take by mouth. (Patient not taking: Reported on 05/24/2024)     No facility-administered medications prior to visit.    Allergies  Allergen Reactions   Rosuvastatin  Other (See Comments)   Seasonal Ic [Octacosanol]     ROS Review of Systems  Constitutional:  Negative for appetite change, chills, fatigue and fever.  HENT:  Negative for congestion, postnasal drip, rhinorrhea and sneezing.   Respiratory:  Positive for wheezing. Negative for cough and shortness of breath.        Mild wheezing noted billaterally  Cardiovascular:  Negative for chest pain, palpitations and leg swelling.  Gastrointestinal:  Negative for abdominal pain, constipation, nausea and vomiting.  Genitourinary:   Negative for difficulty urinating, dysuria, flank pain and frequency.  Musculoskeletal:  Negative for arthralgias, back pain, joint swelling and myalgias.  Skin:  Negative  for color change, pallor, rash and wound.  Neurological:  Negative for dizziness, facial asymmetry, weakness, numbness and headaches.  Psychiatric/Behavioral:  Negative for behavioral problems, confusion, self-injury and suicidal ideas.       Objective:    Physical Exam Vitals and nursing note reviewed.  Constitutional:      General: She is not in acute distress.    Appearance: Normal appearance. She is obese. She is not ill-appearing, toxic-appearing or diaphoretic.  Eyes:     General: No scleral icterus.       Right eye: No discharge.        Left eye: No discharge.     Extraocular Movements: Extraocular movements intact.     Conjunctiva/sclera: Conjunctivae normal.  Cardiovascular:     Rate and Rhythm: Normal rate and regular rhythm.     Pulses: Normal pulses.     Heart sounds: Normal heart sounds. No murmur heard.    No friction rub. No gallop.  Pulmonary:     Effort: Pulmonary effort is normal. No respiratory distress.     Breath sounds: Normal breath sounds. No stridor. No wheezing, rhonchi or rales.  Chest:     Chest wall: No tenderness.  Abdominal:     General: There is no distension.     Palpations: Abdomen is soft.     Tenderness: There is no abdominal tenderness. There is no right CVA tenderness, left CVA tenderness or guarding.  Musculoskeletal:        General: No swelling, tenderness or signs of injury.     Right lower leg: No edema.     Left lower leg: No edema.     Comments: Using a cane for ambulation  Skin:    General: Skin is warm and dry.     Capillary Refill: Capillary refill takes less than 2 seconds.     Coloration: Skin is not jaundiced or pale.     Findings: No bruising, erythema or lesion.  Neurological:     Mental Status: She is alert and oriented to person, place, and time.      Motor: No weakness.  Psychiatric:        Mood and Affect: Mood normal.        Behavior: Behavior normal.        Thought Content: Thought content normal.        Judgment: Judgment normal.     BP 111/74   Pulse 89   Temp (!) 97.3 F (36.3 C)   Wt 269 lb (122 kg)   LMP  (LMP Unknown)   SpO2 94%   BMI 44.76 kg/m  Wt Readings from Last 3 Encounters:  05/24/24 269 lb (122 kg)  03/19/24 264 lb (119.7 kg)  02/12/24 262 lb 6.4 oz (119 kg)    Lab Results  Component Value Date   TSH 1.42 01/31/2023   Lab Results  Component Value Date   WBC 5.9 01/31/2023   HGB 14.9 01/31/2023   HCT 45 01/31/2023   MCV 94.3 01/28/2023   PLT 254 01/31/2023   Lab Results  Component Value Date   NA 142 03/19/2024   K 4.3 03/19/2024   CO2 24 03/19/2024   GLUCOSE 80 03/19/2024   BUN 14 03/19/2024   CREATININE 0.70 03/19/2024   BILITOT 0.2 07/18/2023   ALKPHOS 62 07/18/2023   AST 22 07/18/2023   ALT 15 07/18/2023   PROT 6.9 07/18/2023   ALBUMIN 4.2 07/18/2023   CALCIUM  10.0 03/19/2024   ANIONGAP 14 01/28/2023  EGFR 100 03/19/2024   GFR 116.24 09/24/2017   Lab Results  Component Value Date   CHOL 174 03/19/2024   Lab Results  Component Value Date   HDL 59 03/19/2024   Lab Results  Component Value Date   LDLCALC 99 03/19/2024   Lab Results  Component Value Date   TRIG 89 03/19/2024   Lab Results  Component Value Date   CHOLHDL 2.9 03/19/2024   Lab Results  Component Value Date   HGBA1C 6.0 (A) 03/19/2024      Assessment & Plan:   Problem List Items Addressed This Visit       Cardiovascular and Mediastinum   HTN (hypertension)   BP Readings from Last 3 Encounters:  05/24/24 111/74  03/19/24 126/66  02/12/24 118/69   Hypertension is well-controlled with hydrochlorothiazide  12.5 mg daily  Continue current medication        Respiratory   Asthma    Managed with albuterol  inhaler 1 to 2 puffs every 6 hours as needed, albuterol  nebulizer 2.5 mg every 6  hours as needed and Symbicort  160-4.5 mcg/ACT 2 puffs twice daily   - Continue albuterol  inhaler and Symbicort  as prescribed. Maintain close follow-up with pulmonology        Allergic rhinitis   Continue Flonase  nasal spray 2 spray into both nostrils daily and Singulair  10 mg at bedtime        Endocrine   Type 2 diabetes mellitus with hyperglycemia, without long-term current use of insulin (HCC)   Type 2 diabetes mellitus without complications Managed with metformin  500 mg daily. Blood sugar levels were good. A1c is under control. Current glucometer supplies will no longer be available after August 3rd. - Continue metformin  500 mg daily. - Encourage lifestyle modifications, including avoiding sugary foods and drinks, and engaging in regular physical activity. - Assist in finding a new glucometer and supplies covered by insurance.      Relevant Medications   Blood Glucose Monitoring Suppl DEVI   Glucose Blood (BLOOD GLUCOSE TEST STRIPS) STRP   Lancet Device MISC   Lancets Misc. MISC   Other Relevant Orders   Lipid panel   Microalbumin / creatinine urine ratio     Musculoskeletal and Integument   Lumbar degenerative disc disease - Primary    Lumbar degenerative disc disease with neuropathic pain radiating from the thigh to the ankle. Previous referral to a back specialist was not followed up. Reports significant nerve pain in the thigh area. - Follow up on referral to Dr. Burnetta for further evaluation. - Consider contacting the specialist's office to confirm receipt of referral. Continue gabapentin  600 mg at bedtime, ibuprofen  800 mg 3 times daily as needed,         Other   Dyslipidemia, goal LDL below 70   Lab Results  Component Value Date   CHOL 174 03/19/2024   HDL 59 03/19/2024   LDLCALC 99 03/19/2024   TRIG 89 03/19/2024   CHOLHDL 2.9 03/19/2024   Managed with fluvastatin  80 mg daily. Reports headaches and increased coughing, possible side effects. Cholesterol  level was 99 mg/dL, target is less than 70 mg/dL. - Check cholesterol levels. - Discuss potential side effects of fluvastatin , including headaches and bronchitis-like symptoms. - Consider alternative medications if side effects persist, such as switching back to Crestor  if cholesterol levels are not adequately controlled.       Meds ordered this encounter  Medications   Blood Glucose Monitoring Suppl DEVI    Sig: 1 each by  Does not apply route in the morning and at bedtime. May substitute to any manufacturer covered by patient's insurance.    Dispense:  1 each    Refill:  0   Glucose Blood (BLOOD GLUCOSE TEST STRIPS) STRP    Sig: 1 each by In Vitro route in the morning and at bedtime. May substitute to any manufacturer covered by patient's insurance.    Dispense:  200 strip    Refill:  5   Lancet Device MISC    Sig: 1 each by Does not apply route 2 (two) times daily. May substitute to any manufacturer covered by patient's insurance.    Dispense:  1 each    Refill:  0   Lancets Misc. MISC    Sig: 1 each by Does not apply route 2 (two) times daily. May substitute to any manufacturer covered by patient's insurance.    Dispense:  200 each    Refill:  5    Follow-up: Return in about 2 months (around 07/24/2024) for CPE.    Slyvester Latona R Liona Wengert, FNP

## 2024-05-24 NOTE — Addendum Note (Signed)
 Addended by: JUANICE THOMES SAUNDERS on: 05/24/2024 06:22 PM   Modules accepted: Orders

## 2024-05-24 NOTE — Assessment & Plan Note (Signed)
 Continue Flonase  nasal spray 2 spray into both nostrils daily and Singulair  10 mg at bedtime

## 2024-05-24 NOTE — Assessment & Plan Note (Signed)
  Lumbar degenerative disc disease with neuropathic pain radiating from the thigh to the ankle. Previous referral to a back specialist was not followed up. Reports significant nerve pain in the thigh area. - Follow up on referral to Dr. Burnetta for further evaluation. - Consider contacting the specialist's office to confirm receipt of referral. Continue gabapentin  600 mg at bedtime, ibuprofen  800 mg 3 times daily as needed,

## 2024-05-25 ENCOUNTER — Ambulatory Visit: Payer: Self-pay | Admitting: Nurse Practitioner

## 2024-05-25 LAB — LIPID PANEL
Chol/HDL Ratio: 2.4 ratio (ref 0.0–4.4)
Cholesterol, Total: 156 mg/dL (ref 100–199)
HDL: 64 mg/dL (ref >39)
LDL Chol Calc (NIH): 77 mg/dL (ref 0–99)
Triglycerides: 81 mg/dL (ref 0–149)
VLDL Cholesterol Cal: 15 mg/dL (ref 5–40)

## 2024-05-25 LAB — MICROALBUMIN / CREATININE URINE RATIO
Creatinine, Urine: 77 mg/dL
Microalb/Creat Ratio: 10 mg/g{creat} (ref 0–29)
Microalbumin, Urine: 7.4 ug/mL

## 2024-05-31 DIAGNOSIS — G4733 Obstructive sleep apnea (adult) (pediatric): Secondary | ICD-10-CM | POA: Diagnosis not present

## 2024-06-03 ENCOUNTER — Ambulatory Visit: Payer: Self-pay

## 2024-06-03 ENCOUNTER — Telehealth: Payer: Self-pay

## 2024-06-03 NOTE — Telephone Encounter (Signed)
 Sent to provider

## 2024-06-07 ENCOUNTER — Other Ambulatory Visit: Payer: Self-pay | Admitting: Nurse Practitioner

## 2024-06-07 DIAGNOSIS — E785 Hyperlipidemia, unspecified: Secondary | ICD-10-CM

## 2024-06-07 MED ORDER — ROSUVASTATIN CALCIUM 5 MG PO TABS: 5.0000 mg | ORAL_TABLET | Freq: Every day | ORAL | 11 refills | Status: DC

## 2024-06-10 ENCOUNTER — Ambulatory Visit (INDEPENDENT_AMBULATORY_CARE_PROVIDER_SITE_OTHER): Payer: Medicare Other | Admitting: Podiatry

## 2024-06-10 ENCOUNTER — Encounter: Payer: Self-pay | Admitting: Podiatry

## 2024-06-10 DIAGNOSIS — M79675 Pain in left toe(s): Secondary | ICD-10-CM | POA: Diagnosis not present

## 2024-06-10 DIAGNOSIS — B351 Tinea unguium: Secondary | ICD-10-CM | POA: Diagnosis not present

## 2024-06-10 DIAGNOSIS — E1165 Type 2 diabetes mellitus with hyperglycemia: Secondary | ICD-10-CM

## 2024-06-10 DIAGNOSIS — M79674 Pain in right toe(s): Secondary | ICD-10-CM | POA: Diagnosis not present

## 2024-06-10 DIAGNOSIS — B353 Tinea pedis: Secondary | ICD-10-CM | POA: Diagnosis not present

## 2024-06-10 MED ORDER — CICLOPIROX OLAMINE 0.77 % EX CREA: TOPICAL_CREAM | Freq: Two times a day (BID) | CUTANEOUS | 0 refills | Status: AC

## 2024-06-10 NOTE — Progress Notes (Unsigned)
 Subjective:  Patient ID: [name], female    DOB: 07/06/1966,  MRN: [id number]  Chief Complaint  Patient presents with   [facility]    [facility] A1c 6.0.  no anti coag    58 y.o. female presents for diabetic foot exam.  She does have painful, thickened, elongated dystrophic toenails that are difficult for her to maintain herself due to their thickness and her length and due to their overall morphology.  Following last visit had been using ketoconazole  cream for tinea pedis right greater than left.  She has noticed brief improvement with this but has noticed some flareup and recurrence of the rash which does cause some itching.  Does also complain of neuropathic pain and sciatica.  She does take gabapentin  intermittently for this.  A1c 6.0.  Past Medical History:  Diagnosis Date   Allergy  2006 not sure   Anxiety 2018   Arthritis 2018   Asthma    Bronchitis    Class 3 severe obesity due to excess calories with serious comorbidity and body mass index (BMI) of 45.0 to 49.9 in adult 12/15/2013   COPD (chronic obstructive pulmonary disease) (HCC) 2006 I believe   Depression 2018   Diabetes mellitus without complication (HCC) 5/8387975   Shocking and scary   Dyslipidemia, goal LDL below 70 04/29/2023   GERD (gastroesophageal reflux disease) Not sure, certain things i eat   Hypertension 2018   Lumbar degenerative disc disease 06/04/2017   Neuromuscular disorder (HCC) 2018   Obesity    Sleep apnea 2005 or 2006   uses c pap    Vitamin D  deficiency 06/30/2017    Allergies  Allergen Reactions   Rosuvastatin  Other (See Comments)   Seasonal Ic [Octacosanol]     ROS: Negative except as per HPI above  Objective:  General: AAO x3, NAD  Dermatological: Bilateral heels primarily extending into the midfoot there is flaky, xerotic pruritic rash without significant erythema Nailplates x 10 bilaterally are thickened, elongated, dystrophic with yellow discoloration similar 3.  They are tender  on direct dorsal palpation.  Vascular:  Dorsalis Pedis artery and Posterior Tibial artery pedal pulses are 2/4 bilateral.  Capillary fill time < 3 sec to all digits.   Neruologic: Grossly intact via light touch bilateral. Protective threshold intact to all sites bilateral.   Musculoskeletal: No gross boney pedal deformities bilateral. No pain, crepitus, or limitation noted with foot and ankle range of motion bilateral. Muscular strength 5/5 in all groups tested bilateral.  Gait: Ambulating with a cane today  No images are attached to the encounter.  Radiographs:  Deferred Assessment:   1. Tinea pedis of both feet   2. Type 2 diabetes mellitus with hyperglycemia, without long-term current use of insulin (HCC)   3. Pain due to onychomycosis of toenails of both feet      Plan:  Patient was evaluated and treated and all questions answered.  # Tinea pedis bilateral Discussed the etiology and treatment options for tinea pedis.  Discussed topical and oral treatment.  We will try ciclopirox  cream 0.77% twice a day over the next 3 weeks to see if we can get the rash to fully resolve.  Rx sent to patient's pharmacy this was sent to the patient's pharmacy.  Also discussed appropriate foot hygiene, use of antifungal spray such as Tinactin in shoes, as well as cleaning her foot surfaces such as showers and bathroom floors with bleach. - Reevaluate this in 3 to 4 weeks -I certify that this diagnosis  represents a distinct and separate diagnosis that requires evaluation and treatment separate from other procedures or diagnosis   #DM2 with peripheral neuropathy Patient educated on diabetes. Discussed proper diabetic foot care and discussed risks and complications of disease. Educated patient in depth on reasons to return to the office immediately should he/she discover anything concerning or new on the feet. All questions answered. Discussed proper shoes as well.   # Pain due to onychomycosis of  toenails -Nails palliatively debrided as below. -Educated on self-care  Procedure: Nail Debridement Rationale: Pain Type of Debridement: manual, sharp debridement. Instrumentation: Nail nipper, rotary burr. Number of Nails: 10    Return in about 4 weeks (around 07/08/2024) for Tinea pedis.          Nayvie Lips L. Lamount, DPM [facility] / [facility]

## 2024-06-11 ENCOUNTER — Ambulatory Visit
Admission: RE | Admit: 2024-06-11 | Discharge: 2024-06-11 | Disposition: A | Discharge status: Home / Self Care | Source: Ambulatory Visit | Attending: Nurse Practitioner | Admitting: Nurse Practitioner

## 2024-06-11 DIAGNOSIS — Z1231 Encounter for screening mammogram for malignant neoplasm of breast: Secondary | ICD-10-CM | POA: Diagnosis not present

## 2024-06-15 ENCOUNTER — Other Ambulatory Visit: Payer: Self-pay

## 2024-06-17 ENCOUNTER — Ambulatory Visit (HOSPITAL_BASED_OUTPATIENT_CLINIC_OR_DEPARTMENT_OTHER): Admitting: Pulmonary Disease

## 2024-06-17 ENCOUNTER — Encounter (HOSPITAL_BASED_OUTPATIENT_CLINIC_OR_DEPARTMENT_OTHER): Payer: Self-pay | Admitting: Pulmonary Disease

## 2024-06-17 VITALS — BP 132/84 | HR 75 | Ht 65.0 in | Wt 268.0 lb

## 2024-06-17 DIAGNOSIS — G4733 Obstructive sleep apnea (adult) (pediatric): Secondary | ICD-10-CM | POA: Diagnosis not present

## 2024-06-17 DIAGNOSIS — J45909 Unspecified asthma, uncomplicated: Secondary | ICD-10-CM

## 2024-06-17 DIAGNOSIS — E669 Obesity, unspecified: Secondary | ICD-10-CM

## 2024-06-17 DIAGNOSIS — J4530 Mild persistent asthma, uncomplicated: Secondary | ICD-10-CM

## 2024-06-17 NOTE — Progress Notes (Signed)
 Subjective:    Patient ID: [name], female    DOB: [birth year], 58 y.o.   MRN: [id number]    58 yo never smoker for FU of asthma and mod obstructive sleep apnea.  -On auto CPAP    Asthma was diagnosed in 2006. Her triggers include uri and weather changes. h/o seasonal allergies >>  No formal allergy  testing   PMH - HFpEF   Peak wt 302 lbs  Discussed the use of AI scribe software for clinical note transcription with the patient, who gave verbal consent to proceed.  History of Present Illness  Discussed the use of AI scribe software for clinical note transcription with the patient, who gave verbal consent to proceed.  History of Present Illness   [name] is a 58 year old female with asthma and obstructive sleep apnea who presents for follow-up.  She experiences a sensation of fluid in her lungs and mild back pain over the shoulder area. Mucus production is particularly problematic in the morning, and she attributes this to her CPAP machine. She feels tired and is concerned about the risk of pneumonia, as her sister developed pneumonia while using a CPAP machine.  A home sleep test in April 2025 showed mild obstructive sleep apnea with 10 hypopnea events per hour, improved from 18 to 20 events per hour previously. She has lost weight, which she believes has helped reduce the severity of her OSA. She uses her CPAP machine regularly, set to nine centimeters of pressure, but wishes to discontinue its use due to discomfort.  She is on Symbicort  for asthma and uses it daily. Butisol is used when wheezing occurs, particularly more in the summertime. She recently refilled her prescription for Singulair . She denies significant wheezing or increased use of her rescue inhaler beyond the summer months.  She uses Flonase  in the morning and notices a little blood when blowing her nose but denies being on blood thinners.        Significant tests/ events reviewed    01/2022 spirometry shows no obstruction with ratio 84, FEV1 73%, FVC 69%   01/29/2012 Spirometry showed no airway obstruction, FEV1 was 82% FVC was 79% ratio 79 PSG 2013 moderate obstructive sleep apnea - AHI 18/h   HST 01/2024 showed mild  OSA with AHI 10/ hr & low sat 80%    Review of Systems  neg for any significant sore throat, dysphagia, itching, sneezing, nasal congestion or excess/ purulent secretions, fever, chills, sweats, unintended wt loss, pleuritic or exertional cp, hempoptysis, orthopnea pnd or change in chronic leg swelling. Also denies presyncope, palpitations, heartburn, abdominal pain, nausea, vomiting, diarrhea or change in bowel or urinary habits, dysuria,hematuria, rash, arthralgias, visual complaints, headache, numbness weakness or ataxia.      Objective:   Physical Exam  Gen. Pleasant, obese, in no distress ENT - no lesions, no post nasal drip Neck: No JVD, no thyromegaly, no carotid bruits Lungs: no use of accessory muscles, no dullness to percussion, decreased without rales or rhonchi  Cardiovascular: Rhythm regular, heart sounds  normal, no murmurs or gallops, no peripheral edema Musculoskeletal: No deformities, no cyanosis or clubbing , no tremors       Assessment & Plan:   Assessment and Plan Assessment & Plan  Assessment and Plan    Asthma Asthma is generally controlled with current medication regimen, but increased use of rescue inhaler during summer months. - Continue Symbicort  daily. - Use rescue inhaler as needed for wheezing. - Refill Singulair   prescription.  Obstructive sleep apnea, mild Mild obstructive sleep apnea with improvement after weight loss. Current AHI is 10 events per hour, reduced from previous 18-20 events per hour. CPAP therapy is effective with excellent compliance and control of events. Concerns about moisture from CPAP causing morning mucus production, but no evidence of infection. Discussed potential for dental device as an  alternative to CPAP. - Continue CPAP therapy at 9 cm H2O. - Provide referral to a sleep dentist for evaluation of a dental device. - Advise on CPAP maintenance: rinse hose with soap and water every two weeks, wipe mask with baby wipe daily, change filter every three months. - Consider reducing moisture level in CPAP if morning mucus is bothersome.  Obesity Obesity with significant weight loss achieved, contributing to improvement in sleep apnea. - Advise further weight loss.  Allergic rhinitis Allergic rhinitis managed with Flonase . - Continue Flonase , advise use at night instead of morning.

## 2024-06-17 NOTE — Patient Instructions (Signed)
 X referral to sleep dentist - either Dr Melba or Dr Micky  CPAP is working well

## 2024-06-18 ENCOUNTER — Ambulatory Visit: Payer: Self-pay | Admitting: Nurse Practitioner

## 2024-07-08 ENCOUNTER — Encounter: Payer: Self-pay | Admitting: Podiatry

## 2024-07-08 ENCOUNTER — Ambulatory Visit (INDEPENDENT_AMBULATORY_CARE_PROVIDER_SITE_OTHER): Admitting: Podiatry

## 2024-07-08 DIAGNOSIS — M25572 Pain in left ankle and joints of left foot: Secondary | ICD-10-CM | POA: Diagnosis not present

## 2024-07-08 DIAGNOSIS — B353 Tinea pedis: Secondary | ICD-10-CM

## 2024-07-08 DIAGNOSIS — G8929 Other chronic pain: Secondary | ICD-10-CM

## 2024-07-08 NOTE — Progress Notes (Signed)
 Chief Complaint  Patient presents with   Tinea Pedis    Bilateral tinea pedis re check. Has been keeping clean. using ciclopirox  cream. Feels like there is some improvement.   Diabetic A1c 6.0.  no anti coag    HPI: 58 y.o. female presents today following up for right greater than left tinea pedis.  Has been using the ciclopirox  antifungal cream.  Reports pretty good improvement with this.  Itching has subsided at this point but does notice this mainly right posterior heel.  She does also report pain to the left anterior lateral ankle which has been going on for several weeks.  She does state that this is chronic and recurrent and she denies any specific injury.  She does endorse low back pain and sciatica-like symptoms as well as right sided foot and ankle weakness and uses a cane.  Past Medical History:  Diagnosis Date   Allergy  2006 not sure   Anxiety 2018   Arthritis 2018   Asthma    Bronchitis    Class 3 severe obesity due to excess calories with serious comorbidity and body mass index (BMI) of 45.0 to 49.9 in adult 12/15/2013   COPD (chronic obstructive pulmonary disease) (HCC) 2006 I believe   Depression 2018   Diabetes mellitus without complication (HCC) 5/8387975   Shocking and scary   Dyslipidemia, goal LDL below 70 04/29/2023   GERD (gastroesophageal reflux disease) Not sure, certain things i eat   Hypertension 2018   Lumbar degenerative disc disease 06/04/2017   Neuromuscular disorder (HCC) 2018   Obesity    Sleep apnea 2005 or 2006   uses c pap    Vitamin D  deficiency 06/30/2017    Past Surgical History:  Procedure Laterality Date   DENTAL SURGERY     ECTOPIC PREGNANCY SURGERY  2006    Allergies  Allergen Reactions   Rosuvastatin  Other (See Comments)   Seasonal Ic [Octacosanol]     ROS    Physical Exam: There were no vitals filed for this visit.  General: The patient is alert and oriented x3 in no acute distress.  Dermatology: Pedal skin right  greater than left, mild flaking and xerosis present.  No erythema, nonpurulent exudate.  No open wounds or other lesions noted.  Vascular: Palpable pedal pulses bilaterally. Capillary refill within normal limits.  No diffuse appreciable edema.  No erythema or calor.  Neurological: Light touch sensation grossly intact bilateral feet.   Musculoskeletal Exam: Pain on palpation of left anterior and lateral ankle structures.  No gross instability present.  Left lower extremity muscle strength 4/5 in dorsiflexion, plantarflexion, inversion, eversion.  Decreased muscle strength to right side.   Assessment/Plan of Care: 1. Chronic pain of left ankle   2. Tinea pedis of both feet      No orders of the defined types were placed in this encounter.  None  Discussed clinical findings with patient today.  Has had pretty good improvement to the tinea pedis.  I did recommend that she continue the ciclopirox  cream for another 1 to 2 weeks as directed.  Discussed importance of good pedal hygiene in the treatment and maintenance of tinea pedis.  Chronic left ankle pain did apply and dispensed a compressive anklet device.  She does take ibuprofen  800 mg at home, I did discuss that this would be beneficial for her.  Discussed some home exercises for chronic ankle instability as well that she can do to the best of her  ability.  Will reevaluate this further in 3-4 weeks if ongoing, will obtain radiographs at this time.   Antwon Rochin L. Lamount MAUL, AACFAS [facility]     [address] [place], [place] [address]                Office [phone]  Fax [phone]

## 2024-07-19 ENCOUNTER — Encounter: Payer: Self-pay | Admitting: Nurse Practitioner

## 2024-07-19 DIAGNOSIS — Z0000 Encounter for general adult medical examination without abnormal findings: Secondary | ICD-10-CM | POA: Insufficient documentation

## 2024-07-26 ENCOUNTER — Encounter: Payer: Self-pay | Admitting: Nurse Practitioner

## 2024-07-26 ENCOUNTER — Ambulatory Visit (INDEPENDENT_AMBULATORY_CARE_PROVIDER_SITE_OTHER): Payer: Self-pay | Admitting: Nurse Practitioner

## 2024-07-26 VITALS — BP 140/60 | HR 68 | Wt 266.0 lb

## 2024-07-26 DIAGNOSIS — J45909 Unspecified asthma, uncomplicated: Secondary | ICD-10-CM | POA: Diagnosis not present

## 2024-07-26 DIAGNOSIS — E1165 Type 2 diabetes mellitus with hyperglycemia: Secondary | ICD-10-CM | POA: Diagnosis not present

## 2024-07-26 DIAGNOSIS — Z0000 Encounter for general adult medical examination without abnormal findings: Secondary | ICD-10-CM

## 2024-07-26 DIAGNOSIS — I10 Essential (primary) hypertension: Secondary | ICD-10-CM

## 2024-07-26 DIAGNOSIS — E785 Hyperlipidemia, unspecified: Secondary | ICD-10-CM | POA: Diagnosis not present

## 2024-07-26 DIAGNOSIS — J209 Acute bronchitis, unspecified: Secondary | ICD-10-CM | POA: Insufficient documentation

## 2024-07-26 DIAGNOSIS — F4321 Adjustment disorder with depressed mood: Secondary | ICD-10-CM

## 2024-07-26 DIAGNOSIS — Z634 Disappearance and death of family member: Secondary | ICD-10-CM | POA: Diagnosis not present

## 2024-07-26 LAB — POCT GLYCOSYLATED HEMOGLOBIN (HGB A1C): Hemoglobin A1C: 6.1 % — AB (ref 4.0–5.6)

## 2024-07-26 MED ORDER — AZITHROMYCIN 250 MG PO TABS: ORAL_TABLET | ORAL | 0 refills | Status: AC

## 2024-07-26 NOTE — Assessment & Plan Note (Signed)
 Emotional support provided Declined referral for grief counseling

## 2024-07-26 NOTE — Assessment & Plan Note (Signed)
 BP Readings from Last 3 Encounters:  07/26/24 (!) 140/60  06/17/24 132/84  05/24/24 111/74   Blood pressure slightly elevated, possibly due to grief. Current regimen effective. - Continue hydrochlorothiazide  12.5 mg daily. Continue current medications. No changes in management. Discussed DASH diet and dietary sodium restrictions Continue to increase dietary efforts and exercise.

## 2024-07-26 NOTE — Patient Instructions (Signed)
   4. Acute bronchiolitis due to unspecified organism  - azithromycin  (ZITHROMAX ) 250 MG tablet; Take 2 tablets on day 1, then 1 tablet daily on days 2 through 5  Dispense: 6 tablet; Refill: 0    It is important that you exercise regularly at least 30 minutes 5 times a week as tolerated  Think about what you will eat, plan ahead. Choose  clean, green, fresh or frozen over canned, processed or packaged foods which are more sugary, salty and fatty. 70 to 75% of food eaten should be vegetables and fruit. Three meals at set times with snacks allowed between meals, but they must be fruit or vegetables. Aim to eat over a 12 hour period , example 7 am to 7 pm, and STOP after  your last meal of the day. Drink water,generally about 64 ounces per day, no other drink is as healthy. Fruit juice is best enjoyed in a healthy way, by EATING the fruit.  Thanks for choosing [facility] we consider it a privelige to serve you.

## 2024-07-26 NOTE — Assessment & Plan Note (Signed)
  Persistent cough and phlegm suggest bronchitis. No fever or significant wheezing. Antibiotics considered. - Prescribe azithromycin

## 2024-07-26 NOTE — Assessment & Plan Note (Signed)
 Lab Results  Component Value Date   CHOL 156 05/24/2024   HDL 64 05/24/2024   LDLCALC 77 05/24/2024   TRIG 81 05/24/2024   CHOLHDL 2.4 05/24/2024   Good tolerance to rosuvastatin . Goal to maintain LDL under 70 mg/dL. - Continue rosuvastatin  5 mg daily - Perform lipid panel.

## 2024-07-26 NOTE — Assessment & Plan Note (Signed)
 Lab Results  Component Value Date   HGBA1C 6.1 (A) 07/26/2024   Diabetes well-controlled with A1c at 6.1%. - Continue metformin  500 mg daily. - Performed diabetic foot exam.

## 2024-07-26 NOTE — Assessment & Plan Note (Addendum)
  Annual exam scheduled. Blood pressure slightly elevated, possibly due to grief. - Perform CBC, CMP, and lipid panel. - Encourage regular exercise and a heart-healthy, low-salt, low-fat diet. - Encourage a low-carb diet for diabetes management. - Encourage 7-8 hours of sleep nightly. - Encourage hydration with at least 64 ounces of water daily. Advised patient to get pneumococcal vaccine, shingles vaccine at the pharmacy

## 2024-07-26 NOTE — Progress Notes (Signed)
 Complete physical exam  Patient: [name]   DOB: 1965-11-26   58 y.o. Female  MRN: [id number]  Subjective:    Chief Complaint  Patient presents with   Annual Exam       Discussed the use of AI scribe software for clinical note transcription with the patient, who gave verbal consent to proceed.  History of Present Illness [name] is a 58 year old female  has a past medical history of Allergy  (2006 not sure), Anxiety (2018), Arthritis (2018), Asthma, Bronchitis, Class 3 severe obesity due to excess calories with serious comorbidity and body mass index (BMI) of 45.0 to 49.9 in adult [facility]) (12/15/2013), COPD (chronic obstructive pulmonary disease) (HCC) (2006 I believe), Depression (2018), Diabetes mellitus without complication (HCC) (5/8387975), Dyslipidemia, goal LDL below 70 (04/29/2023), GERD (gastroesophageal reflux disease) (Not sure, certain things i eat), Hypertension (2018), Lumbar degenerative disc disease (06/04/2017), Neuromuscular disorder (HCC) (2018), Obesity, Sleep apnea (2005 or 2006), and Vitamin D  deficiency (06/30/2017). who presents for an annual physical exam.  She has a persistent cough and phlegm in her throat that has not resolved since her last visit. The phlegm is described as 'real ugly and nasty looking' and sometimes feels like it is in her lungs. No fever is present, but there is occasional shortness of breath, especially in the fall, which is relieved by using her emergency inhaler. She has not taken any over-the-counter cough medication.  She has a history of asthma and continues to use her inhalers.  She is currently taking hydrochlorothiazide  12.5 mg daily for hypertension, metformin  500 mg daily for diabetes, and rosuvastatin  5 mg daily for cholesterol management. Her blood pressure was slightly elevated today. Her A1c was recently checked and is 6.1. She is mindful of her diet and expresses interest in a patch for losing belly  fat.  She mentions the recent loss of her son in a motorcycle accident on September 30th, which has been a significant emotional burden. She has a daughter and three grandchildren and is trying to stay strong for her family. No thoughts of self-harm or harm to others. She lives with her husband and does not drink alcohol, smoke, or use drugs.  She has experienced some muscle pain and 'muscle lock' in her knees, which she describes as painful but manageable.  She has a history of breast issues, noting a 'little leak' that does not occur unless squeezed. No significant changes in her breasts are reported.  Assessment & Plan       Most recent fall risk assessment:    06/03/2024   11:32 AM  Fall Risk   Falls in the past year? 0  Number falls in past yr: 0  Injury with Fall? 0  Risk for fall due to : No Fall Risks  Follow up Falls evaluation completed     Most recent depression screenings:    05/24/2024   11:04 AM 03/19/2024   11:17 AM  PHQ 2/9 Scores  PHQ - 2 Score 0 0  PHQ- 9 Score 0 0        Patient Care Team: Epifanio Labrador R, FNP as PCP - General (Nurse Practitioner) Estelle Service, MD as Obstetrician (Obstetrics and Gynecology) Aneita Gwendlyn DASEN, MD (Inactive) as Consulting Physician (Gastroenterology) Jannis Kate Norris, MD as Consulting Physician (Obstetrics and Gynecology)   Outpatient Medications Prior to Visit  Medication Sig   albuterol  (PROVENTIL ) (2.5 MG/3ML) 0.083% nebulizer solution Take 3 mLs (2.5 mg total) by nebulization every  6 (six) hours as needed for wheezing.   albuterol  (VENTOLIN  HFA) 108 (90 Base) MCG/ACT inhaler Inhale 1-2 puffs into the lungs every 6 (six) hours as needed for wheezing or shortness of breath.   Blood Glucose Monitoring Suppl DEVI 1 each by Does not apply route in the morning and at bedtime. May substitute to any manufacturer covered by patient's insurance.   budesonide -formoterol  (SYMBICORT ) 160-4.5 MCG/ACT inhaler Inhale 2  puffs into the lungs in the morning and at bedtime.   clobetasol  ointment (TEMOVATE ) 0.05 % Apply 1 Application topically 2 (two) times daily. Apply a small amount topically twice daily for up to 2 weeks   diclofenac  Sodium (VOLTAREN  ARTHRITIS PAIN) 1 % GEL Apply 4 g topically 4 (four) times daily.   fluticasone  (FLONASE ) 50 MCG/ACT nasal spray SPRAY 2 SPRAYS INTO EACH NOSTRIL EVERY DAY   gabapentin  (NEURONTIN ) 600 MG tablet Take 1 tablet (600 mg total) by mouth at bedtime.   Glucose Blood (BLOOD GLUCOSE TEST STRIPS) STRP 1 each by In Vitro route in the morning and at bedtime. May substitute to any manufacturer covered by patient's insurance.   hydrochlorothiazide  (HYDRODIURIL ) 12.5 MG tablet TAKE 1 TABLET BY MOUTH EVERY DAY   ibuprofen  (ADVIL ) 800 MG tablet Take 1 tablet (800 mg total) by mouth every 8 (eight) hours as needed.   metFORMIN  (GLUCOPHAGE -XR) 500 MG 24 hr tablet TAKE 2 TABLETS BY MOUTH EVERY DAY WITH BREAKFAST   montelukast  (SINGULAIR ) 10 MG tablet Take 1 tablet (10 mg total) by mouth at bedtime.   Nebulizers (COMPRESSOR/NEBULIZER) MISC As directed   rosuvastatin  (CRESTOR ) 5 MG tablet Take 1 tablet (5 mg total) by mouth daily.   methocarbamol  (ROBAXIN ) 500 MG tablet Take 1 tablet (500 mg total) by mouth 2 (two) times daily. (Patient not taking: Reported on 07/26/2024)   No facility-administered medications prior to visit.    Review of Systems  Constitutional:  Negative for appetite change, chills, fatigue and fever.  HENT:  Negative for congestion, postnasal drip, rhinorrhea and sneezing.   Eyes:  Negative for pain, discharge and itching.  Respiratory:  Positive for cough. Negative for shortness of breath and wheezing.   Cardiovascular:  Negative for chest pain, palpitations and leg swelling.  Gastrointestinal:  Negative for abdominal pain, constipation, nausea and vomiting.  Genitourinary:  Negative for difficulty urinating, dysuria, flank pain and frequency.  Musculoskeletal:   Negative for arthralgias, back pain, joint swelling and myalgias.  Skin:  Negative for color change, pallor, rash and wound.  Neurological:  Negative for facial asymmetry, weakness, numbness and headaches.  Psychiatric/Behavioral:  Negative for behavioral problems, confusion, self-injury and suicidal ideas.        Objective:     BP (!) 140/60   Pulse 68   Wt 266 lb (120.7 kg)   LMP  (LMP Unknown)   SpO2 96%   BMI 44.26 kg/m    Physical Exam Vitals and nursing note reviewed.  Constitutional:      General: She is not in acute distress.    Appearance: Normal appearance. She is obese. She is not ill-appearing, toxic-appearing or diaphoretic.  HENT:     Right Ear: Tympanic membrane, ear canal and external ear normal. There is no impacted cerumen.     Left Ear: Tympanic membrane, ear canal and external ear normal. There is no impacted cerumen.     Nose: Nose normal. No congestion or rhinorrhea.     Mouth/Throat:     Mouth: Mucous membranes are moist.  Pharynx: Oropharynx is clear. No oropharyngeal exudate or posterior oropharyngeal erythema.  Eyes:     General: No scleral icterus.       Right eye: No discharge.        Left eye: No discharge.     Extraocular Movements: Extraocular movements intact.     Conjunctiva/sclera: Conjunctivae normal.  Neck:     Vascular: No carotid bruit.  Cardiovascular:     Rate and Rhythm: Normal rate and regular rhythm.     Pulses: Normal pulses.     Heart sounds: Normal heart sounds. No murmur heard.    No friction rub. No gallop.  Pulmonary:     Effort: Pulmonary effort is normal. No respiratory distress.     Breath sounds: Normal breath sounds. No stridor. No wheezing, rhonchi or rales.  Chest:     Chest wall: No tenderness.  Abdominal:     General: Bowel sounds are normal.     Palpations: Abdomen is soft.     Tenderness: There is no abdominal tenderness. There is no right CVA tenderness, left CVA tenderness, guarding or rebound.      Hernia: No hernia is present.  Musculoskeletal:        General: No swelling, tenderness, deformity or signs of injury.     Cervical back: Normal range of motion and neck supple. No rigidity or tenderness.     Right lower leg: No edema.     Left lower leg: No edema.  Lymphadenopathy:     Cervical: No cervical adenopathy.  Skin:    General: Skin is warm and dry.     Capillary Refill: Capillary refill takes less than 2 seconds.     Coloration: Skin is not jaundiced or pale.     Findings: No bruising, erythema, lesion or rash.  Neurological:     Mental Status: She is alert and oriented to person, place, and time.     Cranial Nerves: No cranial nerve deficit.     Motor: No weakness.     Gait: Gait normal.     Comments: Uses a cane for ambulation  Psychiatric:        Mood and Affect: Mood normal.        Behavior: Behavior normal.        Thought Content: Thought content normal.        Judgment: Judgment normal.     Results for orders placed or performed in visit on 07/26/24  POCT glycosylated hemoglobin (Hb A1C)  Result Value Ref Range   Hemoglobin A1C 6.1 (A) 4.0 - 5.6 %   HbA1c POC (<> result, manual entry)     HbA1c, POC (prediabetic range)     HbA1c, POC (controlled diabetic range)         Assessment & Plan:    Routine Health Maintenance and Physical Exam  Immunization History  Administered Date(s) Administered   Influenza Split 07/09/2011   Influenza Whole 07/31/2012   Influenza, Seasonal, Injecte, Preservative Fre 07/18/2023   Influenza,inj,Quad PF,6+ Mos 07/01/2013, 07/29/2014, 08/25/2015, 06/14/2016, 06/30/2017, 08/30/2021   Influenza-Unspecified 07/27/2019   PFIZER(Purple Top)SARS-COV-2 Vaccination 01/02/2020, 01/23/2020   Tdap 07/09/2011, 03/19/2023    Health Maintenance  Topic Date Due   Hepatitis C Screening  Never done   Pneumococcal Vaccine: 50+ Years (1 of 2 - PCV) Never done   Zoster Vaccines- Shingrix (1 of 2) Never done   COVID-19 Vaccine (3 -  Pfizer risk series) 02/20/2020   Medicare Annual Wellness (AWV)  05/28/2024  Influenza Vaccine  01/11/2025 (Originally 05/14/2024)   HEMOGLOBIN A1C  01/24/2025   OPHTHALMOLOGY EXAM  02/24/2025   Diabetic kidney evaluation - eGFR measurement  03/19/2025   Diabetic kidney evaluation - Urine ACR  05/24/2025   Mammogram  06/11/2025   FOOT EXAM  07/26/2025   Cervical Cancer Screening (HPV/Pap Cotest)  08/19/2025   Colonoscopy  10/18/2025   Fecal DNA (Cologuard)  03/16/2026   DTaP/Tdap/Td (3 - Td or Tdap) 03/18/2033   HIV Screening  Completed   Hepatitis B Vaccines 19-59 Average Risk  Aged Out   HPV VACCINES  Aged Out   Meningococcal B Vaccine  Aged Out    Discussed health benefits of physical activity, and encouraged her to engage in regular exercise appropriate for her age and condition.  Problem List Items Addressed This Visit       Cardiovascular and Mediastinum   HTN (hypertension)   BP Readings from Last 3 Encounters:  07/26/24 (!) 140/60  06/17/24 132/84  05/24/24 111/74   Blood pressure slightly elevated, possibly due to grief. Current regimen effective. - Continue hydrochlorothiazide  12.5 mg daily. Continue current medications. No changes in management. Discussed DASH diet and dietary sodium restrictions Continue to increase dietary efforts and exercise.           Respiratory   Asthma     Managed with albuterol  inhaler 1 to 2 puffs every 6 hours as needed, albuterol  nebulizer 2.5 mg every 6 hours as needed and Symbicort  160-4.5 mcg/ACT 2 puffs twice daily    - Continue albuterol  inhaler and Symbicort  as prescribed. Maintain close follow-up with pulmonology        Acute bronchitis    Persistent cough and phlegm suggest bronchitis. No fever or significant wheezing. Antibiotics considered. - Prescribe azithromycin       Relevant Medications   azithromycin  (ZITHROMAX ) 250 MG tablet     Endocrine   Type 2 diabetes mellitus with hyperglycemia, without long-term  current use of insulin (HCC)   Lab Results  Component Value Date   HGBA1C 6.1 (A) 07/26/2024   Diabetes well-controlled with A1c at 6.1%. - Continue metformin  500 mg daily. - Performed diabetic foot exam.       Relevant Orders   POCT glycosylated hemoglobin (Hb A1C) (Completed)   CBC   Lipid panel   CMP14+EGFR     Other   Dyslipidemia, goal LDL below 70   Lab Results  Component Value Date   CHOL 156 05/24/2024   HDL 64 05/24/2024   LDLCALC 77 05/24/2024   TRIG 81 05/24/2024   CHOLHDL 2.4 05/24/2024   Good tolerance to rosuvastatin . Goal to maintain LDL under 70 mg/dL. - Continue rosuvastatin  5 mg daily - Perform lipid panel.       Annual physical exam - Primary    Annual exam scheduled. Blood pressure slightly elevated, possibly due to grief. - Perform CBC, CMP, and lipid panel. - Encourage regular exercise and a heart-healthy, low-salt, low-fat diet. - Encourage a low-carb diet for diabetes management. - Encourage 7-8 hours of sleep nightly. - Encourage hydration with at least 64 ounces of water daily. Advised patient to get pneumococcal vaccine, shingles vaccine at the pharmacy       Grief at loss of child   Emotional support provided Declined referral for grief counseling      Return in about 4 months (around 11/26/2024) for HTN, DM.     [place] Cawood R Shayra Anton, FNP

## 2024-07-26 NOTE — Assessment & Plan Note (Signed)
  Managed with albuterol  inhaler 1 to 2 puffs every 6 hours as needed, albuterol  nebulizer 2.5 mg every 6 hours as needed and Symbicort  160-4.5 mcg/ACT 2 puffs twice daily   - Continue albuterol  inhaler and Symbicort  as prescribed. Maintain close follow-up with pulmonology

## 2024-07-27 ENCOUNTER — Ambulatory Visit: Payer: Self-pay | Admitting: Nurse Practitioner

## 2024-07-27 DIAGNOSIS — E785 Hyperlipidemia, unspecified: Secondary | ICD-10-CM

## 2024-07-27 LAB — CMP14+EGFR
ALT: 15 IU/L (ref 0–32)
AST: 22 IU/L (ref 0–40)
Albumin: 4.3 g/dL (ref 3.8–4.9)
Alkaline Phosphatase: 68 IU/L (ref 49–135)
BUN/Creatinine Ratio: 18 (ref 12–28)
BUN: 10 mg/dL (ref 8–27)
Bilirubin Total: 0.3 mg/dL (ref 0.0–1.2)
CO2: 24 mmol/L (ref 20–29)
Calcium: 9.5 mg/dL (ref 8.7–10.3)
Chloride: 101 mmol/L (ref 96–106)
Creatinine, Ser: 0.57 mg/dL (ref 0.57–1.00)
Globulin, Total: 2.6 g/dL (ref 1.5–4.5)
Glucose: 87 mg/dL (ref 70–99)
Potassium: 4 mmol/L (ref 3.5–5.2)
Sodium: 141 mmol/L (ref 134–144)
Total Protein: 6.9 g/dL (ref 6.0–8.5)
eGFR: 104 mL/min/1.73 (ref >59)

## 2024-07-27 LAB — LIPID PANEL
Chol/HDL Ratio: 2.7 ratio (ref 0.0–4.4)
Cholesterol, Total: 155 mg/dL (ref 100–199)
HDL: 58 mg/dL
LDL Chol Calc (NIH): 83 mg/dL (ref 0–99)
Triglycerides: 73 mg/dL (ref 0–149)
VLDL Cholesterol Cal: 14 mg/dL (ref 5–40)

## 2024-07-27 LAB — CBC
Hematocrit: 40.4 % (ref 34.0–46.6)
Hemoglobin: 13.1 g/dL (ref 11.1–15.9)
MCH: 29.7 pg (ref 26.6–33.0)
MCHC: 32.4 g/dL (ref 31.5–35.7)
MCV: 92 fL (ref 79–97)
Platelets: 309 x10E3/uL (ref 150–450)
RBC: 4.41 x10E6/uL (ref 3.77–5.28)
RDW: 13.2 % (ref 11.7–15.4)
WBC: 6.3 x10E3/uL (ref 3.4–10.8)

## 2024-07-27 MED ORDER — ROSUVASTATIN CALCIUM 10 MG PO TABS: 10.0000 mg | ORAL_TABLET | Freq: Every day | ORAL | 3 refills | Status: AC

## 2024-08-03 ENCOUNTER — Other Ambulatory Visit: Payer: Self-pay | Admitting: Nurse Practitioner

## 2024-08-03 DIAGNOSIS — M51361 Other intervertebral disc degeneration, lumbar region with lower extremity pain only: Secondary | ICD-10-CM

## 2024-08-03 NOTE — Telephone Encounter (Signed)
 Please advise [facility]

## 2024-08-06 ENCOUNTER — Ambulatory Visit: Admitting: Podiatry

## 2024-08-20 NOTE — Progress Notes (Signed)
 58 y.o. G57P2011 female with OAB here for annual exam. Married. PCP: Paseda, Folashade R, FNP   No LMP recorded (lmp unknown). Patient is postmenopausal.   She reports some clear leakage on right breast earlier this year. Was treated with antibiotic by pcp.  Normal dx MMG March 2025 and then screening MMG Has lessened over time, now only occurs with expression  Vaginal odor x 1 week.   OAB- unsure if meds helped, oxybutynin  rx'd 2023 Urine sample provided: No  Abnormal bleeding: none Pelvic discharge or pain: none Breast mass, nipple discharge or skin changes : none  Sexually active: Yes Birth control: None Last PAP:     Component Value Date/Time   DIAGPAP  08/19/2022 1138    - Negative for intraepithelial lesion or malignancy (NILM)   ADEQPAP  08/19/2022 1138    Satisfactory for evaluation; transformation zone component PRESENT.   Last mammogram: 06/11/24 BIRADS 1, density B Last colonoscopy: 10/19/15 q10y  Exercising: not currently, will start walking soon Smoker: No  Flowsheet Row Office Visit from 08/23/2024 in [facility]  PHQ-2 Total Score 2    Flowsheet Row Office Visit from 08/23/2024 in [facility]  PHQ-9 Total Score 3     GYN HISTORY: No sig hx  OB History  Gravida Para Term Preterm AB Living  3 2 2  1 1   SAB IAB Ectopic Multiple Live Births    1  2    # Outcome Date GA Lbr Len/2nd Weight Sex Type Anes PTL Lv  3 Ectopic           2 Term      Vag-Spont   LIV  1 Term      Vag-Spont   DEC   Past Medical History:  Diagnosis Date   Allergy  2006 not sure   Anxiety 2018   Arthritis 2018   Asthma    Bronchitis    Class 3 severe obesity due to excess calories with serious comorbidity and body mass index (BMI) of 45.0 to 49.9 in adult [facility]) 12/15/2013   COPD (chronic obstructive pulmonary disease) (HCC) 2006 I believe   Depression 2018   Diabetes mellitus without complication (HCC)  [id number]   Shocking and scary   Dyslipidemia, goal LDL below 70 04/29/2023   GERD (gastroesophageal reflux disease) Not sure, certain things i eat   Hypertension 2018   Lumbar degenerative disc disease 06/04/2017   Neuromuscular disorder (HCC) 2018   Obesity    Sleep apnea 2005 or 2006   uses c pap    Vitamin D  deficiency 06/30/2017   Past Surgical History:  Procedure Laterality Date   DENTAL SURGERY     ECTOPIC PREGNANCY SURGERY  2006   Current Outpatient Medications on File Prior to Visit  Medication Sig Dispense Refill   albuterol  (PROVENTIL ) (2.5 MG/3ML) 0.083% nebulizer solution Take 3 mLs (2.5 mg total) by nebulization every 6 (six) hours as needed for wheezing. 75 mL 1   albuterol  (VENTOLIN  HFA) 108 (90 Base) MCG/ACT inhaler Inhale 1-2 puffs into the lungs every 6 (six) hours as needed for wheezing or shortness of breath. 1 each 1   Blood Glucose Monitoring Suppl DEVI 1 each by Does not apply route in the morning and at bedtime. May substitute to any manufacturer covered by patient's insurance. 1 each 0   budesonide -formoterol  (SYMBICORT ) 160-4.5 MCG/ACT inhaler Inhale 2 puffs into the lungs in the morning and at bedtime. 10.2 each 6  clobetasol  ointment (TEMOVATE ) 0.05 % Apply 1 Application topically 2 (two) times daily. Apply a small amount topically twice daily for up to 2 weeks 60 g 0   diclofenac  Sodium (VOLTAREN  ARTHRITIS PAIN) 1 % GEL Apply 4 g topically 4 (four) times daily. 100 g 0   fluticasone  (FLONASE ) 50 MCG/ACT nasal spray SPRAY 2 SPRAYS INTO EACH NOSTRIL EVERY DAY 48 mL 2   gabapentin  (NEURONTIN ) 600 MG tablet TAKE 1 TABLET BY MOUTH AT BEDTIME. 90 tablet 1   Glucose Blood (BLOOD GLUCOSE TEST STRIPS) STRP 1 each by In Vitro route in the morning and at bedtime. May substitute to any manufacturer covered by patient's insurance. 200 strip 5   hydrochlorothiazide  (HYDRODIURIL ) 12.5 MG tablet TAKE 1 TABLET BY MOUTH EVERY DAY 90 tablet 1   ibuprofen  (ADVIL ) 800 MG tablet  Take 1 tablet (800 mg total) by mouth every 8 (eight) hours as needed. 30 tablet 1   metFORMIN  (GLUCOPHAGE -XR) 500 MG 24 hr tablet TAKE 2 TABLETS BY MOUTH EVERY DAY WITH BREAKFAST 180 tablet 1   methocarbamol  (ROBAXIN ) 500 MG tablet Take 1 tablet (500 mg total) by mouth 2 (two) times daily. 20 tablet 0   montelukast  (SINGULAIR ) 10 MG tablet Take 1 tablet (10 mg total) by mouth at bedtime. 30 tablet 3   Nebulizers (COMPRESSOR/NEBULIZER) MISC As directed 1 each 0   rosuvastatin  (CRESTOR ) 10 MG tablet Take 1 tablet (10 mg total) by mouth daily. 60 tablet 3   No current facility-administered medications on file prior to visit.   Social History   Socioeconomic History   Marital status: Married    Spouse name: Not on file   Number of children: 2   Years of education: Not on file   Highest education level: Not on file  Occupational History   Not on file  Tobacco Use   Smoking status: Never   Smokeless tobacco: Never  Vaping Use   Vaping status: Never Used  Substance and Sexual Activity   Alcohol use: Not Currently    Comment: rare   Drug use: No   Sexual activity: Yes    Birth control/protection: Post-menopausal    Comment: >5 sexual partners  Other Topics Concern   Not on file  Social History Narrative   Lives with her husband   Social Drivers of Health   Financial Resource Strain: Medium Risk (05/29/2023)   Overall Financial Resource Strain (CARDIA)    Difficulty of Paying Living Expenses: Somewhat hard  Food Insecurity: Food Insecurity Present (12/19/2023)   Hunger Vital Sign    Worried About Running Out of Food in the Last Year: Sometimes true    Ran Out of Food in the Last Year: Sometimes true  Transportation Needs: No Transportation Needs (05/29/2023)   PRAPARE - Administrator, Civil Service (Medical): No    Lack of Transportation (Non-Medical): No  Physical Activity: Insufficiently Active (05/29/2023)   Exercise Vital Sign    Days of Exercise per Week: 7 days     Minutes of Exercise per Session: 20 min  Stress: No Stress Concern Present (05/29/2023)   [employer] of Occupational Health - Occupational Stress Questionnaire    Feeling of Stress : Not at all  Social Connections: Moderately Integrated (05/29/2023)   Social Connection and Isolation Panel    Frequency of Communication with Friends and Family: More than three times a week    Frequency of Social Gatherings with Friends and Family: More than three times a week  Attends Religious Services: More than 4 times per year    Active Member of Clubs or Organizations: No    Attends Banker Meetings: Never    Marital Status: Married  Catering Manager Violence: Not At Risk (12/18/2023)   Received from [facility]   HITS    Over the last 12 months how often did your partner physically hurt you?: Never    Over the last 12 months how often did your partner insult you or talk down to you?: Never    Over the last 12 months how often did your partner threaten you with physical harm?: Never    Over the last 12 months how often did your partner scream or curse at you?: Never   Family History  Problem Relation Age of Onset   Early death Mother    Hypertension Father    Asthma Father    Arthritis Father    Hypertension Sister    Arthritis Sister    Asthma Sister    COPD Sister    Depression Sister    Miscarriages / Stillbirths Sister    Obesity Sister    Hypertension Sister    Stroke Paternal Grandfather    Hypertension Paternal Grandfather    Arthritis Paternal Grandfather    Asthma Son    Colon cancer Neg Hx    Colon polyps Neg Hx    Esophageal cancer Neg Hx    Rectal cancer Neg Hx    Stomach cancer Neg Hx    Breast cancer Neg Hx    Allergies  Allergen Reactions   Rosuvastatin  Other (See Comments)   Seasonal Ic [Octacosanol]      PE Today's Vitals   08/23/24 1544  BP: 132/78  Pulse: 76  Temp: 98.2 F (36.8 C)  TempSrc: Oral  SpO2: 97%  Weight: 265 lb  (120.2 kg)  Height: 5' 5 (1.651 m)   Body mass index is 44.1 kg/m.  Physical Exam Vitals reviewed. Exam conducted with a chaperone present.  Constitutional:      General: She is not in acute distress.    Appearance: Normal appearance.  HENT:     Head: Normocephalic and atraumatic.     Nose: Nose normal.  Eyes:     Extraocular Movements: Extraocular movements intact.     Conjunctiva/sclera: Conjunctivae normal.  Pulmonary:     Effort: Pulmonary effort is normal.  Chest:     Chest wall: No mass or tenderness.  Breasts:    Right: Normal. No swelling, mass, nipple discharge, skin change or tenderness.     Left: Normal. No swelling, mass, nipple discharge, skin change or tenderness.  Abdominal:     General: There is no distension.     Palpations: Abdomen is soft.     Tenderness: There is no abdominal tenderness.  Genitourinary:    General: Normal vulva.     Exam position: Lithotomy position.     Urethra: No prolapse.     Vagina: Vaginal discharge present. No bleeding.     Cervix: Normal. No lesion.     Uterus: Normal. Not enlarged and not tender.      Adnexa: Right adnexa normal and left adnexa normal.  Musculoskeletal:        General: Normal range of motion.     Cervical back: Normal range of motion.  Lymphadenopathy:     Upper Body:     Right upper body: No axillary adenopathy.     Left upper body: No axillary adenopathy.  Lower Body: No right inguinal adenopathy. No left inguinal adenopathy.  Skin:    General: Skin is warm and dry.  Neurological:     General: No focal deficit present.     Mental Status: She is alert.  Psychiatric:        Mood and Affect: Mood normal.        Behavior: Behavior normal.      Assessment and Plan:        Well woman exam with routine gynecological exam Assessment & Plan: Cervical cancer screening performed according to ASCCP guidelines. Encouraged annual mammogram screening Colonoscopy UTD DXA N/A Labs and immunizations with  her primary Encouraged safe sexual practices as indicated Encouraged healthy lifestyle practices with diet and exercise For patients under 50-70yo, I recommend 1200mg  calcium  daily and 600IU of vitamin D  daily.   Screening for depression  Vaginal discharge -     WET PREP FOR TRICH, YEAST, CLUE  Yeast vaginitis -     Fluconazole ; Take 1 tablet (150 mg total) by mouth once for 1 dose.  Dispense: 1 tablet; Refill: 1  Clear discharge from nipple Symptoms have lessened over time, now only occurs with expression, patient has long ago history of breastfeeding Normal dx MMG March 2025 and then screening MMG Normal exam today Continue to monitor, discouraged expression Plan for annual screening MMG unless symptoms change  Urge incontinence Encouraged kegel exercises  Julia Monroy LULLA Pa, MD

## 2024-08-23 ENCOUNTER — Encounter: Payer: Self-pay | Admitting: Obstetrics and Gynecology

## 2024-08-23 ENCOUNTER — Ambulatory Visit: Payer: Self-pay | Admitting: Obstetrics and Gynecology

## 2024-08-23 ENCOUNTER — Ambulatory Visit (INDEPENDENT_AMBULATORY_CARE_PROVIDER_SITE_OTHER): Admitting: Obstetrics and Gynecology

## 2024-08-23 VITALS — BP 132/78 | HR 76 | Temp 98.2°F | Ht 65.0 in | Wt 265.0 lb

## 2024-08-23 DIAGNOSIS — Z1331 Encounter for screening for depression: Secondary | ICD-10-CM | POA: Diagnosis not present

## 2024-08-23 DIAGNOSIS — N3941 Urge incontinence: Secondary | ICD-10-CM | POA: Diagnosis not present

## 2024-08-23 DIAGNOSIS — N6452 Nipple discharge: Secondary | ICD-10-CM | POA: Diagnosis not present

## 2024-08-23 DIAGNOSIS — Z01419 Encounter for gynecological examination (general) (routine) without abnormal findings: Secondary | ICD-10-CM | POA: Insufficient documentation

## 2024-08-23 DIAGNOSIS — N898 Other specified noninflammatory disorders of vagina: Secondary | ICD-10-CM

## 2024-08-23 DIAGNOSIS — B3731 Acute candidiasis of vulva and vagina: Secondary | ICD-10-CM

## 2024-08-23 LAB — WET PREP FOR TRICH, YEAST, CLUE

## 2024-08-23 MED ORDER — FLUCONAZOLE 150 MG PO TABS: 150.0000 mg | ORAL_TABLET | Freq: Once | ORAL | 1 refills | Status: AC

## 2024-08-23 NOTE — Patient Instructions (Signed)
 For patients under 50-60yo, I recommend 1200mg  calcium  daily and 600IU of vitamin D daily. For patients over 60yo, I recommend 1200mg  calcium  daily and 800IU of vitamin D daily.  Health Maintenance, Female Adopting a healthy lifestyle and getting preventive care are important in promoting health and wellness. Ask your health care provider about: The right schedule for you to have regular tests and exams. Things you can do on your own to prevent diseases and keep yourself healthy. What should I know about diet, weight, and exercise? Eat a healthy diet  Eat a diet that includes plenty of vegetables, fruits, low-fat dairy products, and lean protein. Do not eat a lot of foods that are high in solid fats, added sugars, or sodium. Maintain a healthy weight Body mass index (BMI) is used to identify weight problems. It estimates body fat based on height and weight. Your health care provider can help determine your BMI and help you achieve or maintain a healthy weight. Get regular exercise Get regular exercise. This is one of the most important things you can do for your health. Most adults should: Exercise for at least 150 minutes each week. The exercise should increase your heart rate and make you sweat (moderate-intensity exercise). Do strengthening exercises at least twice a week. This is in addition to the moderate-intensity exercise. Spend less time sitting. Even light physical activity can be beneficial. Watch cholesterol and blood lipids Have your blood tested for lipids and cholesterol at 60 years of age, then have this test every 5 years. Have your cholesterol levels checked more often if: Your lipid or cholesterol levels are high. You are older than 60 years of age. You are at high risk for heart disease. What should I know about cancer screening? Depending on your health history and family history, you may need to have cancer screening at various ages. This may include screening  for: Breast cancer. Cervical cancer. Colorectal cancer. Skin cancer. Lung cancer. What should I know about heart disease, diabetes, and high blood pressure? Blood pressure and heart disease High blood pressure causes heart disease and increases the risk of stroke. This is more likely to develop in people who have high blood pressure readings or are overweight. Have your blood pressure checked: Every 3-5 years if you are 25-57 years of age. Every year if you are 24 years old or older. Diabetes Have regular diabetes screenings. This checks your fasting blood sugar level. Have the screening done: Once every three years after age 62 if you are at a normal weight and have a low risk for diabetes. More often and at a younger age if you are overweight or have a high risk for diabetes. What should I know about preventing infection? Hepatitis B If you have a higher risk for hepatitis B, you should be screened for this virus. Talk with your health care provider to find out if you are at risk for hepatitis B infection. Hepatitis C Testing is recommended for: Everyone born from [birth year] through 1965. Anyone with known risk factors for hepatitis C. Sexually transmitted infections (STIs) Get screened for STIs, including gonorrhea and chlamydia, if: You are sexually active and are younger than 60 years of age. You are older than 60 years of age and your health care provider tells you that you are at risk for this type of infection. Your sexual activity has changed since you were last screened, and you are at increased risk for chlamydia or gonorrhea. Ask your health care provider if  you are at risk. Ask your health care provider about whether you are at high risk for HIV. Your health care provider may recommend a prescription medicine to help prevent HIV infection. If you choose to take medicine to prevent HIV, you should first get tested for HIV. You should then be tested every 3 months for as long as you  are taking the medicine. Osteoporosis and menopause Osteoporosis is a disease in which the bones lose minerals and strength with aging. This can result in bone fractures. If you are 72 years old or older, or if you are at risk for osteoporosis and fractures, ask your health care provider if you should: Be screened for bone loss. Take a calcium  or vitamin D supplement to lower your risk of fractures. Be given hormone replacement therapy (HRT) to treat symptoms of menopause. Follow these instructions at home: Alcohol use Do not drink alcohol if: Your health care provider tells you not to drink. You are pregnant, may be pregnant, or are planning to become pregnant. If you drink alcohol: Limit how much you have to: 0-1 drink a day. Know how much alcohol is in your drink. In the [place] one drink equals one 12 oz bottle of beer (355 mL), one 5 oz glass of wine (148 mL), or one 1 oz glass of hard liquor (44 mL). Lifestyle Do not use any products that contain nicotine or tobacco. These products include cigarettes, chewing tobacco, and vaping devices, such as e-cigarettes. If you need help quitting, ask your health care provider. Do not use street drugs. Do not share needles. Ask your health care provider for help if you need support or information about quitting drugs. General instructions Schedule regular health, dental, and eye exams. Stay current with your vaccines. Tell your health care provider if: You often feel depressed. You have ever been abused or do not feel safe at home. Summary Adopting a healthy lifestyle and getting preventive care are important in promoting health and wellness. Follow your health care provider's instructions about healthy diet, exercising, and getting tested or screened for diseases. Follow your health care provider's instructions on monitoring your cholesterol and blood pressure. This information is not intended to replace advice given to you by your health  care provider. Make sure you discuss any questions you have with your health care provider. Document Revised: 02/19/2021 Document Reviewed: 02/19/2021 Elsevier Patient Education  2024 [employer].

## 2024-08-23 NOTE — Assessment & Plan Note (Signed)
 Cervical cancer screening performed according to ASCCP guidelines. Encouraged annual mammogram screening Colonoscopy UTD DXA N/A Labs and immunizations with her primary Encouraged safe sexual practices as indicated Encouraged healthy lifestyle practices with diet and exercise For patients under 50-60yo, I recommend 1200mg  calcium daily and 600IU of vitamin D daily.

## 2024-08-26 ENCOUNTER — Ambulatory Visit: Admitting: Dietician

## 2024-08-30 ENCOUNTER — Encounter: Payer: Self-pay | Admitting: Dietician

## 2024-08-30 ENCOUNTER — Encounter: Admitting: Dietician

## 2024-08-30 ENCOUNTER — Encounter: Attending: Nurse Practitioner | Admitting: Dietician

## 2024-08-30 VITALS — Wt 267.0 lb

## 2024-08-30 DIAGNOSIS — E119 Type 2 diabetes mellitus without complications: Secondary | ICD-10-CM | POA: Insufficient documentation

## 2024-08-30 NOTE — Progress Notes (Signed)
  Appointment start:  1205        Appointment End:  1235  Eye appointment yesterday which showed no damage. Occasional low carb ice cream.   Son passed in October after a motorcycle accident in October.  Patient attended Diabetes Core Classes 1-3 between 05/20/2023 and 06/03/2023 at [facility]. The purpose of the meeting today is to review information learned during those classes as well as review patient application and goals.   Weight: 65 267 lbs 08/30/2024 265 lbs 02/26/2024.  She states she gained with Holidays' and eating out. 250 lbs fall 2024 305 lbs Spring 2024  Highest adult weight.   What are one or two positive things that you are doing right now to manage your diabetes?  Checking her blood glucose, balanced diet - avoids most sugar   What is the hardest part about your diabetes right now, causing you the most concern, or is the most worrisome to you about your diabetes?  Feels good about it now   What questions do you have today?  Sometimes I get scared when my blood glucose will get too low overnight - discussed that there are not risks for that currently on her current medications   Have you participated in any diabetes support group?  none   History:  Type 2 Diabetes, HLD, HTN, GERD, OSA- c-pap A1C:  6.1% 07/26/2024, 6.2% 11/14/2023, 6% 07/18/2023 decreased from 7.5% 04/29/2023, 14.2% 01/31/2023 Medications include:  Metformin  XR (500 mg/day) Sleep:  good unless in pain Weight:  254 lbs 08/15/2023 decreased from 300 lbs prior to the diabetes diagnosis in 01/2023 Blood Glucose:  still monitors 1-2 times per day             Fasting:   97-108             2 hours after starting a meal:  99-127 q HS.             Have you had any low blood sugar readings in the past month?  none   Social History:  Patient lives with her husband.  She is on disability.  She does most of the cooking and share shopping.  Used to work as a beautician and in an press photographer. Exercise:  walks with a cane. Husband walks with her occasionally.   24 hour diet recall: Breakfast: turkey sausage on 2 slices honey wheat bread Snack:  none Lunch:  Turkey sausage, 2 slices honey Wheat toast, regular sausage, eggs Snack:  corn chips Dinner:  cabbage, white rice, 2 fried chicken legs Snack:  none  Beverages:  water, zero soda, sugar free unsweetened tea     Specific focus but not limited to the following: Review of blood glucose monitoring and interpretation including the recommended target ranges and Hgb A1c.  Review of balanced nutrition.  Add more vegetables, fresh fruit.  Balanced carbohydrate Review of the effects of physical activity on glucose levels and long-term glucose control.  Recommended goal of 150 minutes of physical activity/week. Review of prevention, detection, and treatment of long-term complications.  Discussion of the role of prolonged elevated glucose levels on body systems.   Continuing Goals: Continue to make mindful food choices. Walk or do chair exercises when you feel able.  This helps lower your blood glucose.

## 2024-09-06 ENCOUNTER — Other Ambulatory Visit (HOSPITAL_BASED_OUTPATIENT_CLINIC_OR_DEPARTMENT_OTHER): Payer: Self-pay | Admitting: Adult Health

## 2024-09-16 ENCOUNTER — Encounter: Payer: Self-pay | Admitting: Podiatry

## 2024-09-16 ENCOUNTER — Ambulatory Visit: Admitting: Podiatry

## 2024-09-16 DIAGNOSIS — M79674 Pain in right toe(s): Secondary | ICD-10-CM | POA: Diagnosis not present

## 2024-09-16 DIAGNOSIS — B353 Tinea pedis: Secondary | ICD-10-CM

## 2024-09-16 DIAGNOSIS — M79675 Pain in left toe(s): Secondary | ICD-10-CM

## 2024-09-16 DIAGNOSIS — B351 Tinea unguium: Secondary | ICD-10-CM | POA: Diagnosis not present

## 2024-09-16 DIAGNOSIS — E1140 Type 2 diabetes mellitus with diabetic neuropathy, unspecified: Secondary | ICD-10-CM

## 2024-09-16 MED ORDER — CICLOPIROX OLAMINE 0.77 % EX CREA: TOPICAL_CREAM | Freq: Two times a day (BID) | CUTANEOUS | 1 refills | Status: AC

## 2024-09-16 NOTE — Patient Instructions (Signed)
 Look for urea 40% cream or ointment and apply to the thickened dry skin / calluses. This can be bought over the counter, at a pharmacy or online such as [employer].

## 2024-09-16 NOTE — Progress Notes (Signed)
  Subjective:  Patient ID: [name], female    DOB: 1965-11-01,  MRN: [id number]  Chief Complaint  Patient presents with   RFC    Tinea pedis has improved she is more diligent with cream.  DFC  Diabetic A1c 6.1.  No anti coag    58 y.o. female presents with the above complaint. History confirmed with patient. Patient presenting with pain related to dystrophic thickened elongated nails. Patient is unable to trim own nails related to nail dystrophy and mobility issues. Patient does have a history of T2DM.  Well-controlled.  A1c 6.1.  She notes pretty good relief of her tinea pedis, had been using ciclopirox  cream.  Objective:  Physical Exam: warm, good capillary refill, decreased pedal hair growth nail exam onychomycosis of the toenails, onycholysis, and dystrophic nails greater than 3 mm thickening DP pulses palpable, PT pulses palpable, epicritic sensation to light touch intact, and protective sensation intact Left Foot:  Pain with palpation of nails due to elongation and dystrophic growth.  Right Foot: Pain with palpation of nails due to elongation and dystrophic growth.  Bilateral feet flaky pedal rash somewhat improved from previous  Assessment:   1. Pain due to onychomycosis of toenails of both feet   2. Type 2 diabetes mellitus with diabetic neuropathy, without long-term current use of insulin (HCC)   3. Tinea pedis of both feet      Plan:  Patient was evaluated and treated and all questions answered.   #Onychomycosis with pain  -Nails palliatively debrided as below. -Educated on self-care  Procedure: Nail Debridement Rationale: Pain Type of Debridement: manual, sharp debridement. Instrumentation: Nail nipper, rotary burr. Number of Nails: 10  # Tinea pedis - Overall improved from previous - Continue close monitoring and use of moisturizing creams and lotions - Prescription for ciclopirox  cream sent to patient's pharmacy in the event that she notices  worsening or no improvement despite continued moisturization due to still having some flaking xerotic pedal skin.  # Diabetes with neuropathy Patient educated on diabetes. Discussed proper diabetic foot care and discussed risks and complications of disease. Educated patient in depth on reasons to return to the office immediately should he/she discover anything concerning or new on the feet. All questions answered. Discussed proper shoes as well.    Return in about 3 months (around 12/15/2024) for Diabetic Foot Care.         Ethan Saddler, DPM [facility] / [facility]

## 2024-09-28 ENCOUNTER — Ambulatory Visit: Payer: Self-pay | Admitting: Nurse Practitioner

## 2024-09-28 ENCOUNTER — Encounter: Payer: Self-pay | Admitting: Nurse Practitioner

## 2024-09-28 VITALS — BP 126/79 | HR 78 | Wt 268.4 lb

## 2024-09-28 DIAGNOSIS — E785 Hyperlipidemia, unspecified: Secondary | ICD-10-CM

## 2024-09-28 DIAGNOSIS — Z634 Disappearance and death of family member: Secondary | ICD-10-CM | POA: Diagnosis not present

## 2024-09-28 DIAGNOSIS — M51369 Other intervertebral disc degeneration, lumbar region without mention of lumbar back pain or lower extremity pain: Secondary | ICD-10-CM | POA: Diagnosis not present

## 2024-09-28 DIAGNOSIS — F4321 Adjustment disorder with depressed mood: Secondary | ICD-10-CM | POA: Diagnosis not present

## 2024-09-28 DIAGNOSIS — E1165 Type 2 diabetes mellitus with hyperglycemia: Secondary | ICD-10-CM | POA: Diagnosis not present

## 2024-09-28 DIAGNOSIS — I10 Essential (primary) hypertension: Secondary | ICD-10-CM

## 2024-09-28 DIAGNOSIS — J45909 Unspecified asthma, uncomplicated: Secondary | ICD-10-CM

## 2024-09-28 DIAGNOSIS — J302 Other seasonal allergic rhinitis: Secondary | ICD-10-CM

## 2024-09-28 DIAGNOSIS — Z0000 Encounter for general adult medical examination without abnormal findings: Secondary | ICD-10-CM | POA: Insufficient documentation

## 2024-09-28 MED ORDER — ALBUTEROL SULFATE HFA 108 (90 BASE) MCG/ACT IN AERS: 2.0000 | INHALATION_SPRAY | Freq: Four times a day (QID) | RESPIRATORY_TRACT | 3 refills | Status: AC | PRN

## 2024-09-28 MED ORDER — FLUTICASONE PROPIONATE 50 MCG/ACT NA SUSP: 2.0000 | Freq: Every day | NASAL | 2 refills | Status: AC

## 2024-09-28 MED ORDER — IBUPROFEN 800 MG PO TABS: 800.0000 mg | ORAL_TABLET | Freq: Three times a day (TID) | ORAL | 1 refills | Status: AC | PRN

## 2024-09-28 MED ORDER — BUDESONIDE-FORMOTEROL FUMARATE 160-4.5 MCG/ACT IN AERO: 2.0000 | INHALATION_SPRAY | Freq: Two times a day (BID) | RESPIRATORY_TRACT | 6 refills | Status: AC

## 2024-09-28 MED ORDER — MONTELUKAST SODIUM 10 MG PO TABS: 10.0000 mg | ORAL_TABLET | Freq: Every day | ORAL | 1 refills | Status: AC

## 2024-09-28 NOTE — Assessment & Plan Note (Signed)
°  Intermittent chest tightness relieved by albuterol . Uses albuterol  and nebulizer as needed, Symbicort  daily. - Refilled albuterol  inhaler, inhale 2 puffs every 6 hours as needed for wheezing, shortness of breath - Refilled Symbicort  inhaler 2 puffs twice daily

## 2024-09-28 NOTE — Assessment & Plan Note (Signed)
°  LDL at 83 mg/dL, above target. Discussed CoQ10 for muscle soreness, not indicated for cholesterol management. - Checked cholesterol levels today. - Consider reducing rosuvastatin  to three times a week if muscle soreness persists.

## 2024-09-28 NOTE — Assessment & Plan Note (Signed)
 Controlled on metformin  500 mg daily Continue current medication Patient counseled on low-carb diet

## 2024-09-28 NOTE — Progress Notes (Signed)
 Established Patient Office Visit  Subjective:  Patient ID: [name], female    DOB: Nov 02, 1965  Age: 58 y.o. MRN: [id number]  CC:  Chief Complaint  Patient presents with   Hyperlipidemia    fasting   Headache   Chest Pain    Intermittent, thinks it could be due to stress, lost son back in September     HPI   Discussed the use of AI scribe software for clinical note transcription with the patient, who gave verbal consent to proceed.  History of Present Illness [name] is a 58 year old female   has a past medical history of Allergy  (2006 not sure), Anxiety (2018), Arthritis (2018), Asthma, Bronchitis, Class 3 severe obesity due to excess calories with serious comorbidity and body mass index (BMI) of 45.0 to 49.9 in adult [facility]) (12/15/2013), COPD (chronic obstructive pulmonary disease) (HCC) (2006 I believe), Depression (2018), Diabetes mellitus without complication (HCC) (5/8387975), Dyslipidemia, goal LDL below 70 (04/29/2023), GERD (gastroesophageal reflux disease) (Not sure, certain things i eat), Hypertension (2018), Lumbar degenerative disc disease (06/04/2017), Neuromuscular disorder (HCC) (2018), Obesity, Sleep apnea (2005 or 2006), and Vitamin D  deficiency (06/30/2017). with who presents for follow-up for hyperlipidemia  She experiences intermittent chest pain described as a 'little tight' sensation, often associated with changes in the weather. The pain lasts about an hour and can occur daily. Relief is achieved with her emergency albuterol  inhaler. The pain occurs randomly during the day and is not associated with swelling in her feet.  Using albuterol  inhaler eases off her chest tightness  She has a history of asthma and uses an albuterol  inhaler as needed, typically two puffs, and a nebulizer. She also takes Symbicort , two puffs twice daily. Her asthma symptoms worsen in cold weather, and she reports using her inhaler more frequently during these  times.  She is on multiple medications including hydrochlorothiazide  12.5 mg daily for blood pressure, metformin  500 mg daily for diabetes, and rosuvastatin  10 mg daily for cholesterol, which she started in October. She reports some muscle soreness since starting rosuvastatin , which she associates with previous experiences of muscle pain when discontinuing cholesterol medication.  She experiences 'needles and pins' sensations in her shoulder blade area, which she attributes to nerve issues and manages with gabapentin  as needed. She also reports headaches that she attributes to stress and grief which are not daily and can be alleviated by rest and using Flonase  nasal spray.  She has a history of seasonal allergies and takes Singulair  10 mg at bedtime and uses Flonase  nasal spray. She has not received the shingles or pneumonia vaccines yet but has had the flu and RSV vaccines.  She reports experiencing anxiety and depression due to losing her son recently and prefers not to take medication. She copes with her grief by staying busy and talking to family members.   Assessment and Plan Assessment & Plan     Past Medical History:  Diagnosis Date   Allergy  2006 not sure   Anxiety 2018   Arthritis 2018   Asthma    Bronchitis    Class 3 severe obesity due to excess calories with serious comorbidity and body mass index (BMI) of 45.0 to 49.9 in adult [facility]) 12/15/2013   COPD (chronic obstructive pulmonary disease) (HCC) 2006 I believe   Depression 2018   Diabetes mellitus without complication (HCC) [id number]   Shocking and scary   Dyslipidemia, goal LDL below 70 04/29/2023   GERD (gastroesophageal reflux disease) Not  sure, certain things i eat   Hypertension 2018   Lumbar degenerative disc disease 06/04/2017   Neuromuscular disorder (HCC) 2018   Obesity    Sleep apnea 2005 or 2006   uses c pap    Vitamin D  deficiency 06/30/2017    Past Surgical History:  Procedure Laterality Date   DENTAL  SURGERY     ECTOPIC PREGNANCY SURGERY  2006    Family History  Problem Relation Age of Onset   Early death Mother    Hypertension Father    Asthma Father    Arthritis Father    Hypertension Sister    Arthritis Sister    Asthma Sister    COPD Sister    Depression Sister    Miscarriages / Stillbirths Sister    Obesity Sister    Hypertension Sister    Stroke Paternal Grandfather    Hypertension Paternal Grandfather    Arthritis Paternal Grandfather    Asthma Son    Colon cancer Neg Hx    Colon polyps Neg Hx    Esophageal cancer Neg Hx    Rectal cancer Neg Hx    Stomach cancer Neg Hx    Breast cancer Neg Hx     Social History   Socioeconomic History   Marital status: Married    Spouse name: Not on file   Number of children: 2   Years of education: Not on file   Highest education level: Not on file  Occupational History   Not on file  Tobacco Use   Smoking status: Never   Smokeless tobacco: Never  Vaping Use   Vaping status: Never Used  Substance and Sexual Activity   Alcohol use: Not Currently    Comment: rare   Drug use: No   Sexual activity: Yes    Birth control/protection: Post-menopausal    Comment: >5 sexual partners  Other Topics Concern   Not on file  Social History Narrative   Lives with her husband   Social Drivers of Health   Tobacco Use: Low Risk (09/28/2024)   Patient History    Smoking Tobacco Use: Never    Smokeless Tobacco Use: Never    Passive Exposure: Not on file  Financial Resource Strain: Medium Risk (05/29/2023)   Overall Financial Resource Strain (CARDIA)    Difficulty of Paying Living Expenses: Somewhat hard  Food Insecurity: Food Insecurity Present (12/19/2023)   Hunger Vital Sign    Worried About Running Out of Food in the Last Year: Sometimes true    Ran Out of Food in the Last Year: Sometimes true  Transportation Needs: No Transportation Needs (05/29/2023)   PRAPARE - Administrator, Civil Service (Medical): No     Lack of Transportation (Non-Medical): No  Physical Activity: Insufficiently Active (05/29/2023)   Exercise Vital Sign    Days of Exercise per Week: 7 days    Minutes of Exercise per Session: 20 min  Stress: No Stress Concern Present (05/29/2023)   [employer] of Occupational Health - Occupational Stress Questionnaire    Feeling of Stress : Not at all  Social Connections: Moderately Integrated (05/29/2023)   Social Connection and Isolation Panel    Frequency of Communication with Friends and Family: More than three times a week    Frequency of Social Gatherings with Friends and Family: More than three times a week    Attends Religious Services: More than 4 times per year    Active Member of Clubs or Organizations: No  Attends Banker Meetings: Never    Marital Status: Married  Catering Manager Violence: Not At Risk (12/18/2023)   Received from [facility]   HITS    Over the last 12 months how often did your partner physically hurt you?: Never    Over the last 12 months how often did your partner insult you or talk down to you?: Never    Over the last 12 months how often did your partner threaten you with physical harm?: Never    Over the last 12 months how often did your partner scream or curse at you?: Never  Depression (PHQ2-9): Medium Risk (09/28/2024)   Depression (PHQ2-9)    PHQ-2 Score: 5  Alcohol Screen: Low Risk (05/29/2023)   Alcohol Screen    Last Alcohol Screening Score (AUDIT): 0  Housing: Low Risk (05/29/2023)   Housing    Last Housing Risk Score: 0  Utilities: At Risk (05/29/2023)   AHC Utilities    Threatened with loss of utilities: Yes  Health Literacy: Adequate Health Literacy (05/29/2023)   B1300 Health Literacy    Frequency of need for help with medical instructions: Never    Outpatient Medications Prior to Visit  Medication Sig Dispense Refill   albuterol  (PROVENTIL ) (2.5 MG/3ML) 0.083% nebulizer solution Take 3 mLs (2.5 mg total) by  nebulization every 6 (six) hours as needed for wheezing. 75 mL 1   Blood Glucose Monitoring Suppl DEVI 1 each by Does not apply route in the morning and at bedtime. May substitute to any manufacturer covered by patient's insurance. 1 each 0   ciclopirox  (LOPROX ) 0.77 % cream Apply topically 2 (two) times daily. 30 g 1   clobetasol  ointment (TEMOVATE ) 0.05 % Apply 1 Application topically 2 (two) times daily. Apply a small amount topically twice daily for up to 2 weeks 60 g 0   diclofenac  Sodium (VOLTAREN  ARTHRITIS PAIN) 1 % GEL Apply 4 g topically 4 (four) times daily. 100 g 0   gabapentin  (NEURONTIN ) 600 MG tablet TAKE 1 TABLET BY MOUTH AT BEDTIME. 90 tablet 1   Glucose Blood (BLOOD GLUCOSE TEST STRIPS) STRP 1 each by In Vitro route in the morning and at bedtime. May substitute to any manufacturer covered by patient's insurance. 200 strip 5   hydrochlorothiazide  (HYDRODIURIL ) 12.5 MG tablet TAKE 1 TABLET BY MOUTH EVERY DAY 90 tablet 1   metFORMIN  (GLUCOPHAGE -XR) 500 MG 24 hr tablet TAKE 2 TABLETS BY MOUTH EVERY DAY WITH BREAKFAST 180 tablet 1   methocarbamol  (ROBAXIN ) 500 MG tablet Take 1 tablet (500 mg total) by mouth 2 (two) times daily. 20 tablet 0   Nebulizers (COMPRESSOR/NEBULIZER) MISC As directed 1 each 0   rosuvastatin  (CRESTOR ) 10 MG tablet Take 1 tablet (10 mg total) by mouth daily. 60 tablet 3   albuterol  (VENTOLIN  HFA) 108 (90 Base) MCG/ACT inhaler Inhale 1-2 puffs into the lungs every 6 (six) hours as needed for wheezing or shortness of breath. 1 each 1   budesonide -formoterol  (SYMBICORT ) 160-4.5 MCG/ACT inhaler Inhale 2 puffs into the lungs in the morning and at bedtime. 10.2 each 6   fluticasone  (FLONASE ) 50 MCG/ACT nasal spray SPRAY 2 SPRAYS INTO EACH NOSTRIL EVERY DAY 48 mL 2   ibuprofen  (ADVIL ) 800 MG tablet Take 1 tablet (800 mg total) by mouth every 8 (eight) hours as needed. 30 tablet 1   montelukast  (SINGULAIR ) 10 MG tablet TAKE 1 TABLET BY MOUTH EVERYDAY AT BEDTIME 90 tablet 1    No facility-administered medications prior to  visit.    Allergies[1]  ROS Review of Systems  Constitutional:  Negative for appetite change, chills, fatigue and fever.  HENT:  Negative for congestion.   Respiratory:  Negative for cough, shortness of breath and wheezing.   Cardiovascular:  Negative for chest pain, palpitations and leg swelling.  Gastrointestinal:  Negative for abdominal pain, constipation, nausea and vomiting.  Genitourinary:  Negative for difficulty urinating, dysuria, flank pain and frequency.  Musculoskeletal:  Positive for arthralgias and back pain. Negative for joint swelling and myalgias.  Skin:  Negative for color change, pallor, rash and wound.  Neurological:  Positive for headaches. Negative for facial asymmetry, weakness and numbness.  Psychiatric/Behavioral:  Negative for behavioral problems, confusion, self-injury and suicidal ideas.       Objective:    Physical Exam Vitals and nursing note reviewed.  Constitutional:      General: She is not in acute distress.    Appearance: Normal appearance. She is obese. She is not ill-appearing, toxic-appearing or diaphoretic.  Eyes:     General: No scleral icterus.       Right eye: No discharge.        Left eye: No discharge.     Extraocular Movements: Extraocular movements intact.     Conjunctiva/sclera: Conjunctivae normal.  Cardiovascular:     Rate and Rhythm: Normal rate and regular rhythm.     Pulses: Normal pulses.     Heart sounds: Normal heart sounds. No murmur heard.    No friction rub. No gallop.  Pulmonary:     Effort: Pulmonary effort is normal. No respiratory distress.     Breath sounds: Normal breath sounds. No stridor. No wheezing, rhonchi or rales.  Chest:     Chest wall: No tenderness.  Abdominal:     General: There is no distension.     Palpations: Abdomen is soft.     Tenderness: There is no abdominal tenderness. There is no right CVA tenderness, left CVA tenderness or guarding.   Musculoskeletal:        General: No deformity or signs of injury.     Right lower leg: No edema.     Left lower leg: No edema.  Skin:    General: Skin is warm and dry.     Capillary Refill: Capillary refill takes less than 2 seconds.     Coloration: Skin is not jaundiced or pale.     Findings: No bruising, erythema or lesion.  Neurological:     Mental Status: She is alert and oriented to person, place, and time.     Motor: No weakness.     Coordination: Coordination normal.     Gait: Gait abnormal.     Comments: Uses a cane for ambulation  Psychiatric:        Mood and Affect: Mood normal.        Behavior: Behavior normal.        Thought Content: Thought content normal.        Judgment: Judgment normal.     BP 126/79 (BP Location: Left Arm, Patient Position: Sitting, Cuff Size: Large)   Pulse 78   Wt 268 lb 6.4 oz (121.7 kg)   LMP  (LMP Unknown)   SpO2 95%   BMI 44.66 kg/m  Wt Readings from Last 3 Encounters:  09/28/24 268 lb 6.4 oz (121.7 kg)  08/30/24 267 lb (121.1 kg)  08/23/24 265 lb (120.2 kg)    Lab Results  Component Value Date   TSH 1.42 01/31/2023  Lab Results  Component Value Date   WBC 6.3 07/26/2024   HGB 13.1 07/26/2024   HCT 40.4 07/26/2024   MCV 92 07/26/2024   PLT 309 07/26/2024   Lab Results  Component Value Date   NA 141 07/26/2024   K 4.0 07/26/2024   CO2 24 07/26/2024   GLUCOSE 87 07/26/2024   BUN 10 07/26/2024   CREATININE 0.57 07/26/2024   BILITOT 0.3 07/26/2024   ALKPHOS 68 07/26/2024   AST 22 07/26/2024   ALT 15 07/26/2024   PROT 6.9 07/26/2024   ALBUMIN 4.3 07/26/2024   CALCIUM  9.5 07/26/2024   ANIONGAP 14 01/28/2023   EGFR 104 07/26/2024   GFR 116.24 09/24/2017   Lab Results  Component Value Date   CHOL 155 07/26/2024   Lab Results  Component Value Date   HDL 58 07/26/2024   Lab Results  Component Value Date   LDLCALC 83 07/26/2024   Lab Results  Component Value Date   TRIG 73 07/26/2024   Lab Results   Component Value Date   CHOLHDL 2.7 07/26/2024   Lab Results  Component Value Date   HGBA1C 6.1 (A) 07/26/2024      Assessment & Plan:   Problem List Items Addressed This Visit       Cardiovascular and Mediastinum   HTN (hypertension)   BP Readings from Last 3 Encounters:  09/28/24 126/79  08/23/24 132/78  07/26/24 (!) 140/60   HTN Controlled .  On hydrochlorothiazide  12.5 mg daily Continue current medications. No changes in management. Discussed DASH diet and dietary sodium restrictions Continue to increase dietary efforts and exercise.               Respiratory   Asthma    Intermittent chest tightness relieved by albuterol . Uses albuterol  and nebulizer as needed, Symbicort  daily. - Refilled albuterol  inhaler, inhale 2 puffs every 6 hours as needed for wheezing, shortness of breath - Refilled Symbicort  inhaler 2 puffs twice daily       Relevant Medications   albuterol  (VENTOLIN  HFA) 108 (90 Base) MCG/ACT inhaler   budesonide -formoterol  (SYMBICORT ) 160-4.5 MCG/ACT inhaler   montelukast  (SINGULAIR ) 10 MG tablet   Allergic rhinitis - Primary   Relevant Medications   montelukast  (SINGULAIR ) 10 MG tablet   fluticasone  (FLONASE ) 50 MCG/ACT nasal spray     Endocrine   Type 2 diabetes mellitus with hyperglycemia, without long-term current use of insulin (HCC)   Controlled on metformin  500 mg daily Continue current medication Patient counseled on low-carb diet        Musculoskeletal and Integument   Lumbar degenerative disc disease   Lumbar intervertebral disc degeneration Pins and needles sensation possibly related to lumbar degenerative disc disease. Uses gabapentin  as needed. - Continue gabapentin  300 mg at bedtime as needed. - Refilled ibuprofen  800 mg.       Relevant Medications   ibuprofen  (ADVIL ) 800 MG tablet     Other   Dyslipidemia, goal LDL below 70    LDL at 83 mg/dL, above target. Discussed CoQ10 for muscle soreness, not indicated for  cholesterol management. - Checked cholesterol levels today. - Consider reducing rosuvastatin  to three times a week if muscle soreness persists.       Relevant Orders   Lipid panel   Grief at loss of child   Grief at loss of child Experiencing grief and stress. Positive anxiety and depression screenings. Prefers non-medication management. - Offered referral to grief counseling if desired.  Health care maintenance    Received flu and RSV vaccines. Encouraged shingles and pneumonia vaccines. - Encouraged shingles vaccine. - Encouraged pneumonia vaccine.       Meds ordered this encounter  Medications   albuterol  (VENTOLIN  HFA) 108 (90 Base) MCG/ACT inhaler    Sig: Inhale 2 puffs into the lungs every 6 (six) hours as needed for wheezing or shortness of breath.    Dispense:  1 each    Refill:  3   budesonide -formoterol  (SYMBICORT ) 160-4.5 MCG/ACT inhaler    Sig: Inhale 2 puffs into the lungs in the morning and at bedtime.    Dispense:  10.2 each    Refill:  6    Please substitute with an alternative inhaler that her insurance will cover.  Thanks   ibuprofen  (ADVIL ) 800 MG tablet    Sig: Take 1 tablet (800 mg total) by mouth every 8 (eight) hours as needed.    Dispense:  30 tablet    Refill:  1   montelukast  (SINGULAIR ) 10 MG tablet    Sig: Take 1 tablet (10 mg total) by mouth at bedtime.    Dispense:  90 tablet    Refill:  1   fluticasone  (FLONASE ) 50 MCG/ACT nasal spray    Sig: Place 2 sprays into both nostrils daily.    Dispense:  48 mL    Refill:  2    Follow-up: Return in about 4 months (around 01/27/2025) for HTN, DM, HYPERLIPIDEMIA.    Garyn Arlotta R Lakelyn Straus, FNP     [1]  Allergies Allergen Reactions   Rosuvastatin  Other (See Comments)   Seasonal Ic [Octacosanol]

## 2024-09-28 NOTE — Assessment & Plan Note (Signed)
°  Received flu and RSV vaccines. Encouraged shingles and pneumonia vaccines. - Encouraged shingles vaccine. - Encouraged pneumonia vaccine.

## 2024-09-28 NOTE — Patient Instructions (Signed)

## 2024-09-28 NOTE — Assessment & Plan Note (Signed)
 BP Readings from Last 3 Encounters:  09/28/24 126/79  08/23/24 132/78  07/26/24 (!) 140/60   HTN Controlled .  On hydrochlorothiazide  12.5 mg daily Continue current medications. No changes in management. Discussed DASH diet and dietary sodium restrictions Continue to increase dietary efforts and exercise.

## 2024-09-28 NOTE — Assessment & Plan Note (Signed)
 Seasonal allergic rhinitis Managed with montelukast  10 mg at bedtime and fluticasone  nasal spray daily. - Refilled montelukast . - Refilled fluticasone  nasal spray.

## 2024-09-28 NOTE — Assessment & Plan Note (Signed)
 Lumbar intervertebral disc degeneration Pins and needles sensation possibly related to lumbar degenerative disc disease. Uses gabapentin  as needed. - Continue gabapentin  300 mg at bedtime as needed. - Refilled ibuprofen  800 mg.

## 2024-09-28 NOTE — Assessment & Plan Note (Signed)
 Grief at loss of child Experiencing grief and stress. Positive anxiety and depression screenings. Prefers non-medication management. - Offered referral to grief counseling if desired.

## 2024-09-29 ENCOUNTER — Ambulatory Visit: Payer: Self-pay | Admitting: Nurse Practitioner

## 2024-09-29 LAB — LIPID PANEL
Chol/HDL Ratio: 2.5 ratio (ref 0.0–4.4)
Cholesterol, Total: 146 mg/dL (ref 100–199)
HDL: 59 mg/dL (ref >39)
LDL Chol Calc (NIH): 71 mg/dL (ref 0–99)
Triglycerides: 87 mg/dL (ref 0–149)
VLDL Cholesterol Cal: 16 mg/dL (ref 5–40)

## 2024-10-08 ENCOUNTER — Other Ambulatory Visit: Payer: Self-pay | Admitting: Nurse Practitioner

## 2024-11-26 ENCOUNTER — Ambulatory Visit: Payer: Self-pay | Admitting: Nurse Practitioner

## 2024-12-16 ENCOUNTER — Ambulatory Visit: Admitting: Podiatry

## 2025-01-28 ENCOUNTER — Ambulatory Visit: Payer: Self-pay | Admitting: Nurse Practitioner

## 2025-08-24 ENCOUNTER — Ambulatory Visit: Admitting: Obstetrics and Gynecology
